# Patient Record
Sex: Female | Born: 1950
Health system: Southern US, Community
[De-identification: ages and names within clinical notes are randomized; demographics above are authoritative.]

## PROBLEM LIST (undated history)

## (undated) DIAGNOSIS — Z9289 Personal history of other medical treatment: Secondary | ICD-10-CM

## (undated) DIAGNOSIS — E039 Hypothyroidism, unspecified: Secondary | ICD-10-CM

## (undated) DIAGNOSIS — K219 Gastro-esophageal reflux disease without esophagitis: Secondary | ICD-10-CM

## (undated) DIAGNOSIS — I2510 Atherosclerotic heart disease of native coronary artery without angina pectoris: Secondary | ICD-10-CM

## (undated) DIAGNOSIS — F419 Anxiety disorder, unspecified: Secondary | ICD-10-CM

## (undated) DIAGNOSIS — K449 Diaphragmatic hernia without obstruction or gangrene: Secondary | ICD-10-CM

## (undated) DIAGNOSIS — I10 Essential (primary) hypertension: Secondary | ICD-10-CM

## (undated) DIAGNOSIS — M1990 Unspecified osteoarthritis, unspecified site: Secondary | ICD-10-CM

## (undated) DIAGNOSIS — G4730 Sleep apnea, unspecified: Secondary | ICD-10-CM

## (undated) DIAGNOSIS — I429 Cardiomyopathy, unspecified: Secondary | ICD-10-CM

## (undated) DIAGNOSIS — R55 Syncope and collapse: Secondary | ICD-10-CM

## (undated) DIAGNOSIS — R001 Bradycardia, unspecified: Secondary | ICD-10-CM

## (undated) DIAGNOSIS — E785 Hyperlipidemia, unspecified: Secondary | ICD-10-CM

## (undated) HISTORY — DX: Anxiety disorder, unspecified: F41.9

## (undated) HISTORY — DX: Personal history of other medical treatment: Z92.89

## (undated) HISTORY — DX: Cardiomyopathy, unspecified: I42.9

## (undated) HISTORY — DX: Hypothyroidism, unspecified: E03.9

## (undated) HISTORY — PX: ABDOMINAL HYSTERECTOMY: SHX81

## (undated) HISTORY — PX: CHOLECYSTECTOMY: SHX55

## (undated) HISTORY — DX: Bradycardia, unspecified: R00.1

## (undated) HISTORY — DX: Essential (primary) hypertension: I10

## (undated) HISTORY — DX: Hyperlipidemia, unspecified: E78.5

## (undated) HISTORY — DX: Diaphragmatic hernia without obstruction or gangrene: K44.9

## (undated) HISTORY — DX: Syncope and collapse: R55

## (undated) HISTORY — DX: Atherosclerotic heart disease of native coronary artery without angina pectoris: I25.10

---

## 1996-01-10 HISTORY — PX: TOTAL VAGINAL HYSTERECTOMY: SHX2548

## 1996-01-10 HISTORY — PX: LUMBAR DISC SURGERY: SHX700

## 1998-03-10 ENCOUNTER — Ambulatory Visit (HOSPITAL_COMMUNITY): Admission: RE | Admit: 1998-03-10 | Discharge: 1998-03-10 | Payer: Self-pay | Admitting: Family Medicine

## 1998-04-02 ENCOUNTER — Ambulatory Visit (HOSPITAL_COMMUNITY): Admission: RE | Admit: 1998-04-02 | Discharge: 1998-04-03 | Payer: Self-pay | Admitting: Neurosurgery

## 1998-04-02 ENCOUNTER — Encounter: Payer: Self-pay | Admitting: Neurosurgery

## 1998-04-23 ENCOUNTER — Encounter: Payer: Self-pay | Admitting: Neurosurgery

## 1998-04-23 ENCOUNTER — Ambulatory Visit (HOSPITAL_COMMUNITY): Admission: RE | Admit: 1998-04-23 | Discharge: 1998-04-23 | Payer: Self-pay | Admitting: Neurosurgery

## 2004-10-05 ENCOUNTER — Emergency Department: Payer: Self-pay | Admitting: Emergency Medicine

## 2005-08-05 ENCOUNTER — Ambulatory Visit: Payer: Self-pay | Admitting: Internal Medicine

## 2006-01-09 HISTORY — PX: CARPAL TUNNEL RELEASE: SHX101

## 2006-07-03 ENCOUNTER — Ambulatory Visit: Payer: Self-pay | Admitting: Internal Medicine

## 2006-09-26 ENCOUNTER — Encounter: Payer: Self-pay | Admitting: Orthopedic Surgery

## 2006-10-10 ENCOUNTER — Encounter: Payer: Self-pay | Admitting: Orthopedic Surgery

## 2006-11-07 ENCOUNTER — Ambulatory Visit: Payer: Self-pay | Admitting: Internal Medicine

## 2006-11-10 ENCOUNTER — Encounter: Payer: Self-pay | Admitting: Orthopedic Surgery

## 2006-12-28 ENCOUNTER — Ambulatory Visit: Payer: Self-pay | Admitting: Internal Medicine

## 2007-07-15 ENCOUNTER — Ambulatory Visit: Payer: Self-pay | Admitting: Internal Medicine

## 2007-07-29 ENCOUNTER — Ambulatory Visit: Payer: Self-pay | Admitting: Orthopedic Surgery

## 2007-08-07 ENCOUNTER — Ambulatory Visit: Payer: Self-pay | Admitting: Orthopedic Surgery

## 2007-09-09 ENCOUNTER — Encounter: Payer: Self-pay | Admitting: Orthopedic Surgery

## 2007-09-10 ENCOUNTER — Encounter: Payer: Self-pay | Admitting: Orthopedic Surgery

## 2007-10-10 ENCOUNTER — Encounter: Payer: Self-pay | Admitting: Orthopedic Surgery

## 2007-11-10 ENCOUNTER — Encounter: Payer: Self-pay | Admitting: Orthopedic Surgery

## 2008-02-08 ENCOUNTER — Inpatient Hospital Stay: Payer: Self-pay | Admitting: Internal Medicine

## 2008-02-18 ENCOUNTER — Ambulatory Visit: Payer: Self-pay | Admitting: Internal Medicine

## 2008-10-03 ENCOUNTER — Emergency Department: Payer: Self-pay | Admitting: Emergency Medicine

## 2009-03-01 ENCOUNTER — Ambulatory Visit: Payer: Self-pay | Admitting: Rheumatology

## 2009-03-22 ENCOUNTER — Other Ambulatory Visit: Payer: Self-pay | Admitting: Internal Medicine

## 2009-08-09 ENCOUNTER — Other Ambulatory Visit: Payer: Self-pay | Admitting: Internal Medicine

## 2009-11-15 ENCOUNTER — Ambulatory Visit: Payer: Self-pay | Admitting: Gastroenterology

## 2009-11-17 LAB — PATHOLOGY REPORT

## 2010-07-14 ENCOUNTER — Emergency Department: Payer: Self-pay | Admitting: Emergency Medicine

## 2010-07-20 ENCOUNTER — Encounter: Payer: Self-pay | Admitting: Cardiology

## 2010-07-20 ENCOUNTER — Encounter: Payer: Self-pay | Admitting: *Deleted

## 2010-07-20 ENCOUNTER — Ambulatory Visit (INDEPENDENT_AMBULATORY_CARE_PROVIDER_SITE_OTHER): Payer: Medicare Other | Admitting: Cardiology

## 2010-07-20 VITALS — BP 134/82 | HR 62 | Ht 66.0 in | Wt 195.0 lb

## 2010-07-20 DIAGNOSIS — R079 Chest pain, unspecified: Secondary | ICD-10-CM

## 2010-07-20 DIAGNOSIS — M542 Cervicalgia: Secondary | ICD-10-CM

## 2010-07-20 DIAGNOSIS — I10 Essential (primary) hypertension: Secondary | ICD-10-CM | POA: Insufficient documentation

## 2010-07-20 NOTE — Assessment & Plan Note (Addendum)
Patient had an episode of severe neck pain associated with nausea/vomiting and diaphoresis.  It is possible that this episode may have been vasovagal as she had been standing for a few minutes.   However, the patient has no history of vasovagal symptoms.  Cardiac risk factors include HTN and hyperlipidemia.   I will get an ETT-myoview to risk stratify and hopefully rule out ischemia.  She will continue aspirin for now.

## 2010-07-20 NOTE — Patient Instructions (Signed)
You have been scheduled for a Treadmill Myoview at [facility]. Please refer to your instructions given in office.  Your physician recommends that you schedule a follow-up appointment in: 2 weeks with Dr. Mariah Milling

## 2010-07-20 NOTE — Progress Notes (Signed)
PCP: Cornelia Copa  60 yo with history of HTN and hyperlipidemia was sent for evaluation today after recent ER evaluation at [facility].  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  Her left arm tingled.  She had never had a sensation like this before. She was not lightheaded.  She did not pass out or feel palpitations.  No fever, diarrhea, or overt dehydration.  She was indoors and had been standing  In one place for less than 5 minutes.  The symptom complex lasted for 2-3 hours.  She went to the ER at [facility].  Evaluation including cardiac enyzmes and ECG was negative and she was sent home.    She has had no recurrence of the symptoms taking her to the ER.  She has good exercise tolerance in general and is able to weedeat, clean houses, and do moderate physical exertion without significant dyspnea or chest pain.   She is mildly short of breath if she climbs fast up a long flight of steps.  Patient does report a stress test 3-4 years ago in Dr. Fredna Dow office that she says was normal.   ECG: NSR, normal  Labs (7/12): K 4.7, creatinine 0.84, TSH normal, cardiac enzymes negative  PMH:  1. GERD 2. Hypothyroidism 3. HTN 4. Hyperlipidemia 5. OSA 6. Disc surgery 7. Hysterectomy 8. Cholecystectomy  SH: Lives in [place] with husband.  Nonsmoker, no ETOH.  She works full-time as a Advertising copywriter.   FH: Grandfather with gastric cancer, father with PCI (? Age)  ROS: All systems reviewed and negative except as per HPI.   Current Outpatient Prescriptions  Medication Sig Dispense Refill  . ALPRAZolam (XANAX) 0.5 MG tablet Take 0.5 mg by mouth at bedtime as needed.        Marland Kitchen aspirin 81 MG tablet Take 81 mg by mouth daily.        Marland Kitchen CALCIUM-MAGNESIUM-ZINC PO Take 2 tablets by mouth daily.        Marland Kitchen estrogen-methylTESTOSTERone (ESTRATEST HS) 0.625-1.25 MG per tablet Take 1 tablet by mouth  daily.        Marland Kitchen FLUoxetine (PROZAC) 20 MG capsule Take 1 tablet by mouth daily.      . furosemide (LASIX) 20 MG tablet Take 1 tablet by mouth daily.      Marland Kitchen levothyroxine (SYNTHROID, LEVOTHROID) 50 MCG tablet Take 1 tablet by mouth daily.      Marland Kitchen lisinopril (PRINIVIL,ZESTRIL) 10 MG tablet Take 1 tablet by mouth daily.      . meloxicam (MOBIC) 7.5 MG tablet Take 1 tablet by mouth daily.      . rosuvastatin (CRESTOR) 10 MG tablet Take 10 mg by mouth daily.          BP 134/82  Pulse 62  Ht 5\' 6"  (1.676 m)  Wt 195 lb (88.451 kg)  BMI 31.47 kg/m2 General: NAD Neck: No JVD, no thyromegaly or thyroid nodule.  Lungs: Clear to auscultation bilaterally with normal respiratory effort. CV: Nondisplaced PMI.  Heart regular S1/S2, no S3/S4, no murmur.  No peripheral edema.  No carotid bruit.  Normal pedal pulses.  Abdomen: Soft, nontender, no hepatosplenomegaly, no distention.  Skin: Intact without lesions or rashes.  Neurologic: Alert and oriented x 3.  Psych: Normal affect. Extremities: No clubbing or cyanosis.  HEENT: Normal.

## 2010-07-20 NOTE — Assessment & Plan Note (Signed)
BP seems reasonably well-controlled on lisinopril.

## 2010-07-21 ENCOUNTER — Encounter: Payer: Self-pay | Admitting: *Deleted

## 2010-07-25 ENCOUNTER — Other Ambulatory Visit: Payer: Self-pay | Admitting: Cardiovascular Disease

## 2010-07-25 ENCOUNTER — Telehealth: Payer: Self-pay | Admitting: *Deleted

## 2010-07-25 ENCOUNTER — Ambulatory Visit: Payer: Self-pay | Admitting: Cardiovascular Disease

## 2010-07-25 DIAGNOSIS — I10 Essential (primary) hypertension: Secondary | ICD-10-CM

## 2010-07-25 DIAGNOSIS — M542 Cervicalgia: Secondary | ICD-10-CM

## 2010-07-25 NOTE — Telephone Encounter (Signed)
Pt's insurance will not cover pt's myoview that was scheduled at [facility]. Pt will need either peer review or ETT done prior to her insurance authorizing myoview. Dr. Shirlee Latch ordered test, but pt will f/u with Dr. Mariah Milling afterwards, so per TG pt will have ETT in office scheduled. Notified pt's daughter Morrie Sheldon), and will schedule for this week.

## 2010-08-02 ENCOUNTER — Ambulatory Visit: Payer: Medicare Other | Admitting: Cardiovascular Disease

## 2010-08-02 ENCOUNTER — Other Ambulatory Visit: Payer: Self-pay

## 2010-08-02 DIAGNOSIS — R0602 Shortness of breath: Secondary | ICD-10-CM

## 2010-08-02 DIAGNOSIS — M542 Cervicalgia: Secondary | ICD-10-CM

## 2010-08-03 ENCOUNTER — Ambulatory Visit: Payer: Medicare Other | Admitting: Cardiovascular Disease

## 2010-08-04 ENCOUNTER — Telehealth: Payer: Self-pay | Admitting: *Deleted

## 2010-08-04 NOTE — Telephone Encounter (Signed)
Opened in error

## 2010-08-08 ENCOUNTER — Ambulatory Visit: Payer: Self-pay | Admitting: Cardiovascular Disease

## 2010-08-08 DIAGNOSIS — R079 Chest pain, unspecified: Secondary | ICD-10-CM

## 2010-08-11 ENCOUNTER — Telehealth: Payer: Self-pay | Admitting: *Deleted

## 2010-08-11 NOTE — Telephone Encounter (Signed)
Pt notified stress myoview was normal per Dr. Mariah Milling.

## 2010-08-23 ENCOUNTER — Encounter: Payer: Self-pay | Admitting: *Deleted

## 2010-10-12 ENCOUNTER — Ambulatory Visit: Payer: Self-pay | Admitting: Internal Medicine

## 2010-10-24 ENCOUNTER — Other Ambulatory Visit: Payer: Self-pay | Admitting: Internal Medicine

## 2010-12-20 ENCOUNTER — Ambulatory Visit: Payer: Self-pay | Admitting: Internal Medicine

## 2011-06-16 ENCOUNTER — Ambulatory Visit: Payer: Self-pay | Admitting: Internal Medicine

## 2011-06-16 LAB — CBC WITH DIFFERENTIAL/PLATELET
Basophil #: 0.1 10*3/uL (ref 0.0–0.1)
Basophil %: 0.9 %
Eosinophil #: 0.2 10*3/uL (ref 0.0–0.7)
Eosinophil %: 3.4 %
HCT: 40.2 % (ref 35.0–47.0)
HGB: 13.4 g/dL (ref 12.0–16.0)
Lymphocyte #: 1.5 10*3/uL (ref 1.0–3.6)
Lymphocyte %: 26.3 %
MCH: 32.9 pg (ref 26.0–34.0)
MCHC: 33.3 g/dL (ref 32.0–36.0)
MCV: 99 fL (ref 80–100)
Monocyte #: 0.5 x10 3/mm (ref 0.2–0.9)
Monocyte %: 8.7 %
Neutrophil #: 3.5 10*3/uL (ref 1.4–6.5)
Neutrophil %: 60.7 %
Platelet: 146 10*3/uL — ABNORMAL LOW (ref 150–440)
RBC: 4.07 10*6/uL (ref 3.80–5.20)
RDW: 13.7 % (ref 11.5–14.5)
WBC: 5.8 10*3/uL (ref 3.6–11.0)

## 2011-06-16 LAB — COMPREHENSIVE METABOLIC PANEL
Albumin: 3.9 g/dL (ref 3.4–5.0)
Alkaline Phosphatase: 99 U/L (ref 50–136)
Anion Gap: 9 (ref 7–16)
BUN: 15 mg/dL (ref 7–18)
Bilirubin,Total: 0.6 mg/dL (ref 0.2–1.0)
Calcium, Total: 8.9 mg/dL (ref 8.5–10.1)
Chloride: 106 mmol/L (ref 98–107)
Co2: 28 mmol/L (ref 21–32)
Creatinine: 0.93 mg/dL (ref 0.60–1.30)
EGFR (African American): >60
EGFR (Non-African Amer.): >60
Glucose: 86 mg/dL (ref 65–99)
Osmolality: 285 (ref 275–301)
Potassium: 4.3 mmol/L (ref 3.5–5.1)
SGOT(AST): 30 U/L (ref 15–37)
SGPT (ALT): 41 U/L
Sodium: 143 mmol/L (ref 136–145)
Total Protein: 7.3 g/dL (ref 6.4–8.2)

## 2011-06-16 LAB — URINALYSIS, COMPLETE
Bacteria: NONE SEEN
Bilirubin,UR: NEGATIVE
Blood: NEGATIVE
Glucose,UR: NEGATIVE mg/dL (ref 0–75)
Ketone: NEGATIVE
Nitrite: NEGATIVE
Ph: 6 (ref 4.5–8.0)
Protein: NEGATIVE
RBC,UR: 1 /HPF (ref 0–5)
Specific Gravity: 1.012 (ref 1.003–1.030)
Squamous Epithelial: 6
Transitional Epi: <1
WBC UR: 1 /HPF (ref 0–5)

## 2011-06-16 LAB — TSH: Thyroid Stimulating Horm: 5.47 u[IU]/mL — ABNORMAL HIGH

## 2011-06-17 LAB — URINE CULTURE

## 2011-08-23 ENCOUNTER — Other Ambulatory Visit: Payer: Self-pay | Admitting: Internal Medicine

## 2011-08-23 LAB — LIPID PANEL
Cholesterol: 193 mg/dL (ref 0–200)
HDL Cholesterol: 55 mg/dL (ref 40–60)
Ldl Cholesterol, Calc: 114 mg/dL — ABNORMAL HIGH (ref 0–100)
Triglycerides: 121 mg/dL (ref 0–200)
VLDL Cholesterol, Calc: 24 mg/dL (ref 5–40)

## 2011-12-25 ENCOUNTER — Ambulatory Visit: Payer: Self-pay | Admitting: Internal Medicine

## 2012-01-26 ENCOUNTER — Ambulatory Visit: Payer: Self-pay | Admitting: Specialist

## 2012-02-28 ENCOUNTER — Emergency Department: Payer: Self-pay | Admitting: Emergency Medicine

## 2012-02-28 LAB — CBC
HGB: 14 g/dL (ref 12.0–16.0)
MCH: 31.4 pg (ref 26.0–34.0)
MCV: 95 fL (ref 80–100)
Platelet: 186 10*3/uL (ref 150–440)
RBC: 4.46 10*6/uL (ref 3.80–5.20)
RDW: 13.8 % (ref 11.5–14.5)
WBC: 9.7 10*3/uL (ref 3.6–11.0)

## 2012-02-28 LAB — CK TOTAL AND CKMB (NOT AT ARMC): CK-MB: <0.5 ng/mL — ABNORMAL LOW (ref 0.5–3.6)

## 2012-02-28 LAB — BASIC METABOLIC PANEL
Anion Gap: 8 (ref 7–16)
BUN: 17 mg/dL (ref 7–18)
Creatinine: 1.1 mg/dL (ref 0.60–1.30)
EGFR (African American): >60
EGFR (Non-African Amer.): 54 — ABNORMAL LOW
Glucose: 94 mg/dL (ref 65–99)
Potassium: 4.5 mmol/L (ref 3.5–5.1)
Sodium: 139 mmol/L (ref 136–145)

## 2012-09-16 ENCOUNTER — Ambulatory Visit: Payer: Self-pay | Admitting: Internal Medicine

## 2012-09-16 LAB — BASIC METABOLIC PANEL
Anion Gap: 3 — ABNORMAL LOW (ref 7–16)
BUN: 7 mg/dL (ref 7–18)
Calcium, Total: 9.2 mg/dL (ref 8.5–10.1)
Chloride: 107 mmol/L (ref 98–107)
Co2: 30 mmol/L (ref 21–32)
Creatinine: 0.78 mg/dL (ref 0.60–1.30)
EGFR (African American): >60
EGFR (Non-African Amer.): >60
Glucose: 108 mg/dL — ABNORMAL HIGH (ref 65–99)
Osmolality: 278 (ref 275–301)
Potassium: 4 mmol/L (ref 3.5–5.1)
Sodium: 140 mmol/L (ref 136–145)

## 2012-09-16 LAB — CBC WITH DIFFERENTIAL/PLATELET
Basophil #: 0.1 10*3/uL (ref 0.0–0.1)
Basophil %: 1.1 %
Eosinophil #: 0.2 10*3/uL (ref 0.0–0.7)
Eosinophil %: 3.3 %
HCT: 38.8 % (ref 35.0–47.0)
HGB: 13.4 g/dL (ref 12.0–16.0)
Lymphocyte #: 1.5 10*3/uL (ref 1.0–3.6)
Lymphocyte %: 24.9 %
MCH: 32.4 pg (ref 26.0–34.0)
MCHC: 34.5 g/dL (ref 32.0–36.0)
MCV: 94 fL (ref 80–100)
Monocyte #: 0.5 x10 3/mm (ref 0.2–0.9)
Monocyte %: 8 %
Neutrophil #: 3.8 10*3/uL (ref 1.4–6.5)
Neutrophil %: 62.7 %
Platelet: 156 10*3/uL (ref 150–440)
RBC: 4.13 10*6/uL (ref 3.80–5.20)
RDW: 13.4 % (ref 11.5–14.5)
WBC: 6.1 10*3/uL (ref 3.6–11.0)

## 2012-09-16 LAB — TROPONIN I: Troponin-I: 0.04 ng/mL

## 2012-09-16 LAB — PRO B NATRIURETIC PEPTIDE: B-Type Natriuretic Peptide: 137 pg/mL — ABNORMAL HIGH (ref 0–125)

## 2012-09-16 LAB — CK: CK, Total: 688 U/L — ABNORMAL HIGH (ref 21–215)

## 2012-11-22 ENCOUNTER — Ambulatory Visit: Payer: Self-pay | Admitting: Internal Medicine

## 2013-11-14 ENCOUNTER — Ambulatory Visit: Payer: Self-pay | Admitting: Internal Medicine

## 2014-01-16 DIAGNOSIS — M5417 Radiculopathy, lumbosacral region: Secondary | ICD-10-CM | POA: Diagnosis not present

## 2014-01-16 DIAGNOSIS — M62830 Muscle spasm of back: Secondary | ICD-10-CM | POA: Diagnosis not present

## 2014-01-16 DIAGNOSIS — M5136 Other intervertebral disc degeneration, lumbar region: Secondary | ICD-10-CM | POA: Insufficient documentation

## 2014-01-16 DIAGNOSIS — M5412 Radiculopathy, cervical region: Secondary | ICD-10-CM | POA: Diagnosis not present

## 2014-01-16 DIAGNOSIS — M47816 Spondylosis without myelopathy or radiculopathy, lumbar region: Secondary | ICD-10-CM | POA: Insufficient documentation

## 2014-01-16 DIAGNOSIS — M51369 Other intervertebral disc degeneration, lumbar region without mention of lumbar back pain or lower extremity pain: Secondary | ICD-10-CM | POA: Insufficient documentation

## 2014-01-17 DIAGNOSIS — M62830 Muscle spasm of back: Secondary | ICD-10-CM | POA: Insufficient documentation

## 2014-03-27 DIAGNOSIS — K219 Gastro-esophageal reflux disease without esophagitis: Secondary | ICD-10-CM | POA: Diagnosis not present

## 2014-03-27 DIAGNOSIS — H2513 Age-related nuclear cataract, bilateral: Secondary | ICD-10-CM | POA: Diagnosis not present

## 2014-03-27 DIAGNOSIS — Z0000 Encounter for general adult medical examination without abnormal findings: Secondary | ICD-10-CM | POA: Diagnosis not present

## 2014-03-27 DIAGNOSIS — F064 Anxiety disorder due to known physiological condition: Secondary | ICD-10-CM | POA: Diagnosis not present

## 2014-03-27 DIAGNOSIS — E8881 Metabolic syndrome: Secondary | ICD-10-CM | POA: Diagnosis not present

## 2014-04-10 DIAGNOSIS — Z1231 Encounter for screening mammogram for malignant neoplasm of breast: Secondary | ICD-10-CM | POA: Diagnosis not present

## 2014-04-22 DIAGNOSIS — G4733 Obstructive sleep apnea (adult) (pediatric): Secondary | ICD-10-CM | POA: Diagnosis not present

## 2014-05-13 DIAGNOSIS — M5417 Radiculopathy, lumbosacral region: Secondary | ICD-10-CM | POA: Diagnosis not present

## 2014-05-13 DIAGNOSIS — M5412 Radiculopathy, cervical region: Secondary | ICD-10-CM | POA: Diagnosis not present

## 2014-05-13 DIAGNOSIS — M5136 Other intervertebral disc degeneration, lumbar region: Secondary | ICD-10-CM | POA: Diagnosis not present

## 2014-05-14 DIAGNOSIS — M5136 Other intervertebral disc degeneration, lumbar region: Secondary | ICD-10-CM | POA: Diagnosis not present

## 2014-05-14 DIAGNOSIS — M5417 Radiculopathy, lumbosacral region: Secondary | ICD-10-CM | POA: Diagnosis not present

## 2014-05-22 DIAGNOSIS — L820 Inflamed seborrheic keratosis: Secondary | ICD-10-CM | POA: Diagnosis not present

## 2014-05-22 DIAGNOSIS — G4733 Obstructive sleep apnea (adult) (pediatric): Secondary | ICD-10-CM | POA: Diagnosis not present

## 2014-05-22 DIAGNOSIS — L280 Lichen simplex chronicus: Secondary | ICD-10-CM | POA: Diagnosis not present

## 2014-06-19 DIAGNOSIS — M5416 Radiculopathy, lumbar region: Secondary | ICD-10-CM | POA: Diagnosis not present

## 2014-06-19 DIAGNOSIS — M5136 Other intervertebral disc degeneration, lumbar region: Secondary | ICD-10-CM | POA: Diagnosis not present

## 2014-06-22 DIAGNOSIS — G4733 Obstructive sleep apnea (adult) (pediatric): Secondary | ICD-10-CM | POA: Diagnosis not present

## 2014-07-17 ENCOUNTER — Other Ambulatory Visit: Payer: Self-pay | Admitting: Physical Medicine and Rehabilitation

## 2014-07-17 DIAGNOSIS — M5136 Other intervertebral disc degeneration, lumbar region: Secondary | ICD-10-CM | POA: Diagnosis not present

## 2014-07-17 DIAGNOSIS — M5412 Radiculopathy, cervical region: Secondary | ICD-10-CM | POA: Diagnosis not present

## 2014-07-17 DIAGNOSIS — M5416 Radiculopathy, lumbar region: Secondary | ICD-10-CM | POA: Diagnosis not present

## 2014-07-22 DIAGNOSIS — G4733 Obstructive sleep apnea (adult) (pediatric): Secondary | ICD-10-CM | POA: Diagnosis not present

## 2014-07-24 ENCOUNTER — Ambulatory Visit
Admission: RE | Admit: 2014-07-24 | Discharge: 2014-07-24 | Disposition: A | Discharge status: Home / Self Care | Payer: Medicare Other | Source: Ambulatory Visit | Attending: Physical Medicine and Rehabilitation | Admitting: Physical Medicine and Rehabilitation

## 2014-07-24 DIAGNOSIS — M5020 Other cervical disc displacement, unspecified cervical region: Secondary | ICD-10-CM | POA: Diagnosis not present

## 2014-07-24 DIAGNOSIS — M5412 Radiculopathy, cervical region: Secondary | ICD-10-CM | POA: Diagnosis not present

## 2014-07-24 DIAGNOSIS — M4802 Spinal stenosis, cervical region: Secondary | ICD-10-CM | POA: Insufficient documentation

## 2014-07-29 DIAGNOSIS — E038 Other specified hypothyroidism: Secondary | ICD-10-CM | POA: Diagnosis not present

## 2014-07-29 DIAGNOSIS — K219 Gastro-esophageal reflux disease without esophagitis: Secondary | ICD-10-CM | POA: Diagnosis not present

## 2014-07-29 DIAGNOSIS — N3000 Acute cystitis without hematuria: Secondary | ICD-10-CM | POA: Diagnosis not present

## 2014-07-29 DIAGNOSIS — N390 Urinary tract infection, site not specified: Secondary | ICD-10-CM | POA: Diagnosis not present

## 2014-08-22 DIAGNOSIS — G4733 Obstructive sleep apnea (adult) (pediatric): Secondary | ICD-10-CM | POA: Diagnosis not present

## 2014-09-22 DIAGNOSIS — G4733 Obstructive sleep apnea (adult) (pediatric): Secondary | ICD-10-CM | POA: Diagnosis not present

## 2014-09-29 DIAGNOSIS — J329 Chronic sinusitis, unspecified: Secondary | ICD-10-CM | POA: Diagnosis not present

## 2014-09-29 DIAGNOSIS — K219 Gastro-esophageal reflux disease without esophagitis: Secondary | ICD-10-CM | POA: Diagnosis not present

## 2014-09-29 DIAGNOSIS — G5600 Carpal tunnel syndrome, unspecified upper limb: Secondary | ICD-10-CM | POA: Diagnosis not present

## 2014-09-29 DIAGNOSIS — E8881 Metabolic syndrome: Secondary | ICD-10-CM | POA: Diagnosis not present

## 2014-10-05 DIAGNOSIS — G5602 Carpal tunnel syndrome, left upper limb: Secondary | ICD-10-CM | POA: Diagnosis not present

## 2014-10-22 DIAGNOSIS — G4733 Obstructive sleep apnea (adult) (pediatric): Secondary | ICD-10-CM | POA: Diagnosis not present

## 2014-11-22 DIAGNOSIS — G4733 Obstructive sleep apnea (adult) (pediatric): Secondary | ICD-10-CM | POA: Diagnosis not present

## 2014-12-09 DIAGNOSIS — J0140 Acute pansinusitis, unspecified: Secondary | ICD-10-CM | POA: Diagnosis not present

## 2014-12-09 DIAGNOSIS — I10 Essential (primary) hypertension: Secondary | ICD-10-CM | POA: Diagnosis not present

## 2014-12-09 DIAGNOSIS — E784 Other hyperlipidemia: Secondary | ICD-10-CM | POA: Diagnosis not present

## 2014-12-09 DIAGNOSIS — E8881 Metabolic syndrome: Secondary | ICD-10-CM | POA: Diagnosis not present

## 2014-12-09 DIAGNOSIS — R5381 Other malaise: Secondary | ICD-10-CM | POA: Diagnosis not present

## 2014-12-09 DIAGNOSIS — J019 Acute sinusitis, unspecified: Secondary | ICD-10-CM | POA: Diagnosis not present

## 2014-12-10 DIAGNOSIS — M5416 Radiculopathy, lumbar region: Secondary | ICD-10-CM | POA: Insufficient documentation

## 2014-12-10 DIAGNOSIS — M5030 Other cervical disc degeneration, unspecified cervical region: Secondary | ICD-10-CM | POA: Diagnosis not present

## 2014-12-10 DIAGNOSIS — M5412 Radiculopathy, cervical region: Secondary | ICD-10-CM | POA: Diagnosis not present

## 2014-12-10 DIAGNOSIS — M5136 Other intervertebral disc degeneration, lumbar region: Secondary | ICD-10-CM | POA: Diagnosis not present

## 2014-12-15 DIAGNOSIS — M5416 Radiculopathy, lumbar region: Secondary | ICD-10-CM | POA: Diagnosis not present

## 2014-12-15 DIAGNOSIS — M5136 Other intervertebral disc degeneration, lumbar region: Secondary | ICD-10-CM | POA: Diagnosis not present

## 2014-12-22 DIAGNOSIS — G4733 Obstructive sleep apnea (adult) (pediatric): Secondary | ICD-10-CM | POA: Diagnosis not present

## 2015-01-06 DIAGNOSIS — M5416 Radiculopathy, lumbar region: Secondary | ICD-10-CM | POA: Diagnosis not present

## 2015-01-06 DIAGNOSIS — M5136 Other intervertebral disc degeneration, lumbar region: Secondary | ICD-10-CM | POA: Diagnosis not present

## 2015-02-10 DIAGNOSIS — G5602 Carpal tunnel syndrome, left upper limb: Secondary | ICD-10-CM | POA: Diagnosis not present

## 2015-04-21 DIAGNOSIS — H2513 Age-related nuclear cataract, bilateral: Secondary | ICD-10-CM | POA: Diagnosis not present

## 2015-04-23 DIAGNOSIS — G4733 Obstructive sleep apnea (adult) (pediatric): Secondary | ICD-10-CM | POA: Diagnosis not present

## 2015-04-26 DIAGNOSIS — K219 Gastro-esophageal reflux disease without esophagitis: Secondary | ICD-10-CM | POA: Diagnosis not present

## 2015-04-26 DIAGNOSIS — E038 Other specified hypothyroidism: Secondary | ICD-10-CM | POA: Diagnosis not present

## 2015-05-02 DIAGNOSIS — R05 Cough: Secondary | ICD-10-CM | POA: Diagnosis not present

## 2015-05-02 DIAGNOSIS — J019 Acute sinusitis, unspecified: Secondary | ICD-10-CM | POA: Diagnosis not present

## 2015-10-12 DIAGNOSIS — L570 Actinic keratosis: Secondary | ICD-10-CM | POA: Diagnosis not present

## 2015-10-25 DIAGNOSIS — G4733 Obstructive sleep apnea (adult) (pediatric): Secondary | ICD-10-CM | POA: Diagnosis not present

## 2015-10-25 DIAGNOSIS — Z23 Encounter for immunization: Secondary | ICD-10-CM | POA: Diagnosis not present

## 2015-10-25 DIAGNOSIS — K219 Gastro-esophageal reflux disease without esophagitis: Secondary | ICD-10-CM | POA: Diagnosis not present

## 2015-10-25 DIAGNOSIS — E038 Other specified hypothyroidism: Secondary | ICD-10-CM | POA: Diagnosis not present

## 2016-01-10 DIAGNOSIS — M1990 Unspecified osteoarthritis, unspecified site: Secondary | ICD-10-CM

## 2016-01-10 HISTORY — DX: Unspecified osteoarthritis, unspecified site: M19.90

## 2016-01-11 DIAGNOSIS — R109 Unspecified abdominal pain: Secondary | ICD-10-CM | POA: Diagnosis not present

## 2016-01-14 DIAGNOSIS — K219 Gastro-esophageal reflux disease without esophagitis: Secondary | ICD-10-CM | POA: Diagnosis not present

## 2016-01-14 DIAGNOSIS — N390 Urinary tract infection, site not specified: Secondary | ICD-10-CM | POA: Diagnosis not present

## 2016-01-14 DIAGNOSIS — M5440 Lumbago with sciatica, unspecified side: Secondary | ICD-10-CM | POA: Diagnosis not present

## 2016-01-17 DIAGNOSIS — M5136 Other intervertebral disc degeneration, lumbar region: Secondary | ICD-10-CM | POA: Diagnosis not present

## 2016-01-17 DIAGNOSIS — M5412 Radiculopathy, cervical region: Secondary | ICD-10-CM | POA: Diagnosis not present

## 2016-01-17 DIAGNOSIS — M5416 Radiculopathy, lumbar region: Secondary | ICD-10-CM | POA: Diagnosis not present

## 2016-01-17 DIAGNOSIS — M5030 Other cervical disc degeneration, unspecified cervical region: Secondary | ICD-10-CM | POA: Diagnosis not present

## 2016-01-19 DIAGNOSIS — M5136 Other intervertebral disc degeneration, lumbar region: Secondary | ICD-10-CM | POA: Diagnosis not present

## 2016-01-19 DIAGNOSIS — M5416 Radiculopathy, lumbar region: Secondary | ICD-10-CM | POA: Diagnosis not present

## 2016-02-03 DIAGNOSIS — E038 Other specified hypothyroidism: Secondary | ICD-10-CM | POA: Diagnosis not present

## 2016-02-03 DIAGNOSIS — R51 Headache: Secondary | ICD-10-CM | POA: Diagnosis not present

## 2016-02-03 DIAGNOSIS — R5382 Chronic fatigue, unspecified: Secondary | ICD-10-CM | POA: Diagnosis not present

## 2016-02-09 DIAGNOSIS — M5136 Other intervertebral disc degeneration, lumbar region: Secondary | ICD-10-CM | POA: Diagnosis not present

## 2016-02-09 DIAGNOSIS — M5416 Radiculopathy, lumbar region: Secondary | ICD-10-CM | POA: Diagnosis not present

## 2016-03-22 DIAGNOSIS — M5136 Other intervertebral disc degeneration, lumbar region: Secondary | ICD-10-CM | POA: Diagnosis not present

## 2016-03-22 DIAGNOSIS — M5416 Radiculopathy, lumbar region: Secondary | ICD-10-CM | POA: Diagnosis not present

## 2016-04-03 DIAGNOSIS — L570 Actinic keratosis: Secondary | ICD-10-CM | POA: Diagnosis not present

## 2016-04-24 DIAGNOSIS — G4733 Obstructive sleep apnea (adult) (pediatric): Secondary | ICD-10-CM | POA: Diagnosis not present

## 2016-05-07 DIAGNOSIS — S0101XA Laceration without foreign body of scalp, initial encounter: Secondary | ICD-10-CM | POA: Diagnosis not present

## 2016-05-07 DIAGNOSIS — W228XXA Striking against or struck by other objects, initial encounter: Secondary | ICD-10-CM | POA: Diagnosis not present

## 2016-05-07 DIAGNOSIS — Z882 Allergy status to sulfonamides status: Secondary | ICD-10-CM | POA: Diagnosis not present

## 2016-05-07 DIAGNOSIS — Z886 Allergy status to analgesic agent status: Secondary | ICD-10-CM | POA: Diagnosis not present

## 2016-05-18 DIAGNOSIS — Z4802 Encounter for removal of sutures: Secondary | ICD-10-CM | POA: Diagnosis not present

## 2016-05-18 DIAGNOSIS — S060X9A Concussion with loss of consciousness of unspecified duration, initial encounter: Secondary | ICD-10-CM | POA: Diagnosis not present

## 2016-08-04 DIAGNOSIS — H35371 Puckering of macula, right eye: Secondary | ICD-10-CM | POA: Diagnosis not present

## 2016-08-07 DIAGNOSIS — K219 Gastro-esophageal reflux disease without esophagitis: Secondary | ICD-10-CM | POA: Diagnosis not present

## 2016-08-07 DIAGNOSIS — H8309 Labyrinthitis, unspecified ear: Secondary | ICD-10-CM | POA: Diagnosis not present

## 2016-08-07 DIAGNOSIS — H6693 Otitis media, unspecified, bilateral: Secondary | ICD-10-CM | POA: Diagnosis not present

## 2016-08-11 ENCOUNTER — Other Ambulatory Visit: Payer: Self-pay | Admitting: Physical Medicine and Rehabilitation

## 2016-08-11 DIAGNOSIS — M5416 Radiculopathy, lumbar region: Secondary | ICD-10-CM

## 2016-08-17 ENCOUNTER — Ambulatory Visit
Admission: RE | Admit: 2016-08-17 | Discharge: 2016-08-17 | Disposition: A | Discharge status: Home / Self Care | Payer: Medicare Other | Source: Ambulatory Visit | Attending: Physical Medicine and Rehabilitation | Admitting: Physical Medicine and Rehabilitation

## 2016-08-17 DIAGNOSIS — M5126 Other intervertebral disc displacement, lumbar region: Secondary | ICD-10-CM | POA: Diagnosis not present

## 2016-08-17 DIAGNOSIS — M48061 Spinal stenosis, lumbar region without neurogenic claudication: Secondary | ICD-10-CM | POA: Diagnosis not present

## 2016-08-17 DIAGNOSIS — M545 Low back pain: Secondary | ICD-10-CM | POA: Diagnosis not present

## 2016-08-17 DIAGNOSIS — M5416 Radiculopathy, lumbar region: Secondary | ICD-10-CM | POA: Diagnosis not present

## 2016-08-23 DIAGNOSIS — G5602 Carpal tunnel syndrome, left upper limb: Secondary | ICD-10-CM | POA: Diagnosis not present

## 2016-08-27 DIAGNOSIS — E039 Hypothyroidism, unspecified: Secondary | ICD-10-CM | POA: Diagnosis not present

## 2016-08-27 DIAGNOSIS — H538 Other visual disturbances: Secondary | ICD-10-CM | POA: Diagnosis not present

## 2016-08-27 DIAGNOSIS — K449 Diaphragmatic hernia without obstruction or gangrene: Secondary | ICD-10-CM | POA: Diagnosis not present

## 2016-08-27 DIAGNOSIS — R0789 Other chest pain: Secondary | ICD-10-CM | POA: Diagnosis not present

## 2016-08-27 DIAGNOSIS — Z885 Allergy status to narcotic agent status: Secondary | ICD-10-CM | POA: Diagnosis not present

## 2016-08-27 DIAGNOSIS — R001 Bradycardia, unspecified: Secondary | ICD-10-CM | POA: Diagnosis not present

## 2016-08-27 DIAGNOSIS — R531 Weakness: Secondary | ICD-10-CM | POA: Diagnosis not present

## 2016-08-27 DIAGNOSIS — I10 Essential (primary) hypertension: Secondary | ICD-10-CM | POA: Diagnosis not present

## 2016-08-27 DIAGNOSIS — R079 Chest pain, unspecified: Secondary | ICD-10-CM | POA: Diagnosis not present

## 2016-08-27 DIAGNOSIS — R0602 Shortness of breath: Secondary | ICD-10-CM | POA: Diagnosis not present

## 2016-08-27 DIAGNOSIS — R51 Headache: Secondary | ICD-10-CM | POA: Diagnosis not present

## 2016-08-27 DIAGNOSIS — R42 Dizziness and giddiness: Secondary | ICD-10-CM | POA: Diagnosis not present

## 2016-08-27 DIAGNOSIS — Z7982 Long term (current) use of aspirin: Secondary | ICD-10-CM | POA: Diagnosis not present

## 2016-08-27 DIAGNOSIS — Z882 Allergy status to sulfonamides status: Secondary | ICD-10-CM | POA: Diagnosis not present

## 2016-08-31 ENCOUNTER — Ambulatory Visit (INDEPENDENT_AMBULATORY_CARE_PROVIDER_SITE_OTHER): Payer: Medicare Other | Admitting: Cardiovascular Disease

## 2016-08-31 ENCOUNTER — Encounter: Payer: Self-pay | Admitting: Cardiovascular Disease

## 2016-08-31 VITALS — BP 139/88 | HR 53 | Ht 66.0 in | Wt 204.0 lb

## 2016-08-31 DIAGNOSIS — R0602 Shortness of breath: Secondary | ICD-10-CM

## 2016-08-31 DIAGNOSIS — E785 Hyperlipidemia, unspecified: Secondary | ICD-10-CM

## 2016-08-31 DIAGNOSIS — I10 Essential (primary) hypertension: Secondary | ICD-10-CM | POA: Diagnosis not present

## 2016-08-31 DIAGNOSIS — R079 Chest pain, unspecified: Secondary | ICD-10-CM

## 2016-08-31 NOTE — Patient Instructions (Addendum)
Medication Instructions:  Your physician recommends that you continue on your current medications as directed. Please refer to the Current Medication list given to you today.   Labwork: none  Testing/Procedures: Your physician has requested that you have an echocardiogram. Echocardiography is a painless test that uses sound waves to create images of your heart. It provides your doctor with information about the size and shape of your heart and how well your heart's chambers and valves are working. This procedure takes approximately one hour. There are no restrictions for this procedure.  Your physician has requested that you have a lexiscan myoview. For further information please visit [email or web address]. Please follow instruction sheet, as given.  [place]  Your caregiver has ordered a Stress Test with nuclear imaging. The purpose of this test is to evaluate the blood supply to your heart muscle. This procedure is referred to as a "Non-Invasive Stress Test." This is because other than having an IV started in your vein, nothing is inserted or "invades" your body. Cardiac stress tests are done to find areas of poor blood flow to the heart by determining the extent of coronary artery disease (CAD). Some patients exercise on a treadmill, which naturally increases the blood flow to your heart, while others who are  unable to walk on a treadmill due to physical limitations have a pharmacologic/chemical stress agent called Lexiscan . This medicine will mimic walking on a treadmill by temporarily increasing your coronary blood flow.   Please note: these test may take anywhere between 2-4 hours to complete  PLEASE REPORT TO [place] AT THE FIRST DESK WILL DIRECT YOU WHERE TO GO  Date of Procedure:________08/29/18_________  Arrival Time for Procedure:______07:15 am________   Instructions regarding medication:   You may take your usual morning medications with  a small sip of water.     PLEASE NOTIFY THE OFFICE AT LEAST 27 HOURS IN ADVANCE IF YOU ARE UNABLE TO KEEP YOUR APPOINTMENT.  [phone] AND  PLEASE NOTIFY NUCLEAR MEDICINE AT [facility] AT LEAST 24 HOURS IN ADVANCE IF YOU ARE UNABLE TO KEEP YOUR APPOINTMENT. [phone]  How to prepare for your Myoview test:  1. Do not eat or drink after midnight 2. No caffeine for 24 hours prior to test 3. No smoking 24 hours prior to test. 4. Your medication may be taken with water.  If your doctor stopped a medication because of this test, do not take that medication. 5. Ladies, please do not wear dresses.  Skirts or pants are appropriate. Please wear a short sleeve shirt. 6. No perfume, cologne or lotion. 7. Wear comfortable walking shoes. No heels!       Follow-Up: Your physician recommends that you schedule a follow-up appointment in: [place].    Echocardiogram An echocardiogram, or echocardiography, uses sound waves (ultrasound) to produce an image of your heart. The echocardiogram is simple, painless, obtained within a short period of time, and offers valuable information to your health care provider. The images from an echocardiogram can provide information such as:  Evidence of coronary artery disease (CAD).  Heart size.  Heart muscle function.  Heart valve function.  Aneurysm detection.  Evidence of a past heart attack.  Fluid buildup around the heart.  Heart muscle thickening.  Assess heart valve function.  Tell a health care provider about:  Any allergies you have.  All medicines you are taking, including vitamins, herbs, eye drops, creams, and over-the-counter medicines.  Any problems you  or family members have had with anesthetic medicines.  Any blood disorders you have.  Any surgeries you have had.  Any medical conditions you have.  Whether you are pregnant or may be pregnant. What happens before the procedure? No special preparation is needed.  Eat and drink normally. What happens during the procedure?  In order to produce an image of your heart, gel will be applied to your chest and a wand-like tool (transducer) will be moved over your chest. The gel will help transmit the sound waves from the transducer. The sound waves will harmlessly bounce off your heart to allow the heart images to be captured in real-time motion. These images will then be recorded.  You may need an IV to receive a medicine that improves the quality of the pictures. What happens after the procedure? You may return to your normal schedule including diet, activities, and medicines, unless your health care provider tells you otherwise. This information is not intended to replace advice given to you by your health care provider. Make sure you discuss any questions you have with your health care provider. Document Released: 12/24/1999 Document Revised: 08/14/2015 Document Reviewed: 09/02/2012 Elsevier Interactive Patient Education  2017 [place].    Cardiac Nuclear Scan A cardiac nuclear scan is a test that measures blood flow to the heart when a person is resting and when he or she is exercising. The test looks for problems such as:  Not enough blood reaching a portion of the heart.  The heart muscle not working normally.  You may need this test if:  You have heart disease.  You have had abnormal lab results.  You have had heart surgery or angioplasty.  You have chest pain.  You have shortness of breath.  In this test, a radioactive dye (tracer) is injected into your bloodstream. After the tracer has traveled to your heart, an imaging device is used to measure how much of the tracer is absorbed by or distributed to various areas of your heart. This procedure is usually done at a hospital and takes 2-4 hours. Tell a health care provider about:  Any allergies you have.  All medicines you are taking, including vitamins, herbs, eye drops, creams,  and over-the-counter medicines.  Any problems you or family members have had with the use of anesthetic medicines.  Any blood disorders you have.  Any surgeries you have had.  Any medical conditions you have.  Whether you are pregnant or may be pregnant. What are the risks? Generally, this is a safe procedure. However, problems may occur, including:  Serious chest pain and heart attack. This is only a risk if the stress portion of the test is done.  Rapid heartbeat.  Sensation of warmth in your chest. This usually passes quickly.  What happens before the procedure?  Ask your health care provider about changing or stopping your regular medicines. This is especially important if you are taking diabetes medicines or blood thinners.  Remove your jewelry on the day of the procedure. What happens during the procedure?  An IV tube will be inserted into one of your veins.  Your health care provider will inject a small amount of radioactive tracer through the tube.  You will wait for 20-40 minutes while the tracer travels through your bloodstream.  Your heart activity will be monitored with an electrocardiogram (ECG).  You will lie down on an exam table.  Images of your heart will be taken for about 15-20 minutes.  You  may be asked to exercise on a treadmill or stationary bike. While you exercise, your heart's activity will be monitored with an ECG, and your blood pressure will be checked. If you are unable to exercise, you may be given a medicine to increase blood flow to parts of your heart.  When blood flow to your heart has peaked, a tracer will again be injected through the IV tube.  After 20-40 minutes, you will get back on the exam table and have more images taken of your heart.  When the procedure is over, your IV tube will be removed. The procedure may vary among health care providers and hospitals. Depending on the type of tracer used, scans may need to be repeated 3-4  hours later. What happens after the procedure?  Unless your health care provider tells you otherwise, you may return to your normal schedule, including diet, activities, and medicines.  Unless your health care provider tells you otherwise, you may increase your fluid intake. This will help flush the contrast dye from your body. Drink enough fluid to keep your urine clear or pale yellow.  It is up to you to get your test results. Ask your health care provider, or the department that is doing the test, when your results will be ready. Summary  A cardiac nuclear scan measures the blood flow to the heart when a person is resting and when he or she is exercising.  You may need this test if you are at risk for heart disease.  Tell your health care provider if you are pregnant.  Unless your health care provider tells you otherwise, increase your fluid intake. This will help flush the contrast dye from your body. Drink enough fluid to keep your urine clear or pale yellow. This information is not intended to replace advice given to you by your health care provider. Make sure you discuss any questions you have with your health care provider. Document Released: 01/21/2004 Document Revised: 12/29/2015 Document Reviewed: 12/04/2012 Elsevier Interactive Patient Education  2017 [employer].

## 2016-08-31 NOTE — Progress Notes (Signed)
Cardiology Office Note   Date:  08/31/2016   ID:  Melissa English, DOB [birth year], MRN [id number]  PCP:  Cletis Athens, MD  Cardiologist:   Kathlyn Sacramento, MD   Chief Complaint  Patient presents with  . OTHER    ED f/u chest pain c/o sob with exertion. Meds reviewed verbally with pt.      History of Present Illness: Melissa English is a 66 y.o. female who presents for Evaluation of chest pain. She has known history of hypertension, hyperlipidemia, sleep apnea on CPAP and hypothyroidism. She was seen recently at [facility] urgent care for chest pain. She was referred to the emergency room from their given persistent chest pain and shortness of breath. Blood pressure was running low that day at 94/60. She went to [facility] emergency room. Troponin was negative. D-dimer was mildly elevated but  CTA was negative for pulmonary embolism. The rest of her labs were unremarkable. She reports that over the last year, she has experienced progressive shortness of breath where she gives out very easily even with regular walking. Occasionally this is associated with chest tightness but not very often. The chest pain she had recently was sharp-like needle lasting for a few seconds. She reports chronic asymptomatic bradycardia. She suffers from low back pain which limits her physical activities. She has no previous lung disease and she is a lifelong nonsmoker. There is no family history of premature coronary artery disease.    Past Medical History:  Diagnosis Date  . Anxiety   . HLD (hyperlipidemia)   . HTN (hypertension)   . Hypothyroidism     Past Surgical History:  Procedure Laterality Date  . CARPAL TUNNEL RELEASE     right  . CHOLECYSTECTOMY    . LUMBAR DISC SURGERY     L4 - L5  . TOTAL VAGINAL HYSTERECTOMY       Current Outpatient Prescriptions  Medication Sig Dispense Refill  . ALPRAZolam (XANAX) 0.5 MG tablet Take 0.5 mg by mouth at bedtime as needed.      Marland Kitchen aspirin 81 MG tablet Take 81 mg  by mouth daily.      Marland Kitchen CALCIUM-MAGNESIUM-ZINC PO Take 2 tablets by mouth daily.      Marland Kitchen FLUoxetine (PROZAC) 20 MG capsule Take 1 tablet by mouth daily.    Marland Kitchen levothyroxine (SYNTHROID, LEVOTHROID) 88 MCG tablet Take 88 mcg by mouth daily before breakfast.    . lisinopril (PRINIVIL,ZESTRIL) 10 MG tablet Take 1 tablet by mouth daily.    . meloxicam (MOBIC) 7.5 MG tablet Take 1 tablet by mouth daily.    . rosuvastatin (CRESTOR) 10 MG tablet Take 10 mg by mouth daily.       No current facility-administered medications for this visit.     Allergies:   Codeine and Sulfa antibiotics    Social History:  The patient  reports that she has never smoked. She has never used smokeless tobacco. She reports that she drinks alcohol. She reports that she does not use drugs.   Family History:  The patient's family history is negative for coronary artery disease or sudden death.   ROS:  Please see the history of present illness.   Otherwise, review of systems are positive for none.   All other systems are reviewed and negative.    PHYSICAL EXAM: VS:  BP 139/88 (BP Location: Left Arm, Patient Position: Sitting, Cuff Size: Normal)   Ht 5\' 6"  (1.676 m)   Wt 204 lb (92.5 kg)  BMI 32.93 kg/m  , BMI Body mass index is 32.93 kg/m. GEN: Well nourished, well developed, in no acute distress  HEENT: normal  Neck: no JVD, carotid bruits, or masses Cardiac: RRR; no murmurs, rubs, or gallops,no edema  Respiratory:  clear to auscultation bilaterally, normal work of breathing GI: soft, nontender, nondistended, + BS MS: no deformity or atrophy  Skin: warm and dry, no rash Neuro:  Strength and sensation are intact Psych: euthymic mood, full affect   EKG:  EKG is ordered today. The ekg ordered today demonstrates sinus bradycardia with first-degree AV block. Low voltage. No significant ST or T wave changes.   Recent Labs: No results found for requested labs within last 8760 hours.    Lipid Panel      Component Value Date/Time   CHOL 193 08/23/2011 0743   TRIG 121 08/23/2011 0743   HDL 55 08/23/2011 0743   VLDL 24 08/23/2011 0743   LDLCALC 114 (H) 08/23/2011 0743      Wt Readings from Last 3 Encounters:  08/31/16 204 lb (92.5 kg)  07/24/14 195 lb (88.5 kg)  07/20/10 195 lb (88.5 kg)        PAD Screen 08/31/2016  Previous PAD dx? No  Previous surgical procedure? No  Pain with walking? Yes  Subsides with rest? No  Feet/toe relief with dangling? No  Painful, non-healing ulcers? No  Extremities discolored? No      ASSESSMENT AND PLAN:  1.  Chest pain with some anginal and some atypical features: The recent sharp chest pain does not seem to be cardiac in likely musculoskeletal. However, the tightness feeling with activities is concerning and thus I recommend evaluation with a pharmacologic nuclear stress test. She is not able to exercise on a treadmill due to her back problems.  2. Significant exertional dyspnea: This has been progressive over the last year. I requested an echocardiogram. There is a possibility of gradual physical deconditioning. If cardiac testing is unremarkable, an exercise program would be recommended.  3. Essential hypertension: Blood pressure is controlled on lisinopril.  4. Hyperlipidemia: Currently on rosuvastatin 10 mg daily.    Disposition:   FU with me in 1 month  Signed,  Kathlyn Sacramento, MD  08/31/2016 7:56 AM     AFB

## 2016-09-06 ENCOUNTER — Encounter
Admission: RE | Admit: 2016-09-06 | Discharge: 2016-09-06 | Disposition: A | Discharge status: Home / Self Care | Payer: Medicare Other | Source: Ambulatory Visit | Attending: Cardiovascular Disease | Admitting: Cardiovascular Disease

## 2016-09-06 DIAGNOSIS — R079 Chest pain, unspecified: Secondary | ICD-10-CM | POA: Insufficient documentation

## 2016-09-06 DIAGNOSIS — R0602 Shortness of breath: Secondary | ICD-10-CM | POA: Diagnosis not present

## 2016-09-06 LAB — NM MYOCAR MULTI W/SPECT W/WALL MOTION / EF
CHL CUP NUCLEAR SDS: 1
CHL CUP RESTING HR STRESS: 58 {beats}/min
CHL CUP STRESS STAGE 1 GRADE: 0 %
CHL CUP STRESS STAGE 1 HR: 57 {beats}/min
CHL CUP STRESS STAGE 3 SPEED: 0 mph
CHL CUP STRESS STAGE 4 GRADE: 0 %
CHL CUP STRESS STAGE 4 HR: 75 {beats}/min
CHL CUP STRESS STAGE 4 SBP: 126 mmHg
CSEPHR: 58 %
CSEPPHR: 90 {beats}/min
CSEPPMHR: 58 %
Estimated workload: 1 METS
LV dias vol: 95 mL (ref 46–106)
LVSYSVOL: 35 mL
SRS: 0
SSS: 0
Stage 1 Speed: 0 mph
Stage 2 Grade: 0 %
Stage 2 HR: 57 {beats}/min
Stage 2 Speed: 0 mph
Stage 3 Grade: 0 %
Stage 3 HR: 90 {beats}/min
Stage 4 DBP: 71 mmHg
Stage 4 Speed: 0 mph
TID: 1.21

## 2016-09-06 MED ORDER — TECHNETIUM TC 99M TETROFOSMIN IV KIT
31.1280 | PACK | Freq: Once | INTRAVENOUS | Status: AC | PRN
  Administered 2016-09-06: 31.128 via INTRAVENOUS

## 2016-09-06 MED ORDER — TECHNETIUM TC 99M TETROFOSMIN IV KIT
13.0000 | PACK | Freq: Once | INTRAVENOUS | Status: AC | PRN
  Administered 2016-09-06: 12.77 via INTRAVENOUS

## 2016-09-06 MED ORDER — REGADENOSON 0.4 MG/5ML IV SOLN
0.4000 mg | Freq: Once | INTRAVENOUS | Status: AC
  Administered 2016-09-06: 0.4 mg via INTRAVENOUS

## 2016-09-12 ENCOUNTER — Other Ambulatory Visit: Payer: Self-pay

## 2016-09-12 ENCOUNTER — Ambulatory Visit (INDEPENDENT_AMBULATORY_CARE_PROVIDER_SITE_OTHER): Payer: Medicare Other

## 2016-09-12 DIAGNOSIS — R0602 Shortness of breath: Secondary | ICD-10-CM

## 2016-09-12 DIAGNOSIS — R079 Chest pain, unspecified: Secondary | ICD-10-CM | POA: Diagnosis not present

## 2016-09-14 DIAGNOSIS — M5126 Other intervertebral disc displacement, lumbar region: Secondary | ICD-10-CM | POA: Diagnosis not present

## 2016-09-14 DIAGNOSIS — M5416 Radiculopathy, lumbar region: Secondary | ICD-10-CM | POA: Diagnosis not present

## 2016-09-25 DIAGNOSIS — R918 Other nonspecific abnormal finding of lung field: Secondary | ICD-10-CM | POA: Diagnosis not present

## 2016-09-25 DIAGNOSIS — R05 Cough: Secondary | ICD-10-CM | POA: Diagnosis not present

## 2016-09-25 DIAGNOSIS — R062 Wheezing: Secondary | ICD-10-CM | POA: Diagnosis not present

## 2016-10-03 ENCOUNTER — Ambulatory Visit (INDEPENDENT_AMBULATORY_CARE_PROVIDER_SITE_OTHER): Payer: Medicare Other | Admitting: Cardiovascular Disease

## 2016-10-03 ENCOUNTER — Encounter: Payer: Self-pay | Admitting: Cardiovascular Disease

## 2016-10-03 VITALS — BP 110/70 | HR 61 | Ht 66.0 in | Wt 208.8 lb

## 2016-10-03 DIAGNOSIS — R079 Chest pain, unspecified: Secondary | ICD-10-CM

## 2016-10-03 DIAGNOSIS — E785 Hyperlipidemia, unspecified: Secondary | ICD-10-CM

## 2016-10-03 DIAGNOSIS — R0602 Shortness of breath: Secondary | ICD-10-CM

## 2016-10-03 DIAGNOSIS — I10 Essential (primary) hypertension: Secondary | ICD-10-CM | POA: Diagnosis not present

## 2016-10-03 NOTE — Patient Instructions (Signed)
Medication Instructions:  Your physician recommends that you continue on your current medications as directed. Please refer to the Current Medication list given to you today.   Labwork: none  Testing/Procedures: none  Follow-Up: Your physician recommends that you schedule a follow-up appointment in: 3 months with Dr. Fletcher Anon.    Any Other Special Instructions Will Be Listed Below (If Applicable). Your physician has recommended you establish care with a pulmonologist.      If you need a refill on your cardiac medications before your next appointment, please call your pharmacy.

## 2016-10-03 NOTE — Progress Notes (Signed)
Cardiology Office Note   Date:  10/03/2016   ID:  Melissa English, DOB 1951/01/05, MRN [id number]  PCP:  Cletis Athens, MD  Cardiologist:   Kathlyn Sacramento, MD   Chief Complaint  Patient presents with  . other    F/u myoview and echo. Meds reviewed verbally with pt.      History of Present Illness: Melissa English is a 66 y.o. female who presents for A follow-up visit regarding chest pain. She has known history of hypertension, hyperlipidemia, sleep apnea on CPAP and hypothyroidism. She was seen recently for atypical chest pain with emergency room visit.  Troponin was negative. D-dimer was mildly elevated but  CTA was negative for pulmonary embolism. The rest of her labs were unremarkable. She reported progressive exertional dyspnea with occasional chest tightness. She has chronic asymptomatic bradycardia and she suffers from low back pain.  She underwent a pharmacologic nuclear stress test which showed no evidence of ischemia or perfusion defects. Echocardiogram showed normal LV systolic function and no significant abnormalities. She continues to complain of significant dyspnea with increased cough and went to urgent care recently. She improved with prednisone.   Past Medical History:  Diagnosis Date  . Anxiety   . HLD (hyperlipidemia)   . HTN (hypertension)   . Hypothyroidism     Past Surgical History:  Procedure Laterality Date  . CARPAL TUNNEL RELEASE     right  . CHOLECYSTECTOMY    . LUMBAR DISC SURGERY     L4 - L5  . TOTAL VAGINAL HYSTERECTOMY       Current Outpatient Prescriptions  Medication Sig Dispense Refill  . ALPRAZolam (XANAX) 0.5 MG tablet Take 0.5 mg by mouth at bedtime as needed.      Marland Kitchen aspirin 81 MG tablet Take 81 mg by mouth daily.      Marland Kitchen CALCIUM-MAGNESIUM-ZINC PO Take 2 tablets by mouth daily.      Marland Kitchen FLUoxetine (PROZAC) 20 MG capsule Take 1 tablet by mouth daily.    Marland Kitchen levothyroxine (SYNTHROID, LEVOTHROID) 88 MCG tablet Take 88 mcg by mouth daily  before breakfast.    . lisinopril (PRINIVIL,ZESTRIL) 10 MG tablet Take 1 tablet by mouth daily.    . meloxicam (MOBIC) 7.5 MG tablet Take 1 tablet by mouth daily.    . rosuvastatin (CRESTOR) 10 MG tablet Take 10 mg by mouth daily.       No current facility-administered medications for this visit.     Allergies:   Codeine and Sulfa antibiotics    Social History:  The patient  reports that she has never smoked. She has never used smokeless tobacco. She reports that she drinks alcohol. She reports that she does not use drugs.   Family History:  The patient's family history is negative for coronary artery disease or sudden death.   ROS:  Please see the history of present illness.   Otherwise, review of systems are positive for none.   All other systems are reviewed and negative.    PHYSICAL EXAM: VS:  There were no vitals taken for this visit. , BMI There is no height or weight on file to calculate BMI. GEN: Well nourished, well developed, in no acute distress  HEENT: normal  Neck: no JVD, carotid bruits, or masses Cardiac: RRR; no murmurs, rubs, or gallops,no edema  Respiratory:  clear to auscultation bilaterally, normal work of breathing GI: soft, nontender, nondistended, + BS MS: no deformity or atrophy  Skin: warm and dry, no rash  Neuro:  Strength and sensation are intact Psych: euthymic mood, full affect   EKG:  EKG is ordered today. The ekg ordered today demonstrates sinus bradycardia with first-degree AV block. Low voltage. No significant ST or T wave changes.   Recent Labs: No results found for requested labs within last 8760 hours.    Lipid Panel    Component Value Date/Time   CHOL 193 08/23/2011 0743   TRIG 121 08/23/2011 0743   HDL 55 08/23/2011 0743   VLDL 24 08/23/2011 0743   LDLCALC 114 (H) 08/23/2011 0743      Wt Readings from Last 3 Encounters:  08/31/16 204 lb (92.5 kg)  07/24/14 195 lb (88.5 kg)  07/20/10 195 lb (88.5 kg)        PAD Screen  08/31/2016  Previous PAD dx? No  Previous surgical procedure? No  Pain with walking? Yes  Subsides with rest? No  Feet/toe relief with dangling? No  Painful, non-healing ulcers? No  Extremities discolored? No      ASSESSMENT AND PLAN:  1.  Chest pain with some anginal and some atypical features: Cardiac workup was negative including echocardiogram and stress test.  Left heart catheterization can be left as a last resort if no other explanation for her symptoms and negative pulmonary workup.   2. Significant exertional dyspnea: Unremarkable stress test and echocardiogram. I'm going to refer her to pulmonary for evaluation.  3. Essential hypertension: Blood pressure is controlled on lisinopril.  4. Hyperlipidemia: Currently on rosuvastatin 10 mg daily.    Disposition:   FU with me in 3 months  Signed,  Kathlyn Sacramento, MD  10/03/2016 1:45 PM    [facility]

## 2016-10-05 DIAGNOSIS — G5602 Carpal tunnel syndrome, left upper limb: Secondary | ICD-10-CM | POA: Diagnosis not present

## 2016-10-09 DIAGNOSIS — R0789 Other chest pain: Secondary | ICD-10-CM | POA: Diagnosis not present

## 2016-10-09 DIAGNOSIS — R0602 Shortness of breath: Secondary | ICD-10-CM | POA: Diagnosis not present

## 2016-10-09 DIAGNOSIS — R079 Chest pain, unspecified: Secondary | ICD-10-CM | POA: Diagnosis not present

## 2016-10-09 DIAGNOSIS — G4733 Obstructive sleep apnea (adult) (pediatric): Secondary | ICD-10-CM | POA: Diagnosis not present

## 2016-10-11 DIAGNOSIS — G5602 Carpal tunnel syndrome, left upper limb: Secondary | ICD-10-CM | POA: Diagnosis not present

## 2016-10-12 ENCOUNTER — Telehealth: Payer: Self-pay | Admitting: Cardiovascular Disease

## 2016-10-12 NOTE — Telephone Encounter (Signed)
Request for cardiac clearance received from [facility] for patients upcoming carpal tunnel surgery at [facility] on 10/25/16 with Dr. Earnestine Leys. Please review for clearance and patient is also on Aspirin 81 mg. Route clearance to Fax [phone]

## 2016-10-13 NOTE — Telephone Encounter (Signed)
Low risk . Can hold Aspirin 7 days before surgery.

## 2016-10-13 NOTE — Telephone Encounter (Signed)
Clearance routed to number provided.  

## 2016-10-16 ENCOUNTER — Telehealth: Payer: Self-pay | Admitting: Cardiovascular Disease

## 2016-10-16 NOTE — Telephone Encounter (Signed)
S/w pt. Cardiac clearance faxed to [facility] October 5. Pt aware

## 2016-10-16 NOTE — Telephone Encounter (Signed)
Pt is having carpel tunnel surgery on 10/17 with Dr. Sabra Heck, and needs clearance. Please call.

## 2016-10-17 ENCOUNTER — Inpatient Hospital Stay: Admission: RE | Admit: 2016-10-17 | Payer: Medicare Other | Source: Ambulatory Visit

## 2016-10-19 ENCOUNTER — Other Ambulatory Visit: Payer: Self-pay | Admitting: Specialist

## 2016-10-19 ENCOUNTER — Telehealth: Payer: Self-pay | Admitting: Cardiovascular Disease

## 2016-10-19 NOTE — Telephone Encounter (Signed)
Per Judeen Hammans at [facility], they did not receive cardiac clearance, OV notes or EKG. I have sent again to [facility] at number provided, [phone]

## 2016-10-20 ENCOUNTER — Ambulatory Visit
Admission: RE | Admit: 2016-10-20 | Discharge: 2016-10-20 | Disposition: A | Discharge status: Home / Self Care | Payer: Medicare Other | Source: Ambulatory Visit | Attending: Specialist | Admitting: Specialist

## 2016-10-20 HISTORY — DX: Sleep apnea, unspecified: G47.30

## 2016-10-20 HISTORY — DX: Unspecified osteoarthritis, unspecified site: M19.90

## 2016-10-20 HISTORY — DX: Gastro-esophageal reflux disease without esophagitis: K21.9

## 2016-10-20 NOTE — OR Nursing (Signed)
Clearance notes in chart from Dr. Lavera Guise, Dr. Raul Del and Dr. Fletcher Anon.  Results from echo and stress test also in chart. Recent blood work, EKG found in results review and care everywhere.

## 2016-10-20 NOTE — Patient Instructions (Addendum)
Your procedure is scheduled on: November 01, 2016  Report to [place]   To find out your arrival time please call [phone] between 1PM - 3PM on Tuesday, October 31, 2016   Remember: Instructions that are not followed completely may result in serious medical risk, up to and including death, or upon the discretion of your surgeon and anesthesiologist your surgery may need to be rescheduled.     _X__ 1. Do not eat food after midnight the night before your procedure.                 No gum chewing or hard candies. You may drink clear liquids up to 2 hours                 before you are scheduled to arrive for your surgery- DO not drink clear                 liquids within 2 hours of the start of your surgery.                 Clear Liquids include:  water, apple juice without pulp, clear carbohydrate                 drink such as Clearfast of Gartorade, Black Coffee or Tea (Do not add                 anything to coffee or tea).     _X__ 2.  No Alcohol for 24 hours before or after surgery.   _X__ 3.  Do Not Smoke or use e-cigarettes For 24 Hours Prior to Your Surgery.                 Do not use any chewable tobacco products for at least 6 hours prior to                 surgery.  ____  4.  Bring all medications with you on the day of surgery if instructed.   __x__  5.  Notify your doctor if there is any change in your medical condition      (cold, fever, infections).     Do not wear jewelry, make-up, hairpins, clips or nail polish. Do not wear lotions, powders, or perfumes. You may wear deodorant. Do not shave 48 hours prior to surgery. Men may shave face and neck. Do not bring valuables to the hospital.    [facility] is not responsible for any belongings or valuables.  Contacts, dentures or bridgework may not be worn into surgery. Leave your suitcase in the car. After surgery it may be brought to your room. For patients admitted to the  hospital, discharge time is determined by your treatment team.   Patients discharged the day of surgery will not be allowed to drive home.   Please read over the following fact sheets that you were given:   PREPARING FOR SURGERY/CHG [place]   _x___ Take these medicines the morning of surgery with A SIP OF WATER:    1.LEVOTHYROXINE  2. OMEPRAZOLE  3. PROZAC  4. XANAX (if needed)  5.  6.  ____ Fleet Enema (as directed)   _X___ Use CHG Soap as directed  ____ Use inhalers on the day of surgery  ____ Stop metformin 2 days prior to surgery    ____ Take  1/2 of usual insulin dose the night before surgery. No insulin the morning          of surgery.   __X_ Stop Coumadin/Plavix/ ASPIRIN ON October 19TH .Marland Kitchen.. 5 DAYS PRIOR TO SURGERY  __x__ Stop Anti-inflammatories ONE WEEK PRIOR TO SURGERY ON THE 17th.  THIS INCLUDES IBUPROFEN/ MOTRIN/ ALEVE/ ADVIL/ GOODIES POWDER   __x__ Stop supplements until after surgery.  THIS INCLUDES VITAMIN D AND FISH OIL  __x__ Bring C-Pap to the hospital.   If you complete your Advance Directives document, please bring with you so that we can make a copy for your chart.  Wear something that is easy to get in and out of for surgery.   As instructed by Dr. Fletcher Anon, have your medical doctor follow up for blood work concerning your thyroid.  You may continue to take Neurontin, but do not take day of surgery as it will be given to you at preop.

## 2016-10-31 MED ORDER — CLINDAMYCIN PHOSPHATE 600 MG/50ML IV SOLN
600.0000 mg | Freq: Once | INTRAVENOUS | Status: AC
  Administered 2016-11-01: 600 mg via INTRAVENOUS

## 2016-10-31 MED ORDER — CEFAZOLIN SODIUM-DEXTROSE 2-4 GM/100ML-% IV SOLN
2.0000 g | INTRAVENOUS | Status: AC
  Administered 2016-11-01: 2 g via INTRAVENOUS

## 2016-11-01 ENCOUNTER — Ambulatory Visit: Payer: Medicare Other | Admitting: Certified Registered Nurse Anesthetist

## 2016-11-01 ENCOUNTER — Encounter
Admission: RE | Disposition: A | Discharge status: Home / Self Care | Payer: Self-pay | Source: Ambulatory Visit | Attending: Specialist

## 2016-11-01 ENCOUNTER — Encounter: Payer: Self-pay | Admitting: *Deleted

## 2016-11-01 ENCOUNTER — Ambulatory Visit
Admission: RE | Admit: 2016-11-01 | Discharge: 2016-11-01 | Disposition: A | Discharge status: Home / Self Care | Payer: Medicare Other | Source: Ambulatory Visit | Attending: Specialist | Admitting: Specialist

## 2016-11-01 DIAGNOSIS — G4730 Sleep apnea, unspecified: Secondary | ICD-10-CM | POA: Diagnosis not present

## 2016-11-01 DIAGNOSIS — G5602 Carpal tunnel syndrome, left upper limb: Secondary | ICD-10-CM | POA: Insufficient documentation

## 2016-11-01 DIAGNOSIS — K219 Gastro-esophageal reflux disease without esophagitis: Secondary | ICD-10-CM | POA: Insufficient documentation

## 2016-11-01 DIAGNOSIS — Z7982 Long term (current) use of aspirin: Secondary | ICD-10-CM | POA: Diagnosis not present

## 2016-11-01 DIAGNOSIS — I10 Essential (primary) hypertension: Secondary | ICD-10-CM | POA: Insufficient documentation

## 2016-11-01 DIAGNOSIS — Z79899 Other long term (current) drug therapy: Secondary | ICD-10-CM | POA: Insufficient documentation

## 2016-11-01 HISTORY — PX: CARPAL TUNNEL RELEASE: SHX101

## 2016-11-01 SURGERY — CARPAL TUNNEL RELEASE
Anesthesia: General | Site: Hand | Laterality: Left | Wound class: Clean

## 2016-11-01 MED ORDER — HYDROCODONE-ACETAMINOPHEN 5-325 MG PO TABS: 1.0000 | ORAL_TABLET | Freq: Four times a day (QID) | ORAL | 0 refills | Status: DC | PRN

## 2016-11-01 MED ORDER — GLYCOPYRROLATE 0.2 MG/ML IJ SOLN
INTRAMUSCULAR | Status: DC | PRN
  Administered 2016-11-01: 0.2 mg via INTRAVENOUS

## 2016-11-01 MED ORDER — MELOXICAM 7.5 MG PO TABS
ORAL_TABLET | ORAL | Status: AC
  Filled 2016-11-01: qty 1

## 2016-11-01 MED ORDER — MELOXICAM 7.5 MG PO TABS
15.0000 mg | ORAL_TABLET | Freq: Once | ORAL | Status: AC
  Administered 2016-11-01: 15 mg via ORAL

## 2016-11-01 MED ORDER — MIDAZOLAM HCL 2 MG/2ML IJ SOLN
INTRAMUSCULAR | Status: AC
  Filled 2016-11-01: qty 2

## 2016-11-01 MED ORDER — GABAPENTIN 400 MG PO CAPS
ORAL_CAPSULE | ORAL | Status: AC
  Administered 2016-11-01: 400 mg via ORAL
  Filled 2016-11-01: qty 1

## 2016-11-01 MED ORDER — DEXAMETHASONE SODIUM PHOSPHATE 10 MG/ML IJ SOLN
INTRAMUSCULAR | Status: AC
  Filled 2016-11-01: qty 1

## 2016-11-01 MED ORDER — LIDOCAINE HCL (PF) 2 % IJ SOLN
INTRAMUSCULAR | Status: AC
  Filled 2016-11-01: qty 10

## 2016-11-01 MED ORDER — SEVOFLURANE IN SOLN
RESPIRATORY_TRACT | Status: AC
  Filled 2016-11-01: qty 250

## 2016-11-01 MED ORDER — CEFAZOLIN SODIUM-DEXTROSE 2-4 GM/100ML-% IV SOLN
INTRAVENOUS | Status: AC
  Filled 2016-11-01: qty 100

## 2016-11-01 MED ORDER — PROPOFOL 10 MG/ML IV BOLUS
INTRAVENOUS | Status: AC
  Filled 2016-11-01: qty 20

## 2016-11-01 MED ORDER — MELOXICAM 7.5 MG PO TABS
ORAL_TABLET | ORAL | Status: AC
  Administered 2016-11-01: 15 mg via ORAL
  Filled 2016-11-01: qty 1

## 2016-11-01 MED ORDER — MELOXICAM 15 MG PO TABS: 15.0000 mg | ORAL_TABLET | Freq: Every day | ORAL | 3 refills | Status: DC

## 2016-11-01 MED ORDER — ONDANSETRON HCL 4 MG/2ML IJ SOLN: 4.0000 mg | Freq: Once | INTRAMUSCULAR | Status: DC | PRN

## 2016-11-01 MED ORDER — LACTATED RINGERS IV SOLN
INTRAVENOUS | Status: DC
  Administered 2016-11-01: 07:00:00 via INTRAVENOUS

## 2016-11-01 MED ORDER — CLINDAMYCIN PHOSPHATE 600 MG/50ML IV SOLN
INTRAVENOUS | Status: AC
  Filled 2016-11-01: qty 50

## 2016-11-01 MED ORDER — BUPIVACAINE HCL (PF) 0.5 % IJ SOLN
INTRAMUSCULAR | Status: AC
Start: 2016-11-01 — End: 2016-11-01
  Filled 2016-11-01: qty 30

## 2016-11-01 MED ORDER — BUPIVACAINE HCL 0.5 % IJ SOLN
INTRAMUSCULAR | Status: DC | PRN
  Administered 2016-11-01: 16 mL

## 2016-11-01 MED ORDER — ONDANSETRON HCL 4 MG/2ML IJ SOLN
INTRAMUSCULAR | Status: DC | PRN
  Administered 2016-11-01: 4 mg via INTRAVENOUS

## 2016-11-01 MED ORDER — CHLORHEXIDINE GLUCONATE CLOTH 2 % EX PADS: 6.0000 | MEDICATED_PAD | Freq: Once | CUTANEOUS | Status: DC

## 2016-11-01 MED ORDER — DEXAMETHASONE SODIUM PHOSPHATE 10 MG/ML IJ SOLN
INTRAMUSCULAR | Status: DC | PRN
  Administered 2016-11-01: 10 mg via INTRAVENOUS

## 2016-11-01 MED ORDER — FENTANYL CITRATE (PF) 100 MCG/2ML IJ SOLN
INTRAMUSCULAR | Status: DC | PRN
  Administered 2016-11-01 (×4): 25 ug via INTRAVENOUS

## 2016-11-01 MED ORDER — ONDANSETRON HCL 4 MG/2ML IJ SOLN
INTRAMUSCULAR | Status: AC
  Filled 2016-11-01: qty 2

## 2016-11-01 MED ORDER — MIDAZOLAM HCL 2 MG/2ML IJ SOLN
INTRAMUSCULAR | Status: DC | PRN
  Administered 2016-11-01: 2 mg via INTRAVENOUS

## 2016-11-01 MED ORDER — GABAPENTIN 400 MG PO CAPS
400.0000 mg | ORAL_CAPSULE | Freq: Once | ORAL | Status: AC
  Administered 2016-11-01: 400 mg via ORAL

## 2016-11-01 MED ORDER — PHENYLEPHRINE HCL 10 MG/ML IJ SOLN
INTRAMUSCULAR | Status: DC | PRN
  Administered 2016-11-01: 100 ug via INTRAVENOUS
  Administered 2016-11-01: 150 ug via INTRAVENOUS
  Administered 2016-11-01: 100 ug via INTRAVENOUS
  Administered 2016-11-01: 150 ug via INTRAVENOUS

## 2016-11-01 MED ORDER — FENTANYL CITRATE (PF) 100 MCG/2ML IJ SOLN: 25.0000 ug | INTRAMUSCULAR | Status: DC | PRN

## 2016-11-01 MED ORDER — FENTANYL CITRATE (PF) 100 MCG/2ML IJ SOLN
INTRAMUSCULAR | Status: AC
  Filled 2016-11-01: qty 2

## 2016-11-01 SURGICAL SUPPLY — 26 items
BLADE SURG MINI STRL (BLADE) ×3 IMPLANT
BNDG ESMARK 4X12 TAN STRL LF (GAUZE/BANDAGES/DRESSINGS) ×3 IMPLANT
CANISTER SUCT 1200ML W/VALVE (MISCELLANEOUS) ×3 IMPLANT
CHLORAPREP W/TINT 26ML (MISCELLANEOUS) ×3 IMPLANT
CUFF TOURN 18 STER (MISCELLANEOUS) IMPLANT
ELECT REM PT RETURN 9FT ADLT (ELECTROSURGICAL) ×3
ELECTRODE REM PT RTRN 9FT ADLT (ELECTROSURGICAL) ×1 IMPLANT
GAUZE FLUFF 18X24 1PLY STRL (GAUZE/BANDAGES/DRESSINGS) ×3 IMPLANT
GAUZE PETRO XEROFOAM 1X8 (MISCELLANEOUS) ×3 IMPLANT
GLOVE BIO SURGEON STRL SZ8 (GLOVE) ×3 IMPLANT
GOWN STRL REUS W/ TWL LRG LVL3 (GOWN DISPOSABLE) ×1 IMPLANT
GOWN STRL REUS W/TWL LRG LVL3 (GOWN DISPOSABLE) ×2
GOWN STRL REUS W/TWL LRG LVL4 (GOWN DISPOSABLE) ×3 IMPLANT
KIT RM TURNOVER STRD PROC AR (KITS) ×3 IMPLANT
NS IRRIG 500ML POUR BTL (IV SOLUTION) ×3 IMPLANT
PACK EXTREMITY ARMC (MISCELLANEOUS) ×3 IMPLANT
PAD PREP 24X41 OB/GYN DISP (PERSONAL CARE ITEMS) ×3 IMPLANT
PADDING CAST 4IN STRL (MISCELLANEOUS) ×2
PADDING CAST BLEND 4X4 STRL (MISCELLANEOUS) ×1 IMPLANT
SPLINT CAST 1 STEP 3X12 (MISCELLANEOUS) ×3 IMPLANT
STOCKINETTE BIAS CUT 4 980044 (GAUZE/BANDAGES/DRESSINGS) ×3 IMPLANT
STOCKINETTE STRL 4IN 9604848 (GAUZE/BANDAGES/DRESSINGS) ×3 IMPLANT
SUT ETHILON 4-0 (SUTURE) ×2
SUT ETHILON 4-0 FS2 18XMFL BLK (SUTURE) ×1
SUT ETHILON 5-0 FS-2 18 BLK (SUTURE) ×3 IMPLANT
SUTURE ETHLN 4-0 FS2 18XMF BLK (SUTURE) ×1 IMPLANT

## 2016-11-01 NOTE — Anesthesia Post-op Follow-up Note (Signed)
Anesthesia QCDR form completed.        

## 2016-11-01 NOTE — Anesthesia Postprocedure Evaluation (Signed)
Anesthesia Post Note  Patient: Melissa English  Procedure(s) Performed: CARPAL TUNNEL RELEASE (Left Hand)  Patient location during evaluation: PACU Anesthesia Type: General Level of consciousness: awake and alert Pain management: pain level controlled Vital Signs Assessment: post-procedure vital signs reviewed and stable Respiratory status: spontaneous breathing, nonlabored ventilation, respiratory function stable and patient connected to nasal cannula oxygen Cardiovascular status: blood pressure returned to baseline and stable Postop Assessment: no apparent nausea or vomiting Anesthetic complications: no     Last Vitals:  Vitals:   11/01/16 0908 11/01/16 0937  BP: (!) 146/89 (!) 141/82  Pulse: 70 60  Resp: 16   Temp: (!) 36 C   SpO2: 94% 98%    Last Pain:  Vitals:   11/01/16 0908  TempSrc: Temporal  PainSc:                  Martha Clan

## 2016-11-01 NOTE — Anesthesia Procedure Notes (Signed)
Procedure Name: LMA Insertion Performed by: Demetrius Charity Pre-anesthesia Checklist: Patient identified, Patient being monitored, Timeout performed, Emergency Drugs available and Suction available Patient Re-evaluated:Patient Re-evaluated prior to induction Oxygen Delivery Method: Circle system utilized Preoxygenation: Pre-oxygenation with 100% oxygen Induction Type: IV induction Ventilation: Mask ventilation without difficulty LMA: LMA inserted LMA Size: 3.0 Tube type: Oral Number of attempts: 1 Placement Confirmation: positive ETCO2 and breath sounds checked- equal and bilateral Tube secured with: Tape Dental Injury: Teeth and Oropharynx as per pre-operative assessment

## 2016-11-01 NOTE — Op Note (Signed)
11/01/2016  8:22 AM  PATIENT:  Melissa English    PRE-OPERATIVE DIAGNOSIS: LEFT CARPAL TUNNEL SYNDROME  POST-OPERATIVE DIAGNOSIS: LEFT CARPAL TUNNEL SYNDROME  PROCEDURE:  LEFT CARPAL TUNNEL RELEASE  SURGEON: Park Breed, MD  TOURNIQUET TIME: 18  MIN   ANESTHESIA:   General  PREOPERATIVE INDICATIONS:  [name] is a  66 y.o. female with a diagnosis of left carpal tunnel syndrome who failed conservative measures and elected for surgical management.    The risks benefits and alternatives were discussed with the patient preoperatively including but not limited to the risks of infection, bleeding, nerve injury, incomplete relief of symptoms, pillar pain, cardiopulmonary complications, the need for revision surgery, among others, and the patient was willing to proceed.  OPERATIVE FINDINGS: Thickened volar ligament and nerve compression.  OPERATIVE PROCEDURE: The patient is brought to the operating room placed in the supine position. General anesthesia was administered. The left upper extremity was prepped and draped in usual sterile fashion. Time out was performed. The arm was elevated and exsanguinated and the tourniquet was inflated. Incision was made in line with the radial border of the ring finger. The carpal tunnel transverse fascia was identified, cleaned, and incised sharply. The common sensory branches were visualized along with the superficial palmar arch and protected.  The median nerve was protected below. A Kelly clamp was  placed underneath the transverse carpal ligament, protecting the nerve. I released the ligament completely, and then released the proximal distal volar forearm fascia. The nerve was identified, and visualized, and protected throughout the case. The motor branch was intact upon inspection. No masses or abnormalities were identified in the ulnar bursa.  The wounds were irrigated copiously, and the wounds injected with 1/2 % marcaqine, and the skin closed with  nylon followed by a volar splint and sterile gauze. Tourniquet was deflated with good return of blood flow to all fingers. Sponge and needle counts were correct.  The patient tolerated this well, with no complications. The patient was awakened and taken to recovery in good condition.

## 2016-11-01 NOTE — Transfer of Care (Signed)
Immediate Anesthesia Transfer of Care Note  Patient: Melissa English  Procedure(s) Performed: CARPAL TUNNEL RELEASE (Left Hand)  Patient Location: PACU  Anesthesia Type:General  Level of Consciousness: sedated  Airway & Oxygen Therapy: Patient Spontanous Breathing and Patient connected to face mask oxygen  Post-op Assessment: Report given to RN and Post -op Vital signs reviewed and stable  Post vital signs: Reviewed and stable  Last Vitals:  Vitals:   11/01/16 0616 11/01/16 0815  BP: (!) 156/85 104/69  Pulse: 61 (!) 59  Resp: 16 18  Temp: 36.8 C (!) 36.2 C  SpO2: 98% (P) 97%    Last Pain: There were no vitals filed for this visit.       Complications: No apparent anesthesia complications

## 2016-11-01 NOTE — H&P (Signed)
THE PATIENT WAS SEEN PRIOR TO SURGERY TODAY.  HISTORY, ALLERGIES, HOME MEDICATIONS AND OPERATIVE PROCEDURE WERE REVIEWED. RISKS AND BENEFITS OF SURGERY DISCUSSED WITH PATIENT AGAIN.  NO CHANGES FROM INITIAL HISTORY AND PHYSICAL NOTED.    

## 2016-11-01 NOTE — Anesthesia Preprocedure Evaluation (Signed)
Anesthesia Evaluation  Patient identified by MRN, date of birth, ID band Patient awake    Reviewed: Allergy & Precautions, H&P , NPO status , Patient's Chart, lab work & pertinent test results, reviewed documented beta blocker date and time   History of Anesthesia Complications Negative for: history of anesthetic complications  Airway Mallampati: III  TM Distance: >3 FB Neck ROM: full    Dental  (+) Dental Advidsory Given, Teeth Intact   Pulmonary neg shortness of breath, sleep apnea and Continuous Positive Airway Pressure Ventilation , neg COPD, neg recent URI,           Cardiovascular Exercise Tolerance: Good hypertension, (-) angina(-) CAD, (-) Past MI, (-) Cardiac Stents and (-) CABG (-) dysrhythmias (-) Valvular Problems/Murmurs     Neuro/Psych PSYCHIATRIC DISORDERS negative neurological ROS     GI/Hepatic Neg liver ROS, GERD  ,  Endo/Other  neg diabetesHypothyroidism   Renal/GU negative Renal ROS  negative genitourinary   Musculoskeletal   Abdominal   Peds  Hematology negative hematology ROS (+)   Anesthesia Other Findings Past Medical History: No date: Anxiety 2018: Arthritis     Comment:  hands, back...getting injections for back No date: GERD (gastroesophageal reflux disease) No date: HLD (hyperlipidemia) No date: HTN (hypertension) No date: Hypothyroidism No date: Sleep apnea     Comment:  uses cpap   Reproductive/Obstetrics negative OB ROS                             Anesthesia Physical Anesthesia Plan  ASA: II  Anesthesia Plan: General   Post-op Pain Management:    Induction: Intravenous  PONV Risk Score and Plan: 3 and Ondansetron, Dexamethasone and Midazolam  Airway Management Planned: LMA  Additional Equipment:   Intra-op Plan:   Post-operative Plan: Extubation in OR  Informed Consent: I have reviewed the patients History and Physical, chart, labs and  discussed the procedure including the risks, benefits and alternatives for the proposed anesthesia with the patient or authorized representative who has indicated his/her understanding and acceptance.   Dental Advisory Given  Plan Discussed with: Anesthesiologist, CRNA and Surgeon  Anesthesia Plan Comments:         Anesthesia Quick Evaluation

## 2016-11-01 NOTE — Progress Notes (Signed)
Capillary refill positive to left hand   Can wiggle fingers   Fingers warm and pink on left upper extremity

## 2016-11-03 DIAGNOSIS — G5602 Carpal tunnel syndrome, left upper limb: Secondary | ICD-10-CM | POA: Diagnosis not present

## 2016-11-17 DIAGNOSIS — Z1231 Encounter for screening mammogram for malignant neoplasm of breast: Secondary | ICD-10-CM | POA: Diagnosis not present

## 2016-12-07 DIAGNOSIS — G4733 Obstructive sleep apnea (adult) (pediatric): Secondary | ICD-10-CM | POA: Diagnosis not present

## 2016-12-07 DIAGNOSIS — Z942 Lung transplant status: Secondary | ICD-10-CM | POA: Diagnosis not present

## 2016-12-07 DIAGNOSIS — J45990 Exercise induced bronchospasm: Secondary | ICD-10-CM | POA: Diagnosis not present

## 2016-12-07 DIAGNOSIS — R0609 Other forms of dyspnea: Secondary | ICD-10-CM | POA: Diagnosis not present

## 2016-12-29 ENCOUNTER — Ambulatory Visit: Payer: Medicare Other | Admitting: Cardiovascular Disease

## 2016-12-29 VITALS — BP 110/68 | HR 58 | Ht 66.0 in | Wt 208.5 lb

## 2016-12-29 DIAGNOSIS — I10 Essential (primary) hypertension: Secondary | ICD-10-CM

## 2016-12-29 DIAGNOSIS — E785 Hyperlipidemia, unspecified: Secondary | ICD-10-CM | POA: Diagnosis not present

## 2016-12-29 DIAGNOSIS — R0602 Shortness of breath: Secondary | ICD-10-CM | POA: Diagnosis not present

## 2016-12-29 NOTE — Progress Notes (Signed)
Cardiology Office Note   Date:  12/29/2016   ID:  Melissa English, DOB 1950/10/11, MRN [id number]  PCP:  Cletis Athens, MD  Cardiologist:   Kathlyn Sacramento, MD   Chief Complaint  Patient presents with  . Other    3 month follow up. Patient c/o SOB every once in a while. Meds reviewed verbally with patient.       History of Present Illness: Melissa English is a 66 y.o. female who presents for A follow-up visit regarding chest pain shortness of breath. She has known history of hypertension, hyperlipidemia, sleep apnea on CPAP and hypothyroidism. She has been evaluated for atypical chest pain and exertional dyspnea.  Troponin was negative. D-dimer was mildly elevated but  CTA was negative for pulmonary embolism.  Nuclear stress test was normal.  Echocardiogram showed normal LV systolic function and no significant abnormalities. She was evaluated by Dr. Raul Del and is suspected of having exercise-induced asthma.  She was placed on an inhaler with an improvement in her symptoms.  She denies any recent chest pain.  She reports stable exertional dyspnea.  Past Medical History:  Diagnosis Date  . Anxiety   . Arthritis 2018   hands, back...getting injections for back  . GERD (gastroesophageal reflux disease)   . HLD (hyperlipidemia)   . HTN (hypertension)   . Hypothyroidism   . Sleep apnea    uses cpap    Past Surgical History:  Procedure Laterality Date  . CARPAL TUNNEL RELEASE Right 2008   Dr. Derrel Nip  . CARPAL TUNNEL RELEASE Left 11/01/2016   Procedure: CARPAL TUNNEL RELEASE;  Surgeon: Earnestine Leys, MD;  Location: [facility] ORS;  Service: Orthopedics;  Laterality: Left;  . CHOLECYSTECTOMY    . LUMBAR DISC SURGERY  1998   L4 - L5.  no metal in back  . TOTAL VAGINAL HYSTERECTOMY  1998     Current Outpatient Medications  Medication Sig Dispense Refill  . ALPRAZolam (XANAX) 0.5 MG tablet Take 0.5 mg by mouth 2 (two) times daily.     Marland Kitchen aspirin 81 MG tablet Take 81 mg by mouth  daily.      Marland Kitchen atorvastatin (LIPITOR) 20 MG tablet Take 20 mg by mouth daily.    . cholecalciferol (VITAMIN D) 1000 units tablet Take 1,000 Units by mouth daily.    Marland Kitchen FLUoxetine (PROZAC) 20 MG capsule Take 20 mg by mouth daily.     Marland Kitchen gabapentin (NEURONTIN) 100 MG capsule Take 200 mg by mouth 3 (three) times daily.     Marland Kitchen HYDROcodone-acetaminophen (NORCO) 5-325 MG tablet Take 1 tablet by mouth every 6 (six) hours as needed. 40 tablet 0  . levothyroxine (SYNTHROID, LEVOTHROID) 88 MCG tablet Take 88 mcg by mouth daily before breakfast.    . lisinopril (PRINIVIL,ZESTRIL) 10 MG tablet Take 10 mg by mouth daily.     . meloxicam (MOBIC) 15 MG tablet Take 1 tablet (15 mg total) by mouth daily. 30 tablet 3  . Omega-3 Fatty Acids (FISH OIL) 1000 MG CAPS Take 1 capsule by mouth.    Marland Kitchen omeprazole (PRILOSEC) 20 MG capsule Take 20 mg by mouth daily.     No current facility-administered medications for this visit.     Allergies:   Codeine and Sulfa antibiotics    Social History:  The patient  reports that  has never smoked. she has never used smokeless tobacco. She reports that she drinks alcohol. She reports that she does not use drugs.   Family History:  The patient's family history is negative for coronary artery disease or sudden death.   ROS:  Please see the history of present illness.   Otherwise, review of systems are positive for none.   All other systems are reviewed and negative.    PHYSICAL EXAM: VS:  BP 110/68 (BP Location: Left Arm, Patient Position: Sitting, Cuff Size: Normal)   Pulse (!) 58   Ht 5\' 6"  (1.676 m)   Wt 208 lb 8 oz (94.6 kg)   BMI 33.65 kg/m  , BMI Body mass index is 33.65 kg/m. GEN: Well nourished, well developed, in no acute distress  HEENT: normal  Neck: no JVD, carotid bruits, or masses Cardiac: RRR; no murmurs, rubs, or gallops,no edema  Respiratory:  clear to auscultation bilaterally, normal work of breathing GI: soft, nontender, nondistended, + BS MS: no  deformity or atrophy  Skin: warm and dry, no rash Neuro:  Strength and sensation are intact Psych: euthymic mood, full affect   EKG:  EKG is ordered today. The ekg ordered today demonstrates sinus bradycardia with first-degree AV block.    Recent Labs: No results found for requested labs within last 8760 hours.    Lipid Panel    Component Value Date/Time   CHOL 193 08/23/2011 0743   TRIG 121 08/23/2011 0743   HDL 55 08/23/2011 0743   VLDL 24 08/23/2011 0743   LDLCALC 114 (H) 08/23/2011 0743      Wt Readings from Last 3 Encounters:  12/29/16 208 lb 8 oz (94.6 kg)  10/03/16 208 lb 12 oz (94.7 kg)  08/31/16 204 lb (92.5 kg)        PAD Screen 08/31/2016  Previous PAD dx? No  Previous surgical procedure? No  Pain with walking? Yes  Subsides with rest? No  Feet/toe relief with dangling? No  Painful, non-healing ulcers? No  Extremities discolored? No      ASSESSMENT AND PLAN:  1.  Exertional dyspnea: Likely due to lung disease and physical deconditioning.  Cardiac workup was negative including stress test and echocardiogram. I advised her to start a walking program but she is not able to do that until the weather is improved. We have discussed again that cardiac catheterization should be left as a last resort for continued symptoms.  No cardiac testing so far has been reassuring.  2.  Essential hypertension: Blood pressure is controlled on lisinopril.  3 . Hyperlipidemia: Currently on rosuvastatin 10 mg daily.    Disposition:   FU with me in 12 months  Signed,  Kathlyn Sacramento, MD  12/29/2016 2:09 PM    [place]

## 2016-12-29 NOTE — Patient Instructions (Signed)
Medication Instructions: Continue same medications.   Labwork: None.   Procedures/Testing: None.   Follow-Up: 1 year with Dr. Sherrelle Prochazka.   Any Additional Special Instructions Will Be Listed Below (If Applicable).     If you need a refill on your cardiac medications before your next appointment, please call your pharmacy.   

## 2017-02-12 DIAGNOSIS — H6693 Otitis media, unspecified, bilateral: Secondary | ICD-10-CM | POA: Diagnosis not present

## 2017-02-12 DIAGNOSIS — E038 Other specified hypothyroidism: Secondary | ICD-10-CM | POA: Diagnosis not present

## 2017-02-12 DIAGNOSIS — K219 Gastro-esophageal reflux disease without esophagitis: Secondary | ICD-10-CM | POA: Diagnosis not present

## 2017-03-12 DIAGNOSIS — H6693 Otitis media, unspecified, bilateral: Secondary | ICD-10-CM | POA: Diagnosis not present

## 2017-03-12 DIAGNOSIS — R5381 Other malaise: Secondary | ICD-10-CM | POA: Diagnosis not present

## 2017-03-12 DIAGNOSIS — Z885 Allergy status to narcotic agent status: Secondary | ICD-10-CM | POA: Diagnosis not present

## 2017-03-12 DIAGNOSIS — I10 Essential (primary) hypertension: Secondary | ICD-10-CM | POA: Diagnosis not present

## 2017-03-12 DIAGNOSIS — E7849 Other hyperlipidemia: Secondary | ICD-10-CM | POA: Diagnosis not present

## 2017-03-12 DIAGNOSIS — K219 Gastro-esophageal reflux disease without esophagitis: Secondary | ICD-10-CM | POA: Diagnosis not present

## 2017-03-19 ENCOUNTER — Other Ambulatory Visit: Payer: Self-pay

## 2017-03-19 NOTE — Patient Outreach (Signed)
[facility]) Care Management  03/19/2017  Melissa English Aug 19, 1950 [id number]   Medication Adherence call to [name] patient is showing past due under [facility].on Atorvastatin 20 mg spoke with patient she said she will be receiving a 90 days supply through mail order she said she still has some medication until the mail order arrived.   [facility] Direct Dial [phone]  Fax [phone] [name]

## 2017-03-22 DIAGNOSIS — M5416 Radiculopathy, lumbar region: Secondary | ICD-10-CM | POA: Diagnosis not present

## 2017-03-22 DIAGNOSIS — M5126 Other intervertebral disc displacement, lumbar region: Secondary | ICD-10-CM | POA: Diagnosis not present

## 2017-04-04 DIAGNOSIS — L814 Other melanin hyperpigmentation: Secondary | ICD-10-CM | POA: Diagnosis not present

## 2017-04-04 DIAGNOSIS — D229 Melanocytic nevi, unspecified: Secondary | ICD-10-CM | POA: Diagnosis not present

## 2017-04-04 DIAGNOSIS — L920 Granuloma annulare: Secondary | ICD-10-CM | POA: Diagnosis not present

## 2017-04-04 DIAGNOSIS — G4733 Obstructive sleep apnea (adult) (pediatric): Secondary | ICD-10-CM | POA: Diagnosis not present

## 2017-04-13 DIAGNOSIS — Z885 Allergy status to narcotic agent status: Secondary | ICD-10-CM | POA: Diagnosis not present

## 2017-04-13 DIAGNOSIS — K219 Gastro-esophageal reflux disease without esophagitis: Secondary | ICD-10-CM | POA: Diagnosis not present

## 2017-04-13 DIAGNOSIS — M1990 Unspecified osteoarthritis, unspecified site: Secondary | ICD-10-CM | POA: Diagnosis not present

## 2017-05-07 DIAGNOSIS — M19041 Primary osteoarthritis, right hand: Secondary | ICD-10-CM | POA: Insufficient documentation

## 2017-05-07 DIAGNOSIS — J301 Allergic rhinitis due to pollen: Secondary | ICD-10-CM | POA: Diagnosis not present

## 2017-05-07 DIAGNOSIS — M2550 Pain in unspecified joint: Secondary | ICD-10-CM | POA: Diagnosis not present

## 2017-05-07 DIAGNOSIS — H9319 Tinnitus, unspecified ear: Secondary | ICD-10-CM | POA: Diagnosis not present

## 2017-05-07 DIAGNOSIS — M19042 Primary osteoarthritis, left hand: Secondary | ICD-10-CM | POA: Diagnosis not present

## 2017-07-02 DIAGNOSIS — G4733 Obstructive sleep apnea (adult) (pediatric): Secondary | ICD-10-CM | POA: Diagnosis not present

## 2017-07-02 DIAGNOSIS — R05 Cough: Secondary | ICD-10-CM | POA: Diagnosis not present

## 2017-07-02 DIAGNOSIS — J45990 Exercise induced bronchospasm: Secondary | ICD-10-CM | POA: Diagnosis not present

## 2017-07-05 DIAGNOSIS — M5126 Other intervertebral disc displacement, lumbar region: Secondary | ICD-10-CM | POA: Diagnosis not present

## 2017-07-05 DIAGNOSIS — M5416 Radiculopathy, lumbar region: Secondary | ICD-10-CM | POA: Diagnosis not present

## 2017-07-20 ENCOUNTER — Other Ambulatory Visit: Payer: Self-pay | Admitting: Physical Medicine and Rehabilitation

## 2017-07-20 DIAGNOSIS — M5442 Lumbago with sciatica, left side: Secondary | ICD-10-CM

## 2017-07-25 ENCOUNTER — Ambulatory Visit
Admission: RE | Admit: 2017-07-25 | Discharge: 2017-07-25 | Disposition: A | Discharge status: Home / Self Care | Payer: Medicare Other | Source: Ambulatory Visit | Attending: Physical Medicine and Rehabilitation | Admitting: Physical Medicine and Rehabilitation

## 2017-07-25 DIAGNOSIS — M48061 Spinal stenosis, lumbar region without neurogenic claudication: Secondary | ICD-10-CM | POA: Insufficient documentation

## 2017-07-25 DIAGNOSIS — M5442 Lumbago with sciatica, left side: Secondary | ICD-10-CM | POA: Diagnosis not present

## 2017-07-25 DIAGNOSIS — M5126 Other intervertebral disc displacement, lumbar region: Secondary | ICD-10-CM | POA: Diagnosis not present

## 2017-07-25 DIAGNOSIS — M545 Low back pain: Secondary | ICD-10-CM | POA: Diagnosis not present

## 2017-08-06 DIAGNOSIS — M5136 Other intervertebral disc degeneration, lumbar region: Secondary | ICD-10-CM | POA: Diagnosis not present

## 2017-08-06 DIAGNOSIS — M5126 Other intervertebral disc displacement, lumbar region: Secondary | ICD-10-CM | POA: Diagnosis not present

## 2017-08-06 DIAGNOSIS — M5416 Radiculopathy, lumbar region: Secondary | ICD-10-CM | POA: Diagnosis not present

## 2017-08-07 DIAGNOSIS — H9209 Otalgia, unspecified ear: Secondary | ICD-10-CM | POA: Diagnosis not present

## 2017-08-07 DIAGNOSIS — R51 Headache: Secondary | ICD-10-CM | POA: Diagnosis not present

## 2017-08-07 DIAGNOSIS — R42 Dizziness and giddiness: Secondary | ICD-10-CM | POA: Diagnosis not present

## 2017-08-07 DIAGNOSIS — H8111 Benign paroxysmal vertigo, right ear: Secondary | ICD-10-CM | POA: Diagnosis not present

## 2017-08-08 DIAGNOSIS — M19041 Primary osteoarthritis, right hand: Secondary | ICD-10-CM | POA: Diagnosis not present

## 2017-08-08 DIAGNOSIS — M5136 Other intervertebral disc degeneration, lumbar region: Secondary | ICD-10-CM | POA: Diagnosis not present

## 2017-08-08 DIAGNOSIS — M19042 Primary osteoarthritis, left hand: Secondary | ICD-10-CM | POA: Diagnosis not present

## 2017-08-09 DIAGNOSIS — M5416 Radiculopathy, lumbar region: Secondary | ICD-10-CM | POA: Diagnosis not present

## 2017-08-09 DIAGNOSIS — M5126 Other intervertebral disc displacement, lumbar region: Secondary | ICD-10-CM | POA: Diagnosis not present

## 2017-08-20 DIAGNOSIS — H8111 Benign paroxysmal vertigo, right ear: Secondary | ICD-10-CM | POA: Diagnosis not present

## 2017-08-23 ENCOUNTER — Other Ambulatory Visit: Payer: Self-pay | Admitting: Physical Medicine and Rehabilitation

## 2017-08-23 DIAGNOSIS — M5412 Radiculopathy, cervical region: Secondary | ICD-10-CM

## 2017-08-24 DIAGNOSIS — R42 Dizziness and giddiness: Secondary | ICD-10-CM | POA: Diagnosis not present

## 2017-09-06 ENCOUNTER — Ambulatory Visit
Admission: RE | Admit: 2017-09-06 | Discharge: 2017-09-06 | Disposition: A | Discharge status: Home / Self Care | Payer: Medicare Other | Source: Ambulatory Visit | Attending: Physical Medicine and Rehabilitation | Admitting: Physical Medicine and Rehabilitation

## 2017-09-06 DIAGNOSIS — Z981 Arthrodesis status: Secondary | ICD-10-CM | POA: Diagnosis not present

## 2017-09-06 DIAGNOSIS — M5412 Radiculopathy, cervical region: Secondary | ICD-10-CM | POA: Insufficient documentation

## 2017-09-06 DIAGNOSIS — M4802 Spinal stenosis, cervical region: Secondary | ICD-10-CM | POA: Diagnosis not present

## 2017-09-06 DIAGNOSIS — M5031 Other cervical disc degeneration,  high cervical region: Secondary | ICD-10-CM | POA: Insufficient documentation

## 2017-09-06 DIAGNOSIS — M4803 Spinal stenosis, cervicothoracic region: Secondary | ICD-10-CM | POA: Diagnosis not present

## 2017-09-06 DIAGNOSIS — M4602 Spinal enthesopathy, cervical region: Secondary | ICD-10-CM | POA: Diagnosis not present

## 2017-09-17 DIAGNOSIS — M5416 Radiculopathy, lumbar region: Secondary | ICD-10-CM | POA: Diagnosis not present

## 2017-09-17 DIAGNOSIS — M5412 Radiculopathy, cervical region: Secondary | ICD-10-CM | POA: Diagnosis not present

## 2017-09-17 DIAGNOSIS — M5136 Other intervertebral disc degeneration, lumbar region: Secondary | ICD-10-CM | POA: Diagnosis not present

## 2017-09-17 DIAGNOSIS — M5126 Other intervertebral disc displacement, lumbar region: Secondary | ICD-10-CM | POA: Diagnosis not present

## 2017-09-17 DIAGNOSIS — M5030 Other cervical disc degeneration, unspecified cervical region: Secondary | ICD-10-CM | POA: Diagnosis not present

## 2017-10-15 DIAGNOSIS — M1990 Unspecified osteoarthritis, unspecified site: Secondary | ICD-10-CM | POA: Diagnosis not present

## 2017-10-15 DIAGNOSIS — K219 Gastro-esophageal reflux disease without esophagitis: Secondary | ICD-10-CM | POA: Diagnosis not present

## 2017-10-26 DIAGNOSIS — M5126 Other intervertebral disc displacement, lumbar region: Secondary | ICD-10-CM | POA: Diagnosis not present

## 2017-10-26 DIAGNOSIS — G8929 Other chronic pain: Secondary | ICD-10-CM | POA: Diagnosis not present

## 2017-10-26 DIAGNOSIS — M5442 Lumbago with sciatica, left side: Secondary | ICD-10-CM | POA: Diagnosis not present

## 2017-10-26 DIAGNOSIS — M545 Low back pain: Secondary | ICD-10-CM | POA: Diagnosis not present

## 2017-10-26 DIAGNOSIS — M5416 Radiculopathy, lumbar region: Secondary | ICD-10-CM | POA: Diagnosis not present

## 2017-11-05 DIAGNOSIS — M5116 Intervertebral disc disorders with radiculopathy, lumbar region: Secondary | ICD-10-CM | POA: Diagnosis not present

## 2017-11-05 DIAGNOSIS — M5126 Other intervertebral disc displacement, lumbar region: Secondary | ICD-10-CM | POA: Diagnosis not present

## 2017-11-05 HISTORY — PX: BACK SURGERY: SHX140

## 2017-12-17 DIAGNOSIS — G4733 Obstructive sleep apnea (adult) (pediatric): Secondary | ICD-10-CM | POA: Diagnosis not present

## 2017-12-24 DIAGNOSIS — Z0000 Encounter for general adult medical examination without abnormal findings: Secondary | ICD-10-CM | POA: Diagnosis not present

## 2017-12-24 DIAGNOSIS — M1990 Unspecified osteoarthritis, unspecified site: Secondary | ICD-10-CM | POA: Diagnosis not present

## 2017-12-24 DIAGNOSIS — K219 Gastro-esophageal reflux disease without esophagitis: Secondary | ICD-10-CM | POA: Diagnosis not present

## 2017-12-25 DIAGNOSIS — R5381 Other malaise: Secondary | ICD-10-CM | POA: Diagnosis not present

## 2017-12-25 DIAGNOSIS — E7849 Other hyperlipidemia: Secondary | ICD-10-CM | POA: Diagnosis not present

## 2017-12-25 DIAGNOSIS — I10 Essential (primary) hypertension: Secondary | ICD-10-CM | POA: Diagnosis not present

## 2018-06-26 ENCOUNTER — Telehealth: Payer: Self-pay

## 2018-06-26 NOTE — Telephone Encounter (Signed)
Virtual Visit Pre-Appointment Phone Call  "[place], I am calling you today to discuss your upcoming appointment. We are currently trying to limit exposure to the virus that causes COVID-19 by seeing patients at home rather than in the office."  1. "What is the BEST phone number to call the day of the visit?" - include this in appointment notes  2. Do you have or have access to (through a family member/friend) a smartphone with video capability that we can use for your visit?" a. If yes - list this number in appt notes as cell (if different from BEST phone #) and list the appointment type as a VIDEO visit in appointment notes b. If no - list the appointment type as a PHONE visit in appointment notes  3. Confirm consent - "In the setting of the current Covid19 crisis, you are scheduled for a phone visit with your provider on 07/09/2018 at 4:20PM.  Just as we do with many in-office visits, in order for you to participate in this visit, we must obtain consent.  If you'd like, I can send this to your mychart (if signed up) or email for you to review.  Otherwise, I can obtain your verbal consent now.  All virtual visits are billed to your insurance company just like a normal visit would be.  By agreeing to a virtual visit, we'd like you to understand that the technology does not allow for your provider to perform an examination, and thus may limit your provider's ability to fully assess your condition. If your provider identifies any concerns that need to be evaluated in person, we will make arrangements to do so.  Finally, though the technology is pretty good, we cannot assure that it will always work on either your or our end, and in the setting of a video visit, we may have to convert it to a phone-only visit.  In either situation, we cannot ensure that we have a secure connection.  Are you willing to proceed?" STAFF: Did the patient verbally acknowledge consent to telehealth visit? Document YES/NO  here: YES  4. Advise patient to be prepared - "Two hours prior to your appointment, go ahead and check your blood pressure, pulse, oxygen saturation, and your weight (if you have the equipment to check those) and write them all down. When your visit starts, your provider will ask you for this information. If you have an Apple Watch or Kardia device, please plan to have heart rate information ready on the day of your appointment. Please have a pen and paper handy nearby the day of the visit as well."  5. Give patient instructions for MyChart download to smartphone OR [email or web address] as below if video visit (depending on what platform provider is using)  6. Inform patient they will receive a phone call 15 minutes prior to their appointment time (may be from unknown caller ID) so they should be prepared to answer    TELEPHONE CALL NOTE  Melissa English has been deemed a candidate for a follow-up tele-health visit to limit community exposure during the Covid-19 pandemic. I spoke with the patient via phone to ensure availability of phone/video source, confirm preferred email & phone number, and discuss instructions and expectations.  I reminded EVENY ANASTAS to be prepared with any vital sign and/or heart rhythm information that could potentially be obtained via home monitoring, at the time of her visit. I reminded NADALYN DERINGER to expect a phone call prior to her visit.  Rene Paci McClain 06/26/2018 12:25 PM    FULL LENGTH CONSENT FOR TELE-HEALTH VISIT   I hereby voluntarily request, consent and authorize [facility] and its employed or contracted physicians, physician assistants, nurse practitioners or other licensed health care professionals (the Practitioner), to provide me with telemedicine health care services (the Services") as deemed necessary by the treating Practitioner. I acknowledge and consent to receive the Services by the Practitioner via telemedicine. I understand that the  telemedicine visit will involve communicating with the Practitioner through live audiovisual communication technology and the disclosure of certain medical information by electronic transmission. I acknowledge that I have been given the opportunity to request an in-person assessment or other available alternative prior to the telemedicine visit and am voluntarily participating in the telemedicine visit.  I understand that I have the right to withhold or withdraw my consent to the use of telemedicine in the course of my care at any time, without affecting my right to future care or treatment, and that the Practitioner or I may terminate the telemedicine visit at any time. I understand that I have the right to inspect all information obtained and/or recorded in the course of the telemedicine visit and may receive copies of available information for a reasonable fee.  I understand that some of the potential risks of receiving the Services via telemedicine include:   Delay or interruption in medical evaluation due to technological equipment failure or disruption;  Information transmitted may not be sufficient (e.g. poor resolution of images) to allow for appropriate medical decision making by the Practitioner; and/or   In rare instances, security protocols could fail, causing a breach of personal health information.  Furthermore, I acknowledge that it is my responsibility to provide information about my medical history, conditions and care that is complete and accurate to the best of my ability. I acknowledge that Practitioner's advice, recommendations, and/or decision may be based on factors not within their control, such as incomplete or inaccurate data provided by me or distortions of diagnostic images or specimens that may result from electronic transmissions. I understand that the practice of medicine is not an exact science and that Practitioner makes no warranties or guarantees regarding treatment  outcomes. I acknowledge that I will receive a copy of this consent concurrently upon execution via email to the email address I last provided but may also request a printed copy by calling the office of [place].    I understand that my insurance will be billed for this visit.   I have read or had this consent read to me.  I understand the contents of this consent, which adequately explains the benefits and risks of the Services being provided via telemedicine.   I have been provided ample opportunity to ask questions regarding this consent and the Services and have had my questions answered to my satisfaction.  I give my informed consent for the services to be provided through the use of telemedicine in my medical care  By participating in this telemedicine visit I agree to the above.

## 2018-07-09 ENCOUNTER — Telehealth (INDEPENDENT_AMBULATORY_CARE_PROVIDER_SITE_OTHER): Payer: Medicare Other | Admitting: Cardiovascular Disease

## 2018-07-09 ENCOUNTER — Other Ambulatory Visit: Payer: Self-pay

## 2018-07-09 ENCOUNTER — Encounter: Payer: Self-pay | Admitting: Cardiovascular Disease

## 2018-07-09 VITALS — BP 119/83 | HR 62 | Ht 66.0 in | Wt 205.0 lb

## 2018-07-09 DIAGNOSIS — R0602 Shortness of breath: Secondary | ICD-10-CM

## 2018-07-09 DIAGNOSIS — I10 Essential (primary) hypertension: Secondary | ICD-10-CM | POA: Diagnosis not present

## 2018-07-09 NOTE — Progress Notes (Signed)
Virtual Visit via Telephone Note   This visit type was conducted due to national recommendations for restrictions regarding the COVID-19 Pandemic (e.g. social distancing) in an effort to limit this patient's exposure and mitigate transmission in our community.  Due to her co-morbid illnesses, this patient is at least at moderate risk for complications without adequate follow up.  This format is felt to be most appropriate for this patient at this time.  The patient did not have access to video technology/had technical difficulties with video requiring transitioning to audio format only (telephone).  All issues noted in this document were discussed and addressed.  No physical exam could be performed with this format.  Please refer to the patient's chart for her  consent to telehealth for [facility].   Date:  07/09/2018   ID:  Melissa English, DOB 03/24/1950, MRN [id number]  Patient Location: Home Provider Location: Office  PCP:  Cletis Athens, MD  Cardiologist:  No primary care provider on file.  Electrophysiologist:  None   Evaluation Performed:  Follow-Up Visit  Chief Complaint: Doing well overall.  History of Present Illness:    Melissa English is a 68 y.o. female was reached via phone for a follow-up visit regarding atypical chest pain and exertional dyspnea. She has known history of hypertension, hyperlipidemia, sleep apnea on CPAP and hypothyroidism. She was evaluated in 2018 for atypical chest pain and exertional dyspnea with negative cardiac work-up including Lexiscan Myoview and echocardiogram.  She was seen by pulmonary and was suspected of having exercise-induced asthma that subsequently improved. Since most recent evaluation, she underwent back surgery which was successful and her mobility has improved significantly since then.  She reports almost resolution of dyspnea and no further chest pain.  The patient does not have symptoms concerning for COVID-19 infection (fever,  chills, cough, or new shortness of breath).    Past Medical History:  Diagnosis Date  . Anxiety   . Arthritis 2018   hands, back...getting injections for back  . GERD (gastroesophageal reflux disease)   . HLD (hyperlipidemia)   . HTN (hypertension)   . Hypothyroidism   . Sleep apnea    uses cpap   Past Surgical History:  Procedure Laterality Date  . CARPAL TUNNEL RELEASE Right 2008   Dr. Derrel Nip  . CARPAL TUNNEL RELEASE Left 11/01/2016   Procedure: CARPAL TUNNEL RELEASE;  Surgeon: Earnestine Leys, MD;  Location: [facility] ORS;  Service: Orthopedics;  Laterality: Left;  . CHOLECYSTECTOMY    . LUMBAR DISC SURGERY  1998   L4 - L5.  no metal in back  . TOTAL VAGINAL HYSTERECTOMY  1998     Current Meds  Medication Sig  . ALPRAZolam (XANAX) 0.5 MG tablet Take 0.5 mg by mouth 2 (two) times daily.   Marland Kitchen aspirin 81 MG tablet Take 81 mg by mouth daily.    Marland Kitchen atorvastatin (LIPITOR) 20 MG tablet Take 20 mg by mouth daily.  . Calcium-Magnesium-Zinc 333-133-5 MG TABS Take by mouth daily.   Marland Kitchen FLUoxetine (PROZAC) 20 MG capsule Take 20 mg by mouth daily.   Marland Kitchen gabapentin (NEURONTIN) 100 MG capsule Take 200 mg by mouth 3 (three) times daily.   Marland Kitchen levothyroxine (SYNTHROID) 75 MCG tablet Take by mouth.  Marland Kitchen lisinopril (PRINIVIL,ZESTRIL) 10 MG tablet Take 10 mg by mouth daily.   Marland Kitchen omeprazole (PRILOSEC) 20 MG capsule Take 20 mg by mouth daily.     Allergies:   Codeine, Sulfa antibiotics, Other, Sulfasalazine, and Prednisone   Social  History   Tobacco Use  . Smoking status: Never Smoker  . Smokeless tobacco: Never Used  Substance Use Topics  . Alcohol use: Yes    Comment: occasional wine or beer  . Drug use: No     Family Hx: The patient's family history is not on file.  ROS:   Please see the history of present illness.     All other systems reviewed and are negative.   Prior CV studies:   The following studies were reviewed today:    Labs/Other Tests and Data Reviewed:    EKG:  No ECG  reviewed.  Recent Labs: No results found for requested labs within last 8760 hours.   Recent Lipid Panel Lab Results  Component Value Date/Time   CHOL 193 08/23/2011 07:43 AM   TRIG 121 08/23/2011 07:43 AM   HDL 55 08/23/2011 07:43 AM   LDLCALC 114 (H) 08/23/2011 07:43 AM    Wt Readings from Last 3 Encounters:  07/09/18 205 lb (93 kg)  12/29/16 208 lb 8 oz (94.6 kg)  10/03/16 208 lb 12 oz (94.7 kg)     Objective:    Vital Signs:  BP 119/83   Pulse 62   Ht 5\' 6"  (1.676 m)   Wt 205 lb (93 kg)   BMI 33.09 kg/m    VITAL SIGNS:  reviewed  ASSESSMENT & PLAN:    1.  Exertional dyspnea: She reports improvement in symptoms.  Negative cardiac work-up in the past.  No need for further studies at the present time.  2.  Essential hypertension: Blood pressure is controlled on lisinopril.  3 . Hyperlipidemia: Currently on rosuvastatin 10 mg daily.  Patient can follow-up with me as needed if symptoms worsen.  COVID-19 Education: The signs and symptoms of COVID-19 were discussed with the patient and how to seek care for testing (follow up with PCP or arrange E-visit).  The importance of social distancing was discussed today.  Time:   Today, I have spent 8 minutes with the patient with telehealth technology discussing the above problems.     Medication Adjustments/Labs and Tests Ordered: Current medicines are reviewed at length with the patient today.  Concerns regarding medicines are outlined above.   Tests Ordered: No orders of the defined types were placed in this encounter.   Medication Changes: No orders of the defined types were placed in this encounter.   Follow Up:   As needed  Signed, Kathlyn Sacramento, MD  07/09/2018 4:30 PM    [place]

## 2018-07-09 NOTE — Patient Instructions (Signed)
Medication Instructions:  No change in medications If you need a refill on your cardiac medications before your next appointment, please call your pharmacy.   Lab work: None If you have labs (blood work) drawn today and your tests are completely normal, you will receive your results only by: Marland Kitchen MyChart Message (if you have MyChart) OR . A paper copy in the mail If you have any lab test that is abnormal or we need to change your treatment, we will call you to review the results.  Testing/Procedures: None  Follow-Up: As needed

## 2018-07-16 ENCOUNTER — Other Ambulatory Visit: Payer: Self-pay | Admitting: Internal Medicine

## 2018-07-16 DIAGNOSIS — K5790 Diverticulosis of intestine, part unspecified, without perforation or abscess without bleeding: Secondary | ICD-10-CM | POA: Diagnosis not present

## 2018-07-16 DIAGNOSIS — R1032 Left lower quadrant pain: Secondary | ICD-10-CM

## 2018-07-16 DIAGNOSIS — R109 Unspecified abdominal pain: Secondary | ICD-10-CM | POA: Diagnosis not present

## 2018-07-23 ENCOUNTER — Ambulatory Visit
Admission: RE | Admit: 2018-07-23 | Discharge: 2018-07-23 | Disposition: A | Discharge status: Home / Self Care | Payer: Medicare Other | Source: Ambulatory Visit | Attending: Internal Medicine | Admitting: Internal Medicine

## 2018-07-23 ENCOUNTER — Other Ambulatory Visit: Payer: Self-pay

## 2018-07-23 DIAGNOSIS — R1032 Left lower quadrant pain: Secondary | ICD-10-CM | POA: Insufficient documentation

## 2018-07-23 DIAGNOSIS — K5730 Diverticulosis of large intestine without perforation or abscess without bleeding: Secondary | ICD-10-CM | POA: Diagnosis not present

## 2018-07-23 LAB — POCT I-STAT CREATININE: Creatinine, Ser: 0.9 mg/dL (ref 0.44–1.00)

## 2018-07-23 MED ORDER — IOHEXOL 300 MG/ML  SOLN
100.0000 mL | Freq: Once | INTRAMUSCULAR | Status: AC | PRN
  Administered 2018-07-23: 12:00:00 100 mL via INTRAVENOUS

## 2018-07-31 DIAGNOSIS — K5790 Diverticulosis of intestine, part unspecified, without perforation or abscess without bleeding: Secondary | ICD-10-CM | POA: Diagnosis not present

## 2018-07-31 DIAGNOSIS — Z885 Allergy status to narcotic agent status: Secondary | ICD-10-CM | POA: Diagnosis not present

## 2018-07-31 DIAGNOSIS — E038 Other specified hypothyroidism: Secondary | ICD-10-CM | POA: Diagnosis not present

## 2018-08-09 DIAGNOSIS — S93602A Unspecified sprain of left foot, initial encounter: Secondary | ICD-10-CM | POA: Diagnosis not present

## 2018-08-09 DIAGNOSIS — S7002XA Contusion of left hip, initial encounter: Secondary | ICD-10-CM | POA: Diagnosis not present

## 2018-12-23 DIAGNOSIS — M2242 Chondromalacia patellae, left knee: Secondary | ICD-10-CM | POA: Insufficient documentation

## 2019-01-13 DIAGNOSIS — H903 Sensorineural hearing loss, bilateral: Secondary | ICD-10-CM | POA: Diagnosis not present

## 2019-01-13 DIAGNOSIS — H6121 Impacted cerumen, right ear: Secondary | ICD-10-CM | POA: Diagnosis not present

## 2019-01-13 DIAGNOSIS — H9313 Tinnitus, bilateral: Secondary | ICD-10-CM | POA: Diagnosis not present

## 2019-01-17 DIAGNOSIS — Z1152 Encounter for screening for COVID-19: Secondary | ICD-10-CM | POA: Diagnosis not present

## 2019-05-27 ENCOUNTER — Other Ambulatory Visit: Payer: Self-pay | Admitting: Internal Medicine

## 2019-06-16 DIAGNOSIS — G4733 Obstructive sleep apnea (adult) (pediatric): Secondary | ICD-10-CM | POA: Diagnosis not present

## 2019-06-30 ENCOUNTER — Other Ambulatory Visit: Payer: Self-pay | Admitting: *Deleted

## 2019-06-30 MED ORDER — LISINOPRIL 10 MG PO TABS: 10.0000 mg | ORAL_TABLET | Freq: Every day | ORAL | 3 refills | Status: DC

## 2019-06-30 MED ORDER — ATORVASTATIN CALCIUM 20 MG PO TABS: 20.0000 mg | ORAL_TABLET | Freq: Every day | ORAL | 3 refills | Status: DC

## 2019-08-05 ENCOUNTER — Other Ambulatory Visit: Payer: Self-pay

## 2019-08-05 ENCOUNTER — Ambulatory Visit (INDEPENDENT_AMBULATORY_CARE_PROVIDER_SITE_OTHER): Payer: Medicare Other | Admitting: Internal Medicine

## 2019-08-05 ENCOUNTER — Encounter: Payer: Self-pay | Admitting: Internal Medicine

## 2019-08-05 VITALS — BP 123/77 | HR 64 | Ht 66.0 in | Wt 202.1 lb

## 2019-08-05 DIAGNOSIS — Z6832 Body mass index (BMI) 32.0-32.9, adult: Secondary | ICD-10-CM

## 2019-08-05 DIAGNOSIS — E6609 Other obesity due to excess calories: Secondary | ICD-10-CM | POA: Diagnosis not present

## 2019-08-05 DIAGNOSIS — E038 Other specified hypothyroidism: Secondary | ICD-10-CM | POA: Diagnosis not present

## 2019-08-05 DIAGNOSIS — K219 Gastro-esophageal reflux disease without esophagitis: Secondary | ICD-10-CM | POA: Insufficient documentation

## 2019-08-05 DIAGNOSIS — I10 Essential (primary) hypertension: Secondary | ICD-10-CM

## 2019-08-05 DIAGNOSIS — E063 Autoimmune thyroiditis: Secondary | ICD-10-CM

## 2019-08-05 DIAGNOSIS — Z0000 Encounter for general adult medical examination without abnormal findings: Secondary | ICD-10-CM

## 2019-08-05 DIAGNOSIS — F419 Anxiety disorder, unspecified: Secondary | ICD-10-CM | POA: Insufficient documentation

## 2019-08-05 MED ORDER — ALPRAZOLAM 0.5 MG PO TABS: 0.5000 mg | ORAL_TABLET | Freq: Two times a day (BID) | ORAL | 0 refills | Status: DC

## 2019-08-05 MED ORDER — LEVOTHYROXINE SODIUM 112 MCG PO TABS: 112.0000 ug | ORAL_TABLET | Freq: Every day | ORAL | 3 refills | Status: DC

## 2019-08-05 MED ORDER — LISINOPRIL 10 MG PO TABS: 10.0000 mg | ORAL_TABLET | Freq: Every day | ORAL | 3 refills | Status: DC

## 2019-08-05 MED ORDER — OMEPRAZOLE 20 MG PO CPDR: 20.0000 mg | DELAYED_RELEASE_CAPSULE | Freq: Every day | ORAL | 2 refills | Status: DC

## 2019-08-05 MED ORDER — ATORVASTATIN CALCIUM 20 MG PO TABS: 20.0000 mg | ORAL_TABLET | Freq: Every day | ORAL | 3 refills | Status: DC

## 2019-08-05 NOTE — Assessment & Plan Note (Signed)
The patient is stable. We will schedule a colonoscopy and mammogram. She should see the pharmacy to get the pneumonia shot. Her weight and blood pressure is stable.

## 2019-08-05 NOTE — Assessment & Plan Note (Signed)
-   Patient experiencing high levels of anxiety.  - Encouraged patient to engage in relaxing activities like yoga, meditation, journaling, going for a walk, or participating in a hobby.  - Encouraged patient to reach out to trusted friends or family members about recent struggles 

## 2019-08-05 NOTE — Assessment & Plan Note (Signed)
-   Today, the patient's blood pressure is well managed on Lisinopril. - The patient will continue the current treatment regimen.  - I encouraged the patient to eat a low-sodium diet to help control blood pressure. - I encouraged the patient to live an active lifestyle and complete activities that increases heart rate to 85% target heart rate at least 5 times per week for one hour.

## 2019-08-05 NOTE — Progress Notes (Signed)
Established Patient Office Visit  SUBJECTIVE:  Subjective  Patient ID: Melissa English, female    DOB: 1950/09/11  Age: 69 y.o. MRN: [id number]  CC:  Chief Complaint  Patient presents with  . Annual Exam    HPI Melissa English is a 69 y.o. female presenting today for annual physical. She states that she is taking all medications as prescribed and is doing well overall. She is using her CPAP machine regularly. No recent changes in weight. She has no complaints today.  She is fully vaccinated against COVID-19. Her last mammogram was two years ago and her last colonoscopy was approximately ten years ago.  Past Medical History:  Diagnosis Date  . Anxiety   . Arthritis 2018   hands, back...getting injections for back  . GERD (gastroesophageal reflux disease)   . HLD (hyperlipidemia)   . HTN (hypertension)   . Hypothyroidism   . Sleep apnea    uses cpap    Past Surgical History:  Procedure Laterality Date  . CARPAL TUNNEL RELEASE Right 2008   Dr. Derrel Nip  . CARPAL TUNNEL RELEASE Left 11/01/2016   Procedure: CARPAL TUNNEL RELEASE;  Surgeon: Earnestine Leys, MD;  Location: [facility] ORS;  Service: Orthopedics;  Laterality: Left;  . CHOLECYSTECTOMY    . LUMBAR DISC SURGERY  1998   L4 - L5.  no metal in back  . TOTAL VAGINAL HYSTERECTOMY  1998    History reviewed. No pertinent family history.  Social History   Socioeconomic History  . Marital status: Married    Spouse name: Not on file  . Number of children: 3  . Years of education: Not on file  . Highest education level: Not on file  Occupational History  . Occupation: housewife  Tobacco Use  . Smoking status: Never Smoker  . Smokeless tobacco: Never Used  Vaping Use  . Vaping Use: Never used  Substance and Sexual Activity  . Alcohol use: Yes    Comment: occasional wine or beer  . Drug use: No  . Sexual activity: Not on file  Other Topics Concern  . Not on file  Social History Narrative  . Not on file   Social  Determinants of Health   Financial Resource Strain:   . Difficulty of Paying Living Expenses:   Food Insecurity:   . Worried About Charity fundraiser in the Last Year:   . Arboriculturist in the Last Year:   Transportation Needs:   . Film/video editor (Medical):   Marland Kitchen Lack of Transportation (Non-Medical):   Physical Activity:   . Days of Exercise per Week:   . Minutes of Exercise per Session:   Stress:   . Feeling of Stress :   Social Connections:   . Frequency of Communication with Friends and Family:   . Frequency of Social Gatherings with Friends and Family:   . Attends Religious Services:   . Active Member of Clubs or Organizations:   . Attends Archivist Meetings:   Marland Kitchen Marital Status:   Intimate Partner Violence:   . Fear of Current or Ex-Partner:   . Emotionally Abused:   Marland Kitchen Physically Abused:   . Sexually Abused:      Current Outpatient Medications:  .  ALPRAZolam (XANAX) 0.5 MG tablet, Take 1 tablet (0.5 mg total) by mouth 2 (two) times daily., Disp: 180 tablet, Rfl: 0 .  aspirin 81 MG tablet, Take 81 mg by mouth daily.  , Disp: , Rfl:  .  atorvastatin (LIPITOR) 20 MG tablet, Take 1 tablet (20 mg total) by mouth daily., Disp: 90 tablet, Rfl: 3 .  Calcium-Magnesium-Zinc 333-133-5 MG TABS, Take by mouth daily. , Disp: , Rfl:  .  lisinopril (ZESTRIL) 10 MG tablet, Take 1 tablet (10 mg total) by mouth daily., Disp: 90 tablet, Rfl: 3 .  omeprazole (PRILOSEC) 20 MG capsule, Take 1 capsule (20 mg total) by mouth daily., Disp: 90 capsule, Rfl: 2 .  levothyroxine (SYNTHROID) 112 MCG tablet, Take 1 tablet (112 mcg total) by mouth daily., Disp: 90 tablet, Rfl: 3   Allergies  Allergen Reactions  . Codeine Nausea And Vomiting and Other (See Comments)    Severe n & v Severe n & v  . Sulfa Antibiotics Nausea And Vomiting    Occurred a long time ago so patient is unsure of reaction  . Other Other (See Comments)  . Sulfasalazine   . Prednisone Other (See Comments)     Could not sleep and felt nervous.    ROS Review of Systems  Constitutional: Negative.   HENT: Negative.   Eyes: Negative.   Respiratory: Negative.   Cardiovascular: Negative.   Gastrointestinal: Negative.   Endocrine: Negative.   Genitourinary: Negative.   Musculoskeletal: Negative.   Skin: Negative.   Allergic/Immunologic: Negative.   Neurological: Negative.   Hematological: Negative.   Psychiatric/Behavioral: Negative.   All other systems reviewed and are negative.    OBJECTIVE:    Physical Exam Vitals reviewed.  Constitutional:      Appearance: Normal appearance.  HENT:     Mouth/Throat:     Mouth: Mucous membranes are moist.  Eyes:     Pupils: Pupils are equal, round, and reactive to light.  Neck:     Vascular: No carotid bruit.     Comments: Trachea is centered. No goiter. Cardiovascular:     Rate and Rhythm: Normal rate and regular rhythm.     Pulses: Normal pulses.     Heart sounds: Normal heart sounds.  Pulmonary:     Effort: Pulmonary effort is normal.     Breath sounds: Normal breath sounds.  Abdominal:     General: Bowel sounds are normal.     Palpations: Abdomen is soft. There is no hepatomegaly, splenomegaly or mass.     Tenderness: There is no abdominal tenderness. There is no right CVA tenderness or left CVA tenderness.     Hernia: No hernia is present.  Musculoskeletal:        General: No tenderness.     Cervical back: Neck supple.     Right lower leg: No edema.     Left lower leg: No edema.  Lymphadenopathy:     Cervical: No cervical adenopathy.  Skin:    Findings: No rash.  Neurological:     Mental Status: She is alert and oriented to person, place, and time.     Motor: No weakness.  Psychiatric:        Mood and Affect: Mood and affect normal.        Behavior: Behavior normal.   Breast: Bilateral breasts appear normal. No palpable masses. No tenderness.  BP 123/77   Pulse 64   Ht 5\' 6"  (1.676 m)   Wt 202 lb 1.6 oz (91.7 kg)    BMI 32.62 kg/m  Wt Readings from Last 3 Encounters:  08/05/19 202 lb 1.6 oz (91.7 kg)  07/09/18 205 lb (93 kg)  12/29/16 208 lb 8 oz (94.6 kg)    Health Maintenance  Due  Topic Date Due  . Hepatitis C Screening  Never done  . COVID-19 Vaccine (1) Never done  . TETANUS/TDAP  Never done  . MAMMOGRAM  Never done  . COLONOSCOPY  Never done  . DEXA SCAN  Never done  . PNA vac Low Risk Adult (1 of 2 - PCV13) Never done    There are no preventive care reminders to display for this patient.  CBC Latest Ref Rng & Units 09/16/2012 02/28/2012 06/16/2011  WBC 3.6 - 11.0 x10 3/mm 3 6.1 9.7 5.8  Hemoglobin 12.0 - 16.0 g/dL 13.4 14.0 13.4  Hematocrit 35.0 - 47.0 % 38.8 42.5 40.2  Platelets 150 - 440 x10 3/mm 3 156 186 146(L)   CMP Latest Ref Rng & Units 07/23/2018 09/16/2012 02/28/2012  Glucose 65 - 99 mg/dL - 108(H) 94  BUN 7 - 18 mg/dL - 7 17  Creatinine 0.44 - 1.00 mg/dL 0.90 0.78 1.10  Sodium 136 - 145 mmol/L - 140 139  Potassium 3.5 - 5.1 mmol/L - 4.0 4.5  Chloride 98 - 107 mmol/L - 107 104  CO2 21 - 32 mmol/L - 30 27  Calcium 8.5 - 10.1 mg/dL - 9.2 9.3  Total Protein 6.4 - 8.2 g/dL - - -  Total Bilirubin 0.2 - 1.0 mg/dL - - -  Alkaline Phos 50 - 136 Unit/L - - -  AST 15 - 37 Unit/L - - -  ALT U/L - - -    Lab Results  Component Value Date   TSH 5.47 (H) 06/16/2011   Lab Results  Component Value Date   ALBUMIN 3.9 06/16/2011   ANIONGAP 3 (L) 09/16/2012   Lab Results  Component Value Date   CHOL 193 08/23/2011   HDL 55 08/23/2011   LDLCALC 114 (H) 08/23/2011   Lab Results  Component Value Date   TRIG 121 08/23/2011   No results found for: HGBA1C    ASSESSMENT & PLAN:   Problem List Items Addressed This Visit      Cardiovascular and Mediastinum   Hypertension - Primary    - Today, the patient's blood pressure is well managed on Lisinopril. - The patient will continue the current treatment regimen.  - I encouraged the patient to eat a low-sodium diet to help  control blood pressure. - I encouraged the patient to live an active lifestyle and complete activities that increases heart rate to 85% target heart rate at least 5 times per week for one hour.        Relevant Medications   atorvastatin (LIPITOR) 20 MG tablet   lisinopril (ZESTRIL) 10 MG tablet     Digestive   Gastroesophageal reflux disease without esophagitis    - The patient's GERD is stable on medication.  - Instructed the patient to avoid eating spicy and acidic foods, as well as foods high in fat. - Instructed the patient to avoid eating large meals or meals 2-3 hours prior to sleeping.       Relevant Medications   omeprazole (PRILOSEC) 20 MG capsule     Endocrine   Hypothyroidism due to Hashimoto's thyroiditis    The patient is taking Synthroid 112 micrograms daily. Will check a TSH today.       Relevant Medications   levothyroxine (SYNTHROID) 112 MCG tablet     Other   Class 1 obesity due to excess calories without serious comorbidity with body mass index (BMI) of 32.0 to 32.9 in adult   Anxiety    -  Patient experiencing high levels of anxiety.  - Encouraged patient to engage in relaxing activities like yoga, meditation, journaling, going for a walk, or participating in a hobby.  - Encouraged patient to reach out to trusted friends or family members about recent struggles       Relevant Medications   ALPRAZolam (XANAX) 0.5 MG tablet   Annual physical exam    The patient is stable. We will schedule a colonoscopy and mammogram. She should see the pharmacy to get the pneumonia shot. Her weight and blood pressure is stable.       Relevant Orders   MM 3D SCREEN BREAST BILATERAL   Ambulatory referral to Gastroenterology      Meds ordered this encounter  Medications  . atorvastatin (LIPITOR) 20 MG tablet    Sig: Take 1 tablet (20 mg total) by mouth daily.    Dispense:  90 tablet    Refill:  3  . levothyroxine (SYNTHROID) 112 MCG tablet    Sig: Take 1 tablet  (112 mcg total) by mouth daily.    Dispense:  90 tablet    Refill:  3    Requesting 1 year supply  . lisinopril (ZESTRIL) 10 MG tablet    Sig: Take 1 tablet (10 mg total) by mouth daily.    Dispense:  90 tablet    Refill:  3  . omeprazole (PRILOSEC) 20 MG capsule    Sig: Take 1 capsule (20 mg total) by mouth daily.    Dispense:  90 capsule    Refill:  2  . ALPRAZolam (XANAX) 0.5 MG tablet    Sig: Take 1 tablet (0.5 mg total) by mouth 2 (two) times daily.    Dispense:  180 tablet    Refill:  0    Follow-up: Return in about 1 week (around 08/12/2019).    Dr. Jane Canary [facility] [address], [place], [place] [address]   By signing my name below, I, Clerance Lav, attest that this documentation has been prepared under the direction and in the presence of Cletis Athens, MD. Electronically Signed: Cletis Athens, MD 08/05/19, 10:21 AM   I personally performed the services described in this documentation, which was SCRIBED in my presence. The recorded information has been reviewed and considered accurate. It has been edited as necessary during review. Cletis Athens, MD

## 2019-08-05 NOTE — Assessment & Plan Note (Signed)
-   The patient's GERD is stable on medication.  - Instructed the patient to avoid eating spicy and acidic foods, as well as foods high in fat. - Instructed the patient to avoid eating large meals or meals 2-3 hours prior to sleeping. 

## 2019-08-05 NOTE — Assessment & Plan Note (Signed)
The patient is taking Synthroid 112 micrograms daily. Will check a TSH today.

## 2019-08-06 LAB — CBC WITH DIFFERENTIAL/PLATELET
Absolute Monocytes: 558 cells/uL (ref 200–950)
Basophils Absolute: 61 cells/uL (ref 0–200)
Basophils Relative: 0.9 %
Eosinophils Absolute: 177 cells/uL (ref 15–500)
Eosinophils Relative: 2.6 %
HCT: 40.6 % (ref 35.0–45.0)
Hemoglobin: 13.3 g/dL (ref 11.7–15.5)
Lymphs Abs: 1550 cells/uL (ref 850–3900)
MCH: 31.4 pg (ref 27.0–33.0)
MCHC: 32.8 g/dL (ref 32.0–36.0)
MCV: 95.8 fL (ref 80.0–100.0)
MPV: 11.9 fL (ref 7.5–12.5)
Monocytes Relative: 8.2 %
Neutro Abs: 4454 cells/uL (ref 1500–7800)
Neutrophils Relative %: 65.5 %
Platelets: 172 10*3/uL (ref 140–400)
RBC: 4.24 10*6/uL (ref 3.80–5.10)
RDW: 12.6 % (ref 11.0–15.0)
Total Lymphocyte: 22.8 %
WBC: 6.8 10*3/uL (ref 3.8–10.8)

## 2019-08-06 LAB — COMPLETE METABOLIC PANEL WITH GFR
AG Ratio: 1.8 (calc) (ref 1.0–2.5)
ALT: 18 U/L (ref 6–29)
AST: 19 U/L (ref 10–35)
Albumin: 4.4 g/dL (ref 3.6–5.1)
Alkaline phosphatase (APISO): 113 U/L (ref 37–153)
BUN: 16 mg/dL (ref 7–25)
CO2: 28 mmol/L (ref 20–32)
Calcium: 9.6 mg/dL (ref 8.6–10.4)
Chloride: 103 mmol/L (ref 98–110)
Creat: 0.82 mg/dL (ref 0.60–0.93)
GFR, Est African American: 84 mL/min/{1.73_m2} (ref >=60)
GFR, Est Non African American: 72 mL/min/{1.73_m2} (ref >=60)
Globulin: 2.5 g/dL (calc) (ref 1.9–3.7)
Glucose, Bld: 117 mg/dL — ABNORMAL HIGH (ref 65–99)
Potassium: 4.6 mmol/L (ref 3.5–5.3)
Sodium: 140 mmol/L (ref 135–146)
Total Bilirubin: 1 mg/dL (ref 0.2–1.2)
Total Protein: 6.9 g/dL (ref 6.1–8.1)

## 2019-08-06 LAB — TSH: TSH: 0.13 mIU/L — ABNORMAL LOW (ref 0.40–4.50)

## 2019-08-06 LAB — LIPID PANEL
Cholesterol: 185 mg/dL (ref <200)
HDL: 62 mg/dL (ref >=50)
LDL Cholesterol (Calc): 101 mg/dL (calc) — ABNORMAL HIGH
Non-HDL Cholesterol (Calc): 123 mg/dL (calc) (ref <130)
Total CHOL/HDL Ratio: 3 (calc) (ref <5.0)
Triglycerides: 121 mg/dL (ref <150)

## 2019-09-03 ENCOUNTER — Ambulatory Visit (INDEPENDENT_AMBULATORY_CARE_PROVIDER_SITE_OTHER): Payer: Medicare Other | Admitting: Internal Medicine

## 2019-09-03 ENCOUNTER — Encounter: Payer: Self-pay | Admitting: Internal Medicine

## 2019-09-03 ENCOUNTER — Other Ambulatory Visit: Payer: Self-pay

## 2019-09-03 VITALS — BP 129/88 | HR 63 | Ht 66.0 in | Wt 203.8 lb

## 2019-09-03 DIAGNOSIS — E6609 Other obesity due to excess calories: Secondary | ICD-10-CM

## 2019-09-03 DIAGNOSIS — E063 Autoimmune thyroiditis: Secondary | ICD-10-CM

## 2019-09-03 DIAGNOSIS — E038 Other specified hypothyroidism: Secondary | ICD-10-CM | POA: Diagnosis not present

## 2019-09-03 DIAGNOSIS — I10 Essential (primary) hypertension: Secondary | ICD-10-CM | POA: Diagnosis not present

## 2019-09-03 DIAGNOSIS — F419 Anxiety disorder, unspecified: Secondary | ICD-10-CM | POA: Diagnosis not present

## 2019-09-03 DIAGNOSIS — Z6832 Body mass index (BMI) 32.0-32.9, adult: Secondary | ICD-10-CM

## 2019-09-03 DIAGNOSIS — K219 Gastro-esophageal reflux disease without esophagitis: Secondary | ICD-10-CM

## 2019-09-03 MED ORDER — ALPRAZOLAM 0.5 MG PO TABS: 0.5000 mg | ORAL_TABLET | Freq: Two times a day (BID) | ORAL | 0 refills | Status: AC

## 2019-09-03 NOTE — Progress Notes (Signed)
Established Patient Office Visit  SUBJECTIVE:  Subjective  Patient ID: Melissa English, female    DOB: 1950/06/18  Age: 69 y.o. MRN: [id number]  CC:  Chief Complaint  Patient presents with  . lab results   Lab results from 08/05/2019 of CBC w/diff and CMP is as follows: all values are within normal limits except for glucose at 117. TSH was abnormal at 0.13. Lipid panel was within normal limits except for LDL cholesterol at 101.    Past Medical History:  Diagnosis Date  . Anxiety   . Arthritis 2018   hands, back...getting injections for back  . GERD (gastroesophageal reflux disease)   . HLD (hyperlipidemia)   . HTN (hypertension)   . Hypothyroidism   . Sleep apnea    uses cpap    Past Surgical History:  Procedure Laterality Date  . CARPAL TUNNEL RELEASE Right 2008   Dr. Derrel Nip  . CARPAL TUNNEL RELEASE Left 11/01/2016   Procedure: CARPAL TUNNEL RELEASE;  Surgeon: Earnestine Leys, MD;  Location: [facility] ORS;  Service: Orthopedics;  Laterality: Left;  . CHOLECYSTECTOMY    . LUMBAR DISC SURGERY  1998   L4 - L5.  no metal in back  . TOTAL VAGINAL HYSTERECTOMY  1998    History reviewed. No pertinent family history.  Social History   Socioeconomic History  . Marital status: Married    Spouse name: Not on file  . Number of children: 3  . Years of education: Not on file  . Highest education level: Not on file  Occupational History  . Occupation: housewife  Tobacco Use  . Smoking status: Never Smoker  . Smokeless tobacco: Never Used  Vaping Use  . Vaping Use: Never used  Substance and Sexual Activity  . Alcohol use: Yes    Comment: occasional wine or beer  . Drug use: No  . Sexual activity: Not on file  Other Topics Concern  . Not on file  Social History Narrative  . Not on file   Social Determinants of Health   Financial Resource Strain:   . Difficulty of Paying Living Expenses: Not on file  Food Insecurity:   . Worried About Charity fundraiser in the Last  Year: Not on file  . Ran Out of Food in the Last Year: Not on file  Transportation Needs:   . Lack of Transportation (Medical): Not on file  . Lack of Transportation (Non-Medical): Not on file  Physical Activity:   . Days of Exercise per Week: Not on file  . Minutes of Exercise per Session: Not on file  Stress:   . Feeling of Stress : Not on file  Social Connections:   . Frequency of Communication with Friends and Family: Not on file  . Frequency of Social Gatherings with Friends and Family: Not on file  . Attends Religious Services: Not on file  . Active Member of Clubs or Organizations: Not on file  . Attends Archivist Meetings: Not on file  . Marital Status: Not on file  Intimate Partner Violence:   . Fear of Current or Ex-Partner: Not on file  . Emotionally Abused: Not on file  . Physically Abused: Not on file  . Sexually Abused: Not on file     Current Outpatient Medications:  .  ALPRAZolam (XANAX) 0.5 MG tablet, Take 1 tablet (0.5 mg total) by mouth 2 (two) times daily., Disp: 180 tablet, Rfl: 0 .  aspirin 81 MG tablet, Take 81 mg by  mouth daily.  , Disp: , Rfl:  .  atorvastatin (LIPITOR) 20 MG tablet, Take 1 tablet (20 mg total) by mouth daily., Disp: 90 tablet, Rfl: 3 .  Calcium-Magnesium-Zinc 333-133-5 MG TABS, Take by mouth daily. , Disp: , Rfl:  .  levothyroxine (SYNTHROID) 112 MCG tablet, Take 1 tablet (112 mcg total) by mouth daily., Disp: 90 tablet, Rfl: 3 .  lisinopril (ZESTRIL) 10 MG tablet, Take 1 tablet (10 mg total) by mouth daily., Disp: 90 tablet, Rfl: 3 .  omeprazole (PRILOSEC) 20 MG capsule, Take 1 capsule (20 mg total) by mouth daily., Disp: 90 capsule, Rfl: 2   Allergies  Allergen Reactions  . Codeine Nausea And Vomiting and Other (See Comments)    Severe n & v Severe n & v  . Sulfa Antibiotics Nausea And Vomiting    Occurred a long time ago so patient is unsure of reaction  . Other Other (See Comments)  . Sulfasalazine   . Prednisone  Other (See Comments)    Could not sleep and felt nervous.    ROS Review of Systems  Constitutional: Negative.   HENT: Negative.   Eyes: Negative.   Respiratory: Negative.   Cardiovascular: Negative.   Gastrointestinal: Negative.   Endocrine: Negative.   Genitourinary: Negative.   Musculoskeletal: Negative.   Skin: Negative.   Allergic/Immunologic: Negative.   Neurological: Negative.   Hematological: Negative.   Psychiatric/Behavioral: Negative.   All other systems reviewed and are negative.    OBJECTIVE:    Physical Exam Vitals reviewed.  Constitutional:      Appearance: Normal appearance.  HENT:     Mouth/Throat:     Mouth: Mucous membranes are moist.  Eyes:     Pupils: Pupils are equal, round, and reactive to light.  Neck:     Vascular: No carotid bruit.  Cardiovascular:     Rate and Rhythm: Normal rate and regular rhythm.     Pulses: Normal pulses.     Heart sounds: Normal heart sounds.  Pulmonary:     Effort: Pulmonary effort is normal.     Breath sounds: Normal breath sounds.  Abdominal:     General: Bowel sounds are normal.     Palpations: Abdomen is soft. There is no hepatomegaly, splenomegaly or mass.     Tenderness: There is no abdominal tenderness.     Hernia: No hernia is present.  Musculoskeletal:        General: No tenderness.     Cervical back: Neck supple.     Right lower leg: No edema.     Left lower leg: No edema.  Skin:    Findings: No rash.  Neurological:     Mental Status: She is alert and oriented to person, place, and time.     Motor: No weakness.  Psychiatric:        Mood and Affect: Mood and affect normal.        Behavior: Behavior normal.     BP 129/88   Pulse 63   Ht 5\' 6"  (1.676 m)   Wt 203 lb 12.8 oz (92.4 kg)   BMI 32.89 kg/m  Wt Readings from Last 3 Encounters:  09/03/19 203 lb 12.8 oz (92.4 kg)  08/05/19 202 lb 1.6 oz (91.7 kg)  07/09/18 205 lb (93 kg)    Health Maintenance Due  Topic Date Due  . Hepatitis C  Screening  Never done  . COVID-19 Vaccine (1) Never done  . TETANUS/TDAP  Never done  .  MAMMOGRAM  Never done  . COLONOSCOPY  Never done  . DEXA SCAN  Never done  . PNA vac Low Risk Adult (1 of 2 - PCV13) Never done  . INFLUENZA VACCINE  08/10/2019    There are no preventive care reminders to display for this patient.  CBC Latest Ref Rng & Units 08/05/2019 09/16/2012 02/28/2012  WBC 3.8 - 10.8 Thousand/uL 6.8 6.1 9.7  Hemoglobin 11.7 - 15.5 g/dL 13.3 13.4 14.0  Hematocrit 35 - 45 % 40.6 38.8 42.5  Platelets 140 - 400 Thousand/uL 172 156 186   CMP Latest Ref Rng & Units 08/05/2019 07/23/2018 09/16/2012  Glucose 65 - 99 mg/dL 117(H) - 108(H)  BUN 7 - 25 mg/dL 16 - 7  Creatinine 0.60 - 0.93 mg/dL 0.82 0.90 0.78  Sodium 135 - 146 mmol/L 140 - 140  Potassium 3.5 - 5.3 mmol/L 4.6 - 4.0  Chloride 98 - 110 mmol/L 103 - 107  CO2 20 - 32 mmol/L 28 - 30  Calcium 8.6 - 10.4 mg/dL 9.6 - 9.2  Total Protein 6.1 - 8.1 g/dL 6.9 - -  Total Bilirubin 0.2 - 1.2 mg/dL 1.0 - -  Alkaline Phos 50 - 136 Unit/L - - -  AST 10 - 35 U/L 19 - -  ALT 6 - 29 U/L 18 - -    Lab Results  Component Value Date   TSH 0.13 (L) 08/05/2019   Lab Results  Component Value Date   ALBUMIN 3.9 06/16/2011   ANIONGAP 3 (L) 09/16/2012   Lab Results  Component Value Date   CHOL 185 08/05/2019   CHOL 193 08/23/2011   HDL 62 08/05/2019   HDL 55 08/23/2011   LDLCALC 101 (H) 08/05/2019   LDLCALC 114 (H) 08/23/2011   CHOLHDL 3.0 08/05/2019   Lab Results  Component Value Date   TRIG 121 08/05/2019   No results found for: HGBA1C    ASSESSMENT & PLAN:   Problem List Items Addressed This Visit      Cardiovascular and Mediastinum   Hypertension    - Today, the patient's blood pressure is well managed on lisinopril. - The patient will continue the current treatment regimen.  - I encouraged the patient to eat a low-sodium diet to help control blood pressure. - I encouraged the patient to live an active lifestyle and  complete activities that increases heart rate to 85% target heart rate at least 5 times per week for one hour.            Digestive   Gastroesophageal reflux disease without esophagitis     Endocrine   Hypothyroidism due to Hashimoto's thyroiditis    tsh is stable        Other   Class 1 obesity due to excess calories without serious comorbidity with body mass index (BMI) of 32.0 to 32.9 in adult    - I encouraged the patient to lose weight.  - I educated them on making healthy dietary choices including eating more fruits and vegetables and less fried foods. - I encouraged the patient to exercise more, and educated on the benefits of exercise including weight loss, diabetes management, and hypertension management.       Anxiety - Primary    - Patient experiencing high levels of anxiety.  - Encouraged patient to engage in relaxing activities like yoga, meditation, journaling, going for a walk, or participating in a hobby.  - Encouraged patient to reach out to trusted friends or family members about  recent struggles       Relevant Medications   ALPRAZolam (XANAX) 0.5 MG tablet      Meds ordered this encounter  Medications  . ALPRAZolam (XANAX) 0.5 MG tablet    Sig: Take 1 tablet (0.5 mg total) by mouth 2 (two) times daily.    Dispense:  180 tablet    Refill:  0      Follow-up: No follow-ups on file.    Dr. Jane Canary [facility] [address] [place], [place] [address]   By signing my name below, I, [employer], attest that this documentation has been prepared under the direction of Cletis Athens, MD. Electronically Signed: Cletis Athens, MD 09/03/19, 5:07 PM   I personally performed the services described in this documentation, which was SCRIBED in my presence. The recorded information has been reviewed and considered accurate. It has been edited as necessary during review. Cletis Athens, MD

## 2019-09-03 NOTE — Assessment & Plan Note (Signed)
-   Today, the patient's blood pressure is well managed on lisinopril. - The patient will continue the current treatment regimen.  - I encouraged the patient to eat a low-sodium diet to help control blood pressure. - I encouraged the patient to live an active lifestyle and complete activities that increases heart rate to 85% target heart rate at least 5 times per week for one hour.     

## 2019-09-03 NOTE — Assessment & Plan Note (Signed)
tsh is stable

## 2019-09-03 NOTE — Assessment & Plan Note (Signed)
-   I encouraged the patient to lose weight.  - I educated them on making healthy dietary choices including eating more fruits and vegetables and less fried foods. - I encouraged the patient to exercise more, and educated on the benefits of exercise including weight loss, diabetes management, and hypertension management.   

## 2019-09-03 NOTE — Assessment & Plan Note (Signed)
-   Patient experiencing high levels of anxiety.  - Encouraged patient to engage in relaxing activities like yoga, meditation, journaling, going for a walk, or participating in a hobby.  - Encouraged patient to reach out to trusted friends or family members about recent struggles 

## 2019-09-11 ENCOUNTER — Encounter: Payer: Self-pay | Admitting: Gastroenterology

## 2019-09-11 ENCOUNTER — Telehealth: Payer: Medicare Other

## 2019-09-19 ENCOUNTER — Telehealth (INDEPENDENT_AMBULATORY_CARE_PROVIDER_SITE_OTHER): Payer: Medicare Other | Admitting: Gastroenterology

## 2019-09-19 ENCOUNTER — Other Ambulatory Visit: Payer: Self-pay

## 2019-09-19 DIAGNOSIS — Z1211 Encounter for screening for malignant neoplasm of colon: Secondary | ICD-10-CM

## 2019-09-19 NOTE — Progress Notes (Signed)
Gastroenterology Pre-Procedure Review  Request Date: Friday 10/03/19 Requesting Physician: Dr. Bonna Gains  PATIENT REVIEW QUESTIONS: The patient responded to the following health history questions as indicated:    1. Are you having any GI issues? no 2. Do you have a personal history of Polyps? no 3. Do you have a family history of Colon Cancer or Polyps? no 4. Diabetes Mellitus? no 5. Joint replacements in the past 12 months?no 6. Major health problems in the past 3 months?no 7. Any artificial heart valves, MVP, or defibrillator?no    MEDICATIONS & ALLERGIES:    Patient reports the following regarding taking any anticoagulation/antiplatelet therapy:   Plavix, Coumadin, Eliquis, Xarelto, Lovenox, Pradaxa, Brilinta, or Effient? no Aspirin? yes (81 mg daily)  Patient confirms/reports the following medications:  Current Outpatient Medications  Medication Sig Dispense Refill  . acetaminophen (TYLENOL) 650 MG CR tablet 1 tab as needed for pain, 2- 3 times/days, 90 days    . ALPRAZolam (XANAX) 0.5 MG tablet Take 1 tablet (0.5 mg total) by mouth 2 (two) times daily. 180 tablet 0  . aspirin 81 MG tablet Take 81 mg by mouth daily.      Marland Kitchen atorvastatin (LIPITOR) 20 MG tablet Take 1 tablet (20 mg total) by mouth daily. 90 tablet 3  . Calcium-Magnesium-Zinc 333-133-5 MG TABS Take by mouth daily.     Marland Kitchen gabapentin (NEURONTIN) 300 MG capsule gabapentin 300 mg capsule    . levothyroxine (SYNTHROID) 112 MCG tablet Take 1 tablet (112 mcg total) by mouth daily. 90 tablet 3  . lisinopril (ZESTRIL) 10 MG tablet Take 1 tablet (10 mg total) by mouth daily. 90 tablet 3  . omeprazole (PRILOSEC) 20 MG capsule Take 1 capsule (20 mg total) by mouth daily. 90 capsule 2   No current facility-administered medications for this visit.    Patient confirms/reports the following allergies:  Allergies  Allergen Reactions  . Codeine Nausea And Vomiting and Other (See Comments)    Severe n & v Severe n & v  . Sulfa  Antibiotics Nausea And Vomiting    Occurred a long time ago so patient is unsure of reaction  . Other Other (See Comments)  . Sulfasalazine   . Prednisone Other (See Comments)    Could not sleep and felt nervous.    No orders of the defined types were placed in this encounter.   AUTHORIZATION INFORMATION Primary Insurance: 1D#: Group #:  Secondary Insurance: 1D#: Group #:  SCHEDULE INFORMATION: Date: Friday 10/03/19 Time: Location:[facility]

## 2019-09-24 ENCOUNTER — Inpatient Hospital Stay: Admission: RE | Admit: 2019-09-24 | Payer: Medicare Other | Source: Ambulatory Visit

## 2019-10-01 ENCOUNTER — Other Ambulatory Visit
Admission: RE | Admit: 2019-10-01 | Discharge: 2019-10-01 | Disposition: A | Discharge status: Home / Self Care | Payer: Medicare Other | Source: Ambulatory Visit | Attending: Gastroenterology | Admitting: Gastroenterology

## 2019-10-01 ENCOUNTER — Telehealth: Payer: Self-pay

## 2019-10-01 ENCOUNTER — Other Ambulatory Visit: Payer: Self-pay

## 2019-10-01 DIAGNOSIS — Z20822 Contact with and (suspected) exposure to covid-19: Secondary | ICD-10-CM | POA: Insufficient documentation

## 2019-10-01 DIAGNOSIS — Z01812 Encounter for preprocedural laboratory examination: Secondary | ICD-10-CM | POA: Diagnosis not present

## 2019-10-01 LAB — SARS CORONAVIRUS 2 (TAT 6-24 HRS): SARS Coronavirus 2: NEGATIVE

## 2019-10-01 MED ORDER — PEG 3350-KCL-NA BICARB-NACL 420 G PO SOLR: ORAL | 0 refills | Status: DC

## 2019-10-01 NOTE — Telephone Encounter (Signed)
Called patient to inform her a new prescription was sent in for prep. Left a voicemail to let her know and if she has any issues to give our office a call.

## 2019-10-01 NOTE — Addendum Note (Signed)
Addended by: Storm Frisk on: 10/01/2019 04:04 PM   Modules accepted: Orders

## 2019-10-03 ENCOUNTER — Ambulatory Visit: Payer: Medicare Other | Admitting: Certified Registered Nurse Anesthetist

## 2019-10-03 ENCOUNTER — Encounter: Payer: Self-pay | Admitting: Gastroenterology

## 2019-10-03 ENCOUNTER — Other Ambulatory Visit: Payer: Self-pay

## 2019-10-03 ENCOUNTER — Ambulatory Visit
Admission: RE | Admit: 2019-10-03 | Discharge: 2019-10-03 | Disposition: A | Discharge status: Home / Self Care | Payer: Medicare Other | Attending: Gastroenterology | Admitting: Gastroenterology

## 2019-10-03 ENCOUNTER — Encounter
Admission: RE | Disposition: A | Discharge status: Home / Self Care | Payer: Self-pay | Source: Home / Self Care | Attending: Gastroenterology

## 2019-10-03 DIAGNOSIS — Z882 Allergy status to sulfonamides status: Secondary | ICD-10-CM | POA: Insufficient documentation

## 2019-10-03 DIAGNOSIS — Z79890 Hormone replacement therapy (postmenopausal): Secondary | ICD-10-CM | POA: Insufficient documentation

## 2019-10-03 DIAGNOSIS — K5730 Diverticulosis of large intestine without perforation or abscess without bleeding: Secondary | ICD-10-CM | POA: Diagnosis not present

## 2019-10-03 DIAGNOSIS — Z7982 Long term (current) use of aspirin: Secondary | ICD-10-CM | POA: Insufficient documentation

## 2019-10-03 DIAGNOSIS — K644 Residual hemorrhoidal skin tags: Secondary | ICD-10-CM | POA: Diagnosis not present

## 2019-10-03 DIAGNOSIS — K635 Polyp of colon: Secondary | ICD-10-CM | POA: Diagnosis not present

## 2019-10-03 DIAGNOSIS — F419 Anxiety disorder, unspecified: Secondary | ICD-10-CM | POA: Insufficient documentation

## 2019-10-03 DIAGNOSIS — I10 Essential (primary) hypertension: Secondary | ICD-10-CM | POA: Insufficient documentation

## 2019-10-03 DIAGNOSIS — Z1211 Encounter for screening for malignant neoplasm of colon: Secondary | ICD-10-CM

## 2019-10-03 DIAGNOSIS — K219 Gastro-esophageal reflux disease without esophagitis: Secondary | ICD-10-CM | POA: Insufficient documentation

## 2019-10-03 DIAGNOSIS — Z885 Allergy status to narcotic agent status: Secondary | ICD-10-CM | POA: Diagnosis not present

## 2019-10-03 DIAGNOSIS — M479 Spondylosis, unspecified: Secondary | ICD-10-CM | POA: Diagnosis not present

## 2019-10-03 DIAGNOSIS — M19041 Primary osteoarthritis, right hand: Secondary | ICD-10-CM | POA: Insufficient documentation

## 2019-10-03 DIAGNOSIS — E785 Hyperlipidemia, unspecified: Secondary | ICD-10-CM | POA: Diagnosis not present

## 2019-10-03 DIAGNOSIS — E039 Hypothyroidism, unspecified: Secondary | ICD-10-CM | POA: Insufficient documentation

## 2019-10-03 DIAGNOSIS — Z7689 Persons encountering health services in other specified circumstances: Secondary | ICD-10-CM | POA: Diagnosis not present

## 2019-10-03 DIAGNOSIS — D120 Benign neoplasm of cecum: Secondary | ICD-10-CM | POA: Diagnosis not present

## 2019-10-03 DIAGNOSIS — D125 Benign neoplasm of sigmoid colon: Secondary | ICD-10-CM | POA: Insufficient documentation

## 2019-10-03 DIAGNOSIS — M19042 Primary osteoarthritis, left hand: Secondary | ICD-10-CM | POA: Insufficient documentation

## 2019-10-03 DIAGNOSIS — G4730 Sleep apnea, unspecified: Secondary | ICD-10-CM | POA: Diagnosis not present

## 2019-10-03 DIAGNOSIS — K5790 Diverticulosis of intestine, part unspecified, without perforation or abscess without bleeding: Secondary | ICD-10-CM | POA: Diagnosis not present

## 2019-10-03 DIAGNOSIS — Z9049 Acquired absence of other specified parts of digestive tract: Secondary | ICD-10-CM | POA: Diagnosis not present

## 2019-10-03 DIAGNOSIS — Z79899 Other long term (current) drug therapy: Secondary | ICD-10-CM | POA: Diagnosis not present

## 2019-10-03 HISTORY — PX: COLONOSCOPY WITH PROPOFOL: SHX5780

## 2019-10-03 SURGERY — COLONOSCOPY WITH PROPOFOL
Anesthesia: General

## 2019-10-03 MED ORDER — PROPOFOL 500 MG/50ML IV EMUL
INTRAVENOUS | Status: DC | PRN
  Administered 2019-10-03: 175 ug/kg/min via INTRAVENOUS

## 2019-10-03 MED ORDER — PROPOFOL 10 MG/ML IV BOLUS
INTRAVENOUS | Status: DC | PRN
  Administered 2019-10-03: 60 mg via INTRAVENOUS
  Administered 2019-10-03: 20 mg via INTRAVENOUS

## 2019-10-03 MED ORDER — PROPOFOL 10 MG/ML IV BOLUS
INTRAVENOUS | Status: AC
  Filled 2019-10-03: qty 20

## 2019-10-03 MED ORDER — LIDOCAINE HCL (CARDIAC) PF 100 MG/5ML IV SOSY
PREFILLED_SYRINGE | INTRAVENOUS | Status: DC | PRN
  Administered 2019-10-03: 50 mg via INTRAVENOUS

## 2019-10-03 MED ORDER — LIDOCAINE HCL (PF) 2 % IJ SOLN
INTRAMUSCULAR | Status: AC
  Filled 2019-10-03: qty 5

## 2019-10-03 MED ORDER — ONDANSETRON HCL 4 MG/2ML IJ SOLN
INTRAMUSCULAR | Status: DC | PRN
  Administered 2019-10-03: 4 mg via INTRAVENOUS

## 2019-10-03 MED ORDER — PHENYLEPHRINE HCL (PRESSORS) 10 MG/ML IV SOLN
INTRAVENOUS | Status: DC | PRN
  Administered 2019-10-03 (×2): 200 ug via INTRAVENOUS
  Administered 2019-10-03: 100 ug via INTRAVENOUS

## 2019-10-03 MED ORDER — PROPOFOL 500 MG/50ML IV EMUL
INTRAVENOUS | Status: AC
  Filled 2019-10-03: qty 50

## 2019-10-03 MED ORDER — SODIUM CHLORIDE 0.9 % IV SOLN: INTRAVENOUS | Status: DC

## 2019-10-03 NOTE — Transfer of Care (Signed)
Immediate Anesthesia Transfer of Care Note  Patient: Melissa English  Procedure(s) Performed: COLONOSCOPY WITH PROPOFOL (N/A )  Patient Location: PACU  Anesthesia Type:General  Level of Consciousness: awake, alert  and drowsy  Airway & Oxygen Therapy: Patient Spontanous Breathing  Post-op Assessment: Report given to RN and Post -op Vital signs reviewed and stable  Post vital signs: Reviewed and stable  Last Vitals:  Vitals Value Taken Time  BP 92/71 10/03/19 1114  Temp 36.1 C 10/03/19 1110  Pulse 58 10/03/19 1114  Resp 20 10/03/19 1114  SpO2 100 % 10/03/19 1114    Last Pain:  Vitals:   10/03/19 1110  TempSrc: Temporal  PainSc: Asleep         Complications: No complications documented.

## 2019-10-03 NOTE — Anesthesia Procedure Notes (Signed)
Date/Time: 10/03/2019 10:11 AM Performed by: Johnna Acosta, CRNA Pre-anesthesia Checklist: Patient identified, Emergency Drugs available, Suction available, Patient being monitored and Timeout performed Patient Re-evaluated:Patient Re-evaluated prior to induction Oxygen Delivery Method: Nasal cannula Preoxygenation: Pre-oxygenation with 100% oxygen Induction Type: IV induction

## 2019-10-03 NOTE — Anesthesia Postprocedure Evaluation (Signed)
Anesthesia Post Note  Patient: Melissa English  Procedure(s) Performed: COLONOSCOPY WITH PROPOFOL (N/A )  Patient location during evaluation: PACU Anesthesia Type: General Level of consciousness: awake and alert Pain management: pain level controlled Vital Signs Assessment: post-procedure vital signs reviewed and stable Respiratory status: spontaneous breathing, nonlabored ventilation, respiratory function stable and patient connected to nasal cannula oxygen Cardiovascular status: blood pressure returned to baseline and stable Postop Assessment: no apparent nausea or vomiting Anesthetic complications: no   No complications documented.   Last Vitals:  Vitals:   10/03/19 1114 10/03/19 1140  BP: 92/71 135/74  Pulse: (!) 58   Resp: 20   Temp:    SpO2: 100%     Last Pain:  Vitals:   10/03/19 1140  TempSrc:   PainSc: 0-No pain                 Molli Barrows

## 2019-10-03 NOTE — H&P (Signed)
[name], MD [address] [place], [place], [place], [address] [address], [place], [place], [place], [address] Phone: [phone]  Fax: [phone]  Primary Care Physician:  Cletis Athens, MD   Pre-Procedure History & Physical: HPI:  Melissa English is a 69 y.o. female is here for a colonoscopy.   Past Medical History:  Diagnosis Date  . Anxiety   . Arthritis 2018   hands, back...getting injections for back  . GERD (gastroesophageal reflux disease)   . HLD (hyperlipidemia)   . HTN (hypertension)   . Hypothyroidism   . Sleep apnea    uses cpap    Past Surgical History:  Procedure Laterality Date  . BACK SURGERY  11/05/2017  . CARPAL TUNNEL RELEASE Right 2008   Dr. Derrel Nip  . CARPAL TUNNEL RELEASE Left 11/01/2016   Procedure: CARPAL TUNNEL RELEASE;  Surgeon: Earnestine Leys, MD;  Location: [facility] ORS;  Service: Orthopedics;  Laterality: Left;  . CHOLECYSTECTOMY    . LUMBAR DISC SURGERY  1998   L4 - L5.  no metal in back  . TOTAL VAGINAL HYSTERECTOMY  1998    Prior to Admission medications   Medication Sig Start Date End Date Taking? Authorizing Provider  ALPRAZolam Duanne Moron) 0.5 MG tablet Take 1 tablet (0.5 mg total) by mouth 2 (two) times daily. 09/03/19 12/02/19 Yes Masoud, Viann Shove, MD  aspirin 81 MG tablet Take 81 mg by mouth daily.     Yes [provider]  atorvastatin (LIPITOR) 20 MG tablet Take 1 tablet (20 mg total) by mouth daily. 08/05/19  Yes Cletis Athens, MD  Calcium-Magnesium-Zinc [id number] MG TABS Take by mouth daily.    Yes [provider]  levothyroxine (SYNTHROID) 112 MCG tablet Take 1 tablet (112 mcg total) by mouth daily. 08/05/19  Yes Masoud, Viann Shove, MD  lisinopril (ZESTRIL) 10 MG tablet Take 1 tablet (10 mg total) by mouth daily. 08/05/19  Yes Masoud, Viann Shove, MD  omeprazole (PRILOSEC) 20 MG capsule Take 1 capsule (20 mg total) by mouth daily. 08/05/19 11/03/19 Yes Masoud, Viann Shove, MD  polyethylene glycol-electrolytes (NULYTELY) 420 g  solution Starting at 5pm: Drink one 8 oz. Glass of Golytely mixture every 15-30 minutes until you finish the liquid jug. Stay near a toliet. 5 hours prior to appointment time: Drink half of the remaining jug of Golytely. You must consume all the prep at least 4 hours before your procedure time. Drink ONLY clear liquids up to 4 hours before your procedure, then do not eat or drink anything. You should be passing clear or clear yellow liquid from you rectum. 10/01/19  Yes Virgel Manifold, MD  acetaminophen (TYLENOL) 650 MG CR tablet 1 tab as needed for pain, 2- 3 times/days, 90 days 08/08/17   [provider]  gabapentin (NEURONTIN) 300 MG capsule gabapentin 300 mg capsule Patient not taking: Reported on 10/03/2019    [provider]    Allergies as of 09/19/2019 - Review Complete 09/19/2019  Allergen Reaction Noted  . Codeine Nausea And Vomiting and Other (See Comments) 07/20/2010  . Sulfa antibiotics Nausea And Vomiting 07/20/2010  . Other Other (See Comments) 08/30/2012  . Sulfasalazine  07/20/2010  . Prednisone Other (See Comments) 08/08/2017    History reviewed. No pertinent family history.  Social History   Socioeconomic History  . Marital status: Married    Spouse name: Not on file  . Number of children: 3  . Years of education: Not on file  . Highest education level: Not on file  Occupational History  . Occupation: housewife  Tobacco Use  . Smoking status: Never Smoker  . Smokeless tobacco: Never Used  Vaping Use  . Vaping Use: Never used  Substance and Sexual Activity  . Alcohol use: Yes    Comment: occasional wine or beer  . Drug use: No  . Sexual activity: Not on file  Other Topics Concern  . Not on file  Social History Narrative  . Not on file   Social Determinants of Health   Financial Resource Strain:   . Difficulty of Paying Living Expenses: Not on file  Food Insecurity:   . Worried About Charity fundraiser in the Last Year: Not on file   . Ran Out of Food in the Last Year: Not on file  Transportation Needs:   . Lack of Transportation (Medical): Not on file  . Lack of Transportation (Non-Medical): Not on file  Physical Activity:   . Days of Exercise per Week: Not on file  . Minutes of Exercise per Session: Not on file  Stress:   . Feeling of Stress : Not on file  Social Connections:   . Frequency of Communication with Friends and Family: Not on file  . Frequency of Social Gatherings with Friends and Family: Not on file  . Attends Religious Services: Not on file  . Active Member of Clubs or Organizations: Not on file  . Attends Archivist Meetings: Not on file  . Marital Status: Not on file  Intimate Partner Violence:   . Fear of Current or Ex-Partner: Not on file  . Emotionally Abused: Not on file  . Physically Abused: Not on file  . Sexually Abused: Not on file    Review of Systems: See HPI, otherwise negative ROS  Physical Exam: There were no vitals taken for this visit. General:   Alert,  pleasant and cooperative in NAD Head:  Normocephalic and atraumatic. Neck:  Supple; no masses or thyromegaly. Lungs:  Clear throughout to auscultation, normal respiratory effort.    Heart:  +S1, +S2, Regular rate and rhythm, No edema. Abdomen:  Soft, nontender and nondistended. Normal bowel sounds, without guarding, and without rebound.   Neurologic:  Alert and  oriented x4;  grossly normal neurologically.  Impression/Plan: Melissa English is here for a colonoscopy to be performed for average risk screening.  Risks, benefits, limitations, and alternatives regarding  colonoscopy have been reviewed with the patient.  Questions have been answered.  All parties agreeable.   Virgel Manifold, MD  10/03/2019, 9:50 AM

## 2019-10-03 NOTE — Op Note (Signed)
[facility] Patient Name: Melissa English Procedure Date: 10/03/2019 9:51 AM MRN: [id number] Account #: [id number] Date of Birth: Apr 06, 1950 Admit Type: Outpatient Age: 69 Room: [facility] ENDO ROOM 2 Gender: Female Note Status: Finalized Procedure:             Colonoscopy Indications:           Screening for colorectal malignant neoplasm Providers:             Elayjah Chaney B. Bonna Gains MD, MD Referring MD:          Cletis Athens, MD (Referring MD) Medicines:             Monitored Anesthesia Care Complications:         No immediate complications. Procedure:             Pre-Anesthesia Assessment:                        - ASA Grade Assessment: II - A patient with mild                         systemic disease.                        - Prior to the procedure, a History and Physical was                         performed, and patient medications, allergies and                         sensitivities were reviewed. The patient's tolerance                         of previous anesthesia was reviewed.                        - The risks and benefits of the procedure and the                         sedation options and risks were discussed with the                         patient. All questions were answered and informed                         consent was obtained.                        - Patient identification and proposed procedure were                         verified prior to the procedure by the physician, the                         nurse, the anesthesiologist, the anesthetist and the                         technician. The procedure was verified in the                         procedure room.  After obtaining informed consent, the colonoscope was                         passed under direct vision. Throughout the procedure,                         the patient's blood pressure, pulse, and oxygen                         saturations were monitored  continuously. The                         Colonoscope was introduced through the anus and                         advanced to the the cecum, identified by appendiceal                         orifice and ileocecal valve. The colonoscopy was                         performed with ease. The patient tolerated the                         procedure well. The quality of the bowel preparation                         was fair. About 1 L of water used to clean the colon. Findings:      The perianal and digital rectal examinations were normal.      A 4 mm polyp was found in the cecum. The polyp was sessile. The polyp       was removed with a jumbo cold forceps. Resection and retrieval were       complete.      Two flat polyps were found in the sigmoid colon. The polyps were 5 to 6       mm in size. These polyps were removed with a cold snare. Resection and       retrieval were complete.      Multiple diverticula were found in the sigmoid colon.      The exam was otherwise without abnormality.      The rectum, sigmoid colon, descending colon, transverse colon, ascending       colon and cecum appeared normal.      The retroflexed view of the distal rectum and anal verge was normal and       showed no anal or rectal abnormalities. Impression:            - One 4 mm polyp in the cecum, removed with a jumbo                         cold forceps. Resected and retrieved.                        - Two 5 to 6 mm polyps in the sigmoid colon, removed                         with a cold snare. Resected and retrieved.                        -  Diverticulosis in the sigmoid colon.                        - The examination was otherwise normal.                        - The rectum, sigmoid colon, descending colon,                         transverse colon, ascending colon and cecum are normal.                        - The distal rectum and anal verge are normal on                         retroflexion  view. Recommendation:        - Discharge patient to home (with escort).                        - Advance diet as tolerated.                        - Continue present medications.                        - Await pathology results.                        - Repeat colonoscopy in 2 years, with 2 day prep.                        - The findings and recommendations were discussed with                         the patient.                        - The findings and recommendations were discussed with                         the patient's family.                        - Return to primary care physician as previously                         scheduled.                        - High fiber diet. Procedure Code(s):     --- Professional ---                        [phone], Colonoscopy, flexible; with removal of                         tumor(s), polyp(s), or other lesion(s) by snare                         technique  79024, 81, Colonoscopy, flexible; with biopsy, single                         or multiple Diagnosis Code(s):     --- Professional ---                        K63.5, Polyp of colon                        Z12.11, Encounter for screening for malignant neoplasm                         of colon CPT copyright 2019 [employer]. All rights reserved. The codes documented in this report are preliminary and upon coder review may  be revised to meet current compliance requirements.  [name], MD Margretta Sidle B. Bonna Gains MD, MD 10/03/2019 11:09:47 AM This report has been signed electronically. Number of Addenda: 0 Note Initiated On: 10/03/2019 9:51 AM Scope Withdrawal Time: 0 hours 28 minutes 20 seconds  Total Procedure Duration: 0 hours 35 minutes 34 seconds  Estimated Blood Loss:  Estimated blood loss: none.      [facility]

## 2019-10-03 NOTE — Anesthesia Preprocedure Evaluation (Signed)
Anesthesia Evaluation  Patient identified by MRN, date of birth, ID band Patient awake    Reviewed: Allergy & Precautions, H&P , NPO status , Patient's Chart, lab work & pertinent test results, reviewed documented beta blocker date and time   History of Anesthesia Complications Negative for: history of anesthetic complications  Airway Mallampati: III  TM Distance: >3 FB Neck ROM: full    Dental  (+) Dental Advidsory Given, Teeth Intact   Pulmonary neg shortness of breath, sleep apnea and Continuous Positive Airway Pressure Ventilation , neg COPD, neg recent URI,           Cardiovascular Exercise Tolerance: Good hypertension, (-) angina(-) CAD, (-) Past MI, (-) Cardiac Stents and (-) CABG (-) dysrhythmias (-) Valvular Problems/Murmurs     Neuro/Psych PSYCHIATRIC DISORDERS Anxiety negative neurological ROS     GI/Hepatic Neg liver ROS, GERD  ,  Endo/Other  neg diabetesHypothyroidism   Renal/GU negative Renal ROS  negative genitourinary   Musculoskeletal   Abdominal   Peds  Hematology negative hematology ROS (+)   Anesthesia Other Findings Past Medical History: No date: Anxiety 2018: Arthritis     Comment:  hands, back...getting injections for back No date: GERD (gastroesophageal reflux disease) No date: HLD (hyperlipidemia) No date: HTN (hypertension) No date: Hypothyroidism No date: Sleep apnea     Comment:  uses cpap   Reproductive/Obstetrics negative OB ROS                             Anesthesia Physical  Anesthesia Plan  ASA: II  Anesthesia Plan: General   Post-op Pain Management:    Induction: Intravenous  PONV Risk Score and Plan: 3 and Propofol infusion and TIVA  Airway Management Planned: Natural Airway and Nasal Cannula  Additional Equipment:   Intra-op Plan:   Post-operative Plan:   Informed Consent: I have reviewed the patients History and Physical, chart,  labs and discussed the procedure including the risks, benefits and alternatives for the proposed anesthesia with the patient or authorized representative who has indicated his/her understanding and acceptance.     Dental Advisory Given  Plan Discussed with: Anesthesiologist, CRNA and Surgeon  Anesthesia Plan Comments:         Anesthesia Quick Evaluation

## 2019-10-06 ENCOUNTER — Encounter: Payer: Self-pay | Admitting: Gastroenterology

## 2019-10-06 LAB — SURGICAL PATHOLOGY

## 2019-10-07 ENCOUNTER — Ambulatory Visit
Admission: RE | Admit: 2019-10-07 | Discharge: 2019-10-07 | Disposition: A | Discharge status: Home / Self Care | Payer: Medicare Other | Source: Ambulatory Visit | Attending: Internal Medicine | Admitting: Internal Medicine

## 2019-10-07 ENCOUNTER — Other Ambulatory Visit: Payer: Self-pay

## 2019-10-07 DIAGNOSIS — Z1231 Encounter for screening mammogram for malignant neoplasm of breast: Secondary | ICD-10-CM | POA: Insufficient documentation

## 2019-10-07 DIAGNOSIS — Z0000 Encounter for general adult medical examination without abnormal findings: Secondary | ICD-10-CM

## 2019-10-13 ENCOUNTER — Other Ambulatory Visit: Payer: Self-pay | Admitting: *Deleted

## 2019-10-13 ENCOUNTER — Inpatient Hospital Stay
Admission: RE | Admit: 2019-10-13 | Discharge: 2019-10-13 | Disposition: A | Discharge status: Home / Self Care | Payer: Self-pay | Source: Ambulatory Visit | Attending: *Deleted | Admitting: *Deleted

## 2019-10-13 DIAGNOSIS — Z1231 Encounter for screening mammogram for malignant neoplasm of breast: Secondary | ICD-10-CM

## 2019-10-20 ENCOUNTER — Other Ambulatory Visit: Payer: Self-pay | Admitting: Internal Medicine

## 2019-10-20 DIAGNOSIS — R928 Other abnormal and inconclusive findings on diagnostic imaging of breast: Secondary | ICD-10-CM

## 2019-10-30 ENCOUNTER — Other Ambulatory Visit: Payer: Self-pay

## 2019-10-30 ENCOUNTER — Ambulatory Visit
Admission: RE | Admit: 2019-10-30 | Discharge: 2019-10-30 | Disposition: A | Discharge status: Home / Self Care | Payer: Medicare Other | Source: Ambulatory Visit | Attending: Internal Medicine | Admitting: Internal Medicine

## 2019-10-30 DIAGNOSIS — R928 Other abnormal and inconclusive findings on diagnostic imaging of breast: Secondary | ICD-10-CM | POA: Insufficient documentation

## 2019-11-12 ENCOUNTER — Other Ambulatory Visit: Payer: Self-pay | Admitting: Internal Medicine

## 2019-11-30 DIAGNOSIS — R3915 Urgency of urination: Secondary | ICD-10-CM | POA: Diagnosis not present

## 2019-11-30 DIAGNOSIS — N3000 Acute cystitis without hematuria: Secondary | ICD-10-CM | POA: Diagnosis not present

## 2019-11-30 DIAGNOSIS — R109 Unspecified abdominal pain: Secondary | ICD-10-CM | POA: Diagnosis not present

## 2019-12-09 ENCOUNTER — Other Ambulatory Visit: Payer: Self-pay | Admitting: *Deleted

## 2019-12-09 MED ORDER — FLUOXETINE HCL 20 MG PO CAPS: 20.0000 mg | ORAL_CAPSULE | Freq: Every day | ORAL | 3 refills | Status: DC

## 2019-12-16 DIAGNOSIS — G4733 Obstructive sleep apnea (adult) (pediatric): Secondary | ICD-10-CM | POA: Diagnosis not present

## 2019-12-25 DIAGNOSIS — H9313 Tinnitus, bilateral: Secondary | ICD-10-CM | POA: Diagnosis not present

## 2019-12-25 DIAGNOSIS — H903 Sensorineural hearing loss, bilateral: Secondary | ICD-10-CM | POA: Diagnosis not present

## 2020-01-19 DIAGNOSIS — H905 Unspecified sensorineural hearing loss: Secondary | ICD-10-CM | POA: Diagnosis not present

## 2020-03-02 ENCOUNTER — Other Ambulatory Visit: Payer: Self-pay

## 2020-03-02 ENCOUNTER — Ambulatory Visit (INDEPENDENT_AMBULATORY_CARE_PROVIDER_SITE_OTHER): Payer: Medicare Other | Admitting: Internal Medicine

## 2020-03-02 ENCOUNTER — Encounter: Payer: Self-pay | Admitting: Internal Medicine

## 2020-03-02 VITALS — BP 131/76 | HR 69 | Ht 66.0 in | Wt 207.6 lb

## 2020-03-02 DIAGNOSIS — E063 Autoimmune thyroiditis: Secondary | ICD-10-CM

## 2020-03-02 DIAGNOSIS — E6609 Other obesity due to excess calories: Secondary | ICD-10-CM | POA: Diagnosis not present

## 2020-03-02 DIAGNOSIS — F419 Anxiety disorder, unspecified: Secondary | ICD-10-CM

## 2020-03-02 DIAGNOSIS — M5136 Other intervertebral disc degeneration, lumbar region: Secondary | ICD-10-CM | POA: Diagnosis not present

## 2020-03-02 DIAGNOSIS — K219 Gastro-esophageal reflux disease without esophagitis: Secondary | ICD-10-CM | POA: Diagnosis not present

## 2020-03-02 DIAGNOSIS — E038 Other specified hypothyroidism: Secondary | ICD-10-CM

## 2020-03-02 DIAGNOSIS — Z6832 Body mass index (BMI) 32.0-32.9, adult: Secondary | ICD-10-CM

## 2020-03-02 NOTE — Progress Notes (Signed)
Established Patient Office Visit  Subjective:  Patient ID: Melissa English, female    DOB: [birth year]  Age: 70 y.o. MRN: [id number]  CC:  Chief Complaint  Patient presents with  . Hypertension    Patient is here for her 6 month bp follow up.    HPI  Melissa English presents forcheck up, patient has a history of reflux problem hypothyroidism due to Hashimoto's disease thyroiditis.  Also has a lumbar pain which is stable at the present time  Past Medical History:  Diagnosis Date  . Anxiety   . Arthritis 2018   hands, back...getting injections for back  . GERD (gastroesophageal reflux disease)   . HLD (hyperlipidemia)   . HTN (hypertension)   . Hypothyroidism   . Sleep apnea    uses cpap    Past Surgical History:  Procedure Laterality Date  . ABDOMINAL HYSTERECTOMY    . BACK SURGERY  11/05/2017  . CARPAL TUNNEL RELEASE Right 2008   Dr. Derrel Nip  . CARPAL TUNNEL RELEASE Left 11/01/2016   Procedure: CARPAL TUNNEL RELEASE;  Surgeon: Earnestine Leys, MD;  Location: [facility];  Service: Orthopedics;  Laterality: Left;  . CHOLECYSTECTOMY    . COLONOSCOPY WITH PROPOFOL N/A 10/03/2019   Procedure: COLONOSCOPY WITH PROPOFOL;  Surgeon: Virgel Manifold, MD;  Location: [facility];  Service: Endoscopy;  Laterality: N/A;  . LUMBAR DISC SURGERY  1998   L4 - L5.  no metal in back  . TOTAL VAGINAL HYSTERECTOMY  1998    Family History  Problem Relation Age of Onset  . Breast cancer Neg Hx     Social History   Socioeconomic History  . Marital status: Married    Spouse name: Not on file  . Number of children: 3  . Years of education: Not on file  . Highest education level: Not on file  Occupational History  . Occupation: housewife  Tobacco Use  . Smoking status: Never Smoker  . Smokeless tobacco: Never Used  Vaping Use  . Vaping Use: Never used  Substance and Sexual Activity  . Alcohol use: Yes    Comment: occasional wine or beer  . Drug use: No  . Sexual activity: Not  on file  Other Topics Concern  . Not on file  Social History Narrative  . Not on file   Social Determinants of Health   Financial Resource Strain: Not on file  Food Insecurity: Not on file  Transportation Needs: Not on file  Physical Activity: Not on file  Stress: Not on file  Social Connections: Not on file  Intimate Partner Violence: Not on file     Current Outpatient Medications:  .  acetaminophen (TYLENOL) 650 MG CR tablet, 1 tab as needed for pain, 2- 3 times/days, 90 days, Disp: , Rfl:  .  aspirin 81 MG tablet, Take 81 mg by mouth daily.  , Disp: , Rfl:  .  atorvastatin (LIPITOR) 20 MG tablet, Take 1 tablet (20 mg total) by mouth daily., Disp: 90 tablet, Rfl: 3 .  Calcium-Magnesium-Zinc 333-133-5 MG TABS, Take by mouth daily. , Disp: , Rfl:  .  FLUoxetine (PROZAC) 20 MG capsule, Take 1 capsule (20 mg total) by mouth daily., Disp: 90 capsule, Rfl: 3 .  gabapentin (NEURONTIN) 300 MG capsule, gabapentin 300 mg capsule (Patient not taking: Reported on 10/03/2019), Disp: , Rfl:  .  levothyroxine (SYNTHROID) 112 MCG tablet, Take 1 tablet (112 mcg total) by mouth daily., Disp: 90 tablet, Rfl: 3 .  lisinopril (ZESTRIL) 10 MG tablet, Take 1 tablet (10 mg total) by mouth daily., Disp: 90 tablet, Rfl: 3 .  omeprazole (PRILOSEC) 20 MG capsule, Take 1 capsule (20 mg total) by mouth daily., Disp: 90 capsule, Rfl: 2 .  polyethylene glycol-electrolytes (NULYTELY) 420 g solution, Starting at 5pm: Drink one 8 oz. Glass of Golytely mixture every 15-30 minutes until you finish the liquid jug. Stay near a toliet. 5 hours prior to appointment time: Drink half of the remaining jug of Golytely. You must consume all the prep at least 4 hours before your procedure time. Drink ONLY clear liquids up to 4 hours before your procedure, then do not eat or drink anything. You should be passing clear or clear yellow liquid from you rectum., Disp: 4000 mL, Rfl: 0   Allergies  Allergen Reactions  . Codeine Nausea  And Vomiting and Other (See Comments)    Severe n & v Severe n & v  . Sulfa Antibiotics Nausea And Vomiting    Occurred a long time ago so patient is unsure of reaction  . Other Other (See Comments)  . Sulfasalazine   . Prednisone Other (See Comments)    Could not sleep and felt nervous.    ROS Review of Systems  Constitutional: Negative.   HENT: Negative.   Eyes: Negative.   Respiratory: Negative.   Cardiovascular: Negative.   Gastrointestinal: Negative.   Endocrine: Negative.   Genitourinary: Negative.   Musculoskeletal: Negative.   Skin: Negative.   Allergic/Immunologic: Negative.   Neurological: Negative.   Hematological: Negative.   Psychiatric/Behavioral: Negative.   All other systems reviewed and are negative.     Objective:    Physical Exam Vitals reviewed.  Constitutional:      Appearance: Normal appearance.  HENT:     Mouth/Throat:     Mouth: Mucous membranes are moist.  Eyes:     Pupils: Pupils are equal, round, and reactive to light.  Neck:     Vascular: No carotid bruit.  Cardiovascular:     Rate and Rhythm: Normal rate and regular rhythm.     Pulses: Normal pulses.     Heart sounds: Normal heart sounds.  Pulmonary:     Effort: Pulmonary effort is normal.     Breath sounds: Normal breath sounds.  Abdominal:     General: Bowel sounds are normal.     Palpations: Abdomen is soft. There is no hepatomegaly, splenomegaly or mass.     Tenderness: There is no abdominal tenderness.     Hernia: No hernia is present.  Musculoskeletal:        General: No tenderness.     Cervical back: Neck supple.     Right lower leg: No edema.     Left lower leg: No edema.  Skin:    Findings: No rash.  Neurological:     Mental Status: She is alert and oriented to person, place, and time.     Motor: No weakness.  Psychiatric:        Mood and Affect: Mood and affect normal.        Behavior: Behavior normal.     BP 131/76   Pulse 69   Ht 5\' 6"  (1.676 m)   Wt  207 lb 9.6 oz (94.2 kg)   BMI 33.51 kg/m  Wt Readings from Last 3 Encounters:  03/02/20 207 lb 9.6 oz (94.2 kg)  10/03/19 193 lb (87.5 kg)  09/03/19 203 lb 12.8 oz (92.4 kg)  Health Maintenance Due  Topic Date Due  . Hepatitis C Screening  Never done  . COVID-19 Vaccine (1) Never done  . TETANUS/TDAP  Never done  . DEXA SCAN  Never done  . PNA vac Low Risk Adult (1 of 2 - PCV13) Never done  . INFLUENZA VACCINE  08/10/2019    There are no preventive care reminders to display for this patient.  Lab Results  Component Value Date   TSH 0.13 (L) 08/05/2019   Lab Results  Component Value Date   WBC 6.8 08/05/2019   HGB 13.3 08/05/2019   HCT 40.6 08/05/2019   MCV 95.8 08/05/2019   PLT 172 08/05/2019   Lab Results  Component Value Date   NA 140 08/05/2019   K 4.6 08/05/2019   CO2 28 08/05/2019   GLUCOSE 117 (H) 08/05/2019   BUN 16 08/05/2019   CREATININE 0.82 08/05/2019   BILITOT 1.0 08/05/2019   ALKPHOS 99 06/16/2011   AST 19 08/05/2019   ALT 18 08/05/2019   PROT 6.9 08/05/2019   ALBUMIN 3.9 06/16/2011   CALCIUM 9.6 08/05/2019   ANIONGAP 3 (L) 09/16/2012   Lab Results  Component Value Date   CHOL 185 08/05/2019   Lab Results  Component Value Date   HDL 62 08/05/2019   Lab Results  Component Value Date   LDLCALC 101 (H) 08/05/2019   Lab Results  Component Value Date   TRIG 121 08/05/2019   Lab Results  Component Value Date   CHOLHDL 3.0 08/05/2019   No results found for: HGBA1C    Assessment & Plan:   Problem List Items Addressed This Visit      Digestive   Gastroesophageal reflux disease without esophagitis    - The patient's GERD is stable on medication.  - Instructed the patient to avoid eating spicy and acidic foods, as well as foods high in fat. - Instructed the patient to avoid eating large meals or meals 2-3 hours prior to sleeping.         Endocrine   Hypothyroidism due to Hashimoto's thyroiditis    Patient is taking her  Levoxyl regularly.  She was advised to have an annual TSH done.        Musculoskeletal and Integument   DDD (degenerative disc disease), lumbar    - Patient's back pain is under control with medication.  - Encouraged the patient to stretch or do yoga as able to help with back pain         Other   Class 1 obesity due to excess calories without serious comorbidity with body mass index (BMI) of 32.0 to 32.9 in adult    - I encouraged the patient to lose weight.  - I educated them on making healthy dietary choices including eating more fruits and vegetables and less fried foods. - I encouraged the patient to exercise more, and educated on the benefits of exercise including weight loss, diabetes prevention, and hypertension prevention.        Anxiety - Primary    - Patient experiencing high levels of anxiety.  - Encouraged patient to engage in relaxing activities like yoga, meditation, journaling, going for a walk, or participating in a hobby.  - Encouraged patient to reach out to trusted friends or family members about recent struggles              Mediterranean Diet  Why follow it? Research shows. . Those who follow the Mediterranean diet have a reduced risk of  heart disease  . The diet is associated with a reduced incidence of Parkinson's and Alzheimer's diseases . People following the diet may have longer life expectancies and lower rates of chronic diseases  . The Dietary Guidelines for Americans recommends the Mediterranean diet as an eating plan to promote health and prevent disease  What Is the Mediterranean Diet?  . Healthy eating plan based on typical foods and recipes of Mediterranean-style cooking . The diet is primarily a plant based diet; these foods should make up a majority of meals   Starches - Plant based foods should make up a majority of meals - They are an important sources of vitamins, minerals, energy, antioxidants, and fiber - Choose whole grains, foods  high in fiber and minimally processed items  - Typical grain sources include wheat, oats, barley, corn, brown rice, bulgar, farro, millet, polenta, couscous  - Various types of beans include chickpeas, lentils, fava beans, black beans, white beans   Fruits  Veggies - Large quantities of antioxidant rich fruits & veggies; 6 or more servings  - Vegetables can be eaten raw or lightly drizzled with oil and cooked  - Vegetables common to the traditional Mediterranean Diet include: artichokes, arugula, beets, broccoli, brussel sprouts, cabbage, carrots, celery, collard greens, cucumbers, eggplant, kale, leeks, lemons, lettuce, mushrooms, okra, onions, peas, peppers, potatoes, pumpkin, radishes, rutabaga, shallots, spinach, sweet potatoes, turnips, zucchini - Fruits common to the Mediterranean Diet include: apples, apricots, avocados, cherries, clementines, dates, figs, grapefruits, grapes, melons, nectarines, oranges, peaches, pears, pomegranates, strawberries, tangerines  Fats - Replace butter and margarine with healthy oils, such as olive oil, canola oil, and tahini  - Limit nuts to no more than a handful a day  - Nuts include walnuts, almonds, pecans, pistachios, pine nuts  - Limit or avoid candied, honey roasted or heavily salted nuts - Olives are central to the [employer] - can be eaten whole or used in a variety of dishes   Meats Protein - Limiting red meat: no more than a few times a month - When eating red meat: choose lean cuts and keep the portion to the size of deck of cards - Eggs: approx. 0 to 4 times a week  - Fish and lean poultry: at least 2 a week  - Healthy protein sources include, chicken, [place], lean beef, lamb - Increase intake of seafood such as tuna, salmon, trout, mackerel, shrimp, scallops - Avoid or limit high fat processed meats such as sausage and bacon  Dairy - Include moderate amounts of low fat dairy products  - Focus on healthy dairy such as fat free yogurt,  skim milk, low or reduced fat cheese - Limit dairy products higher in fat such as whole or 2% milk, cheese, ice cream  Alcohol - Moderate amounts of red wine is ok  - No more than 5 oz daily for women (all ages) and men older than age 16  - No more than 10 oz of wine daily for men younger than 72  Other - Limit sweets and other desserts  - Use herbs and spices instead of salt to flavor foods  - Herbs and spices common to the traditional Mediterranean Diet include: basil, bay leaves, chives, cloves, cumin, fennel, garlic, lavender, marjoram, mint, oregano, parsley, pepper, rosemary, sage, savory, sumac, tarragon, thyme   It's not just a diet, it's a lifestyle:  . The Mediterranean diet includes lifestyle factors typical of those in the region  . Foods, drinks and meals are best  eaten with others and savored . Daily physical activity is important for overall good health . This could be strenuous exercise like running and aerobics . This could also be more leisurely activities such as walking, housework, yard-work, or taking the stairs . Moderation is the key; a balanced and healthy diet accommodates most foods and drinks . Consider portion sizes and frequency of consumption of certain foods   Meal Ideas & Options:  . Breakfast:  o Whole wheat toast or whole wheat English muffins with peanut butter & hard boiled egg o Steel cut oats topped with apples & cinnamon and skim milk  o Fresh fruit: banana, strawberries, melon, berries, peaches  o Smoothies: strawberries, bananas, greek yogurt, peanut butter o Low fat greek yogurt with blueberries and granola  o Egg white omelet with spinach and mushrooms o Breakfast couscous: whole wheat couscous, apricots, skim milk, cranberries  . Sandwiches:  o Hummus and grilled vegetables (peppers, zucchini, squash) on whole wheat bread   o Grilled chicken on whole wheat pita with lettuce, tomatoes, cucumbers or tzatziki  o Tuna salad on whole wheat bread:  tuna salad made with greek yogurt, olives, red peppers, capers, green onions o Garlic rosemary lamb pita: lamb sauted with garlic, rosemary, salt & pepper; add lettuce, cucumber, greek yogurt to pita - flavor with lemon juice and black pepper  . Seafood:  o Mediterranean grilled salmon, seasoned with garlic, basil, parsley, lemon juice and black pepper o Shrimp, lemon, and spinach whole-grain pasta salad made with low fat greek yogurt  o Seared scallops with lemon orzo  o Seared tuna steaks seasoned salt, pepper, coriander topped with tomato mixture of olives, tomatoes, olive oil, minced garlic, parsley, green onions and cappers  . Meats:  o Herbed greek chicken salad with kalamata olives, cucumber, feta  o Red bell peppers stuffed with spinach, bulgur, lean ground beef (or lentils) & topped with feta   o Kebabs: skewers of chicken, tomatoes, onions, zucchini, squash  o [place] burgers: made with red onions, mint, dill, lemon juice, feta cheese topped with roasted red peppers . Vegetarian o Cucumber salad: cucumbers, artichoke hearts, celery, red onion, feta cheese, tossed in olive oil & lemon juice  o Hummus and whole grain pita points with a greek salad (lettuce, tomato, feta, olives, cucumbers, red onion) o Lentil soup with celery, carrots made with vegetable broth, garlic, salt and pepper  o Tabouli salad: parsley, bulgur, mint, scallions, cucumbers, tomato, radishes, lemon juice, olive oil, salt and pepper.  No orders of the defined types were placed in this encounter.   Follow-up: No follow-ups on file.    Cletis Athens, MD

## 2020-03-03 ENCOUNTER — Ambulatory Visit: Payer: Medicare Other | Admitting: Internal Medicine

## 2020-03-04 ENCOUNTER — Encounter: Payer: Self-pay | Admitting: Internal Medicine

## 2020-03-04 NOTE — Assessment & Plan Note (Signed)
-   I encouraged the patient to lose weight.  - I educated them on making healthy dietary choices including eating more fruits and vegetables and less fried foods. - I encouraged the patient to exercise more, and educated on the benefits of exercise including weight loss, diabetes prevention, and hypertension prevention.   

## 2020-03-04 NOTE — Assessment & Plan Note (Signed)
-   Patient experiencing high levels of anxiety.  - Encouraged patient to engage in relaxing activities like yoga, meditation, journaling, going for a walk, or participating in a hobby.  - Encouraged patient to reach out to trusted friends or family members about recent struggles 

## 2020-03-04 NOTE — Assessment & Plan Note (Signed)
-   Patient's back pain is under control with medication.  - Encouraged the patient to stretch or do yoga as able to help with back pain 

## 2020-03-04 NOTE — Assessment & Plan Note (Signed)
-   The patient's GERD is stable on medication.  - Instructed the patient to avoid eating spicy and acidic foods, as well as foods high in fat. - Instructed the patient to avoid eating large meals or meals 2-3 hours prior to sleeping. 

## 2020-03-04 NOTE — Assessment & Plan Note (Signed)
Patient is taking her Levoxyl regularly.  She was advised to have an annual TSH done.

## 2020-03-17 ENCOUNTER — Ambulatory Visit (INDEPENDENT_AMBULATORY_CARE_PROVIDER_SITE_OTHER): Payer: Medicare Other | Admitting: Internal Medicine

## 2020-03-17 DIAGNOSIS — E038 Other specified hypothyroidism: Secondary | ICD-10-CM

## 2020-03-17 DIAGNOSIS — E063 Autoimmune thyroiditis: Secondary | ICD-10-CM | POA: Diagnosis not present

## 2020-03-17 NOTE — Addendum Note (Signed)
Addended by: Lacretia Nicks L on: 03/17/2020 11:12 AM   Modules accepted: Orders

## 2020-03-18 ENCOUNTER — Other Ambulatory Visit: Payer: Self-pay | Admitting: *Deleted

## 2020-03-18 LAB — TSH: TSH: 0.03 mIU/L — ABNORMAL LOW (ref 0.40–4.50)

## 2020-03-18 MED ORDER — LEVOTHYROXINE SODIUM 150 MCG PO TABS: 150.0000 ug | ORAL_TABLET | Freq: Every day | ORAL | 3 refills | Status: DC

## 2020-03-21 ENCOUNTER — Other Ambulatory Visit: Payer: Self-pay | Admitting: Internal Medicine

## 2020-04-06 ENCOUNTER — Other Ambulatory Visit: Payer: Self-pay | Admitting: *Deleted

## 2020-04-06 MED ORDER — FLUOXETINE HCL 20 MG PO CAPS: 20.0000 mg | ORAL_CAPSULE | Freq: Every day | ORAL | 3 refills | Status: DC

## 2020-04-21 DIAGNOSIS — K5730 Diverticulosis of large intestine without perforation or abscess without bleeding: Secondary | ICD-10-CM | POA: Diagnosis not present

## 2020-04-21 DIAGNOSIS — Z7982 Long term (current) use of aspirin: Secondary | ICD-10-CM | POA: Diagnosis not present

## 2020-04-21 DIAGNOSIS — Z882 Allergy status to sulfonamides status: Secondary | ICD-10-CM | POA: Diagnosis not present

## 2020-04-21 DIAGNOSIS — Z79899 Other long term (current) drug therapy: Secondary | ICD-10-CM | POA: Diagnosis not present

## 2020-04-21 DIAGNOSIS — E039 Hypothyroidism, unspecified: Secondary | ICD-10-CM | POA: Diagnosis not present

## 2020-04-21 DIAGNOSIS — Z885 Allergy status to narcotic agent status: Secondary | ICD-10-CM | POA: Diagnosis not present

## 2020-04-21 DIAGNOSIS — I440 Atrioventricular block, first degree: Secondary | ICD-10-CM | POA: Diagnosis not present

## 2020-04-21 DIAGNOSIS — K5792 Diverticulitis of intestine, part unspecified, without perforation or abscess without bleeding: Secondary | ICD-10-CM | POA: Diagnosis not present

## 2020-04-21 DIAGNOSIS — R109 Unspecified abdominal pain: Secondary | ICD-10-CM | POA: Diagnosis not present

## 2020-04-21 DIAGNOSIS — R1084 Generalized abdominal pain: Secondary | ICD-10-CM | POA: Diagnosis not present

## 2020-04-21 DIAGNOSIS — R1013 Epigastric pain: Secondary | ICD-10-CM | POA: Diagnosis not present

## 2020-04-21 DIAGNOSIS — R1011 Right upper quadrant pain: Secondary | ICD-10-CM | POA: Diagnosis not present

## 2020-04-21 DIAGNOSIS — Z9049 Acquired absence of other specified parts of digestive tract: Secondary | ICD-10-CM | POA: Diagnosis not present

## 2020-05-03 ENCOUNTER — Other Ambulatory Visit: Payer: Self-pay

## 2020-05-03 ENCOUNTER — Encounter: Payer: Self-pay | Admitting: Gastroenterology

## 2020-05-03 ENCOUNTER — Ambulatory Visit: Payer: Medicare Other | Admitting: Gastroenterology

## 2020-05-03 VITALS — BP 120/68 | HR 67 | Temp 97.5°F | Ht 67.0 in | Wt 203.6 lb

## 2020-05-03 DIAGNOSIS — K219 Gastro-esophageal reflux disease without esophagitis: Secondary | ICD-10-CM

## 2020-05-03 DIAGNOSIS — R1084 Generalized abdominal pain: Secondary | ICD-10-CM

## 2020-05-03 MED ORDER — OMEPRAZOLE 40 MG PO CPDR: 40.0000 mg | DELAYED_RELEASE_CAPSULE | Freq: Two times a day (BID) | ORAL | 0 refills | Status: DC

## 2020-05-03 NOTE — Progress Notes (Signed)
Melissa English  Melissa English, Melissa English [id number]  Main: [phone]  Fax: [phone]   Gastroenterology Consultation  Referring Provider:     Cletis Athens, MD Primary Care Physician:  Cletis Athens, MD Reason for Consultation:     Abdominal pain        HPI:    Chief Complaint  Patient presents with  . Abdominal Pain    Melissa English is a 70 y.o. y/o female referred for consultation & management  by Dr. Cletis Athens, MD.  Patient reports bilateral upper quadrant abdominal pain ongoing for 10 days.  She went to the [facility] ER and a CT scan showed diverticulosis, with wall thickening of the sigmoid colon without significant fat stranding, which was noted to be nonspecific but it stated that this could represent uncomplicated diverticulitis.  She was discharged with antibiotics for diverticulitis.  Patient states her abdominal pain did not get better during or after completion of her antibiotic course.  No vomiting but has had some nausea.  No fever or chills.  No prior history of similar symptoms.  Nonradiating, dull, 5/10.  Takes aspirin daily, but denies any other NSAIDs.  Denies any melena.  Has been on omeprazole 20 mg once daily for over a decade for heartburn.  No prior upper endoscopy.  Has previously had cholecystectomy.  Liver enzymes were normal and CT scan did not report any CBD abnormalities.  Past Medical History:  Diagnosis Date  . Anxiety   . Arthritis 2018   hands, back...getting injections for back  . GERD (gastroesophageal reflux disease)   . HLD (hyperlipidemia)   . HTN (hypertension)   . Hypothyroidism   . Sleep apnea    uses cpap    Past Surgical History:  Procedure Laterality Date  . ABDOMINAL HYSTERECTOMY    . BACK SURGERY  11/05/2017  . CARPAL TUNNEL RELEASE Right 2008   Dr. Derrel Nip  . CARPAL TUNNEL RELEASE Left 11/01/2016   Procedure: CARPAL TUNNEL RELEASE;  Surgeon: Earnestine Leys, MD;  Location: [facility] ORS;  Service:  Orthopedics;  Laterality: Left;  . CHOLECYSTECTOMY    . COLONOSCOPY WITH PROPOFOL N/A 10/03/2019   Procedure: COLONOSCOPY WITH PROPOFOL;  Surgeon: Virgel Manifold, MD;  Location: [facility] ENDOSCOPY;  Service: Endoscopy;  Laterality: N/A;  . LUMBAR DISC SURGERY  1998   L4 - L5.  no metal in back  . TOTAL VAGINAL HYSTERECTOMY  1998    Prior to Admission medications   Medication Sig Start Date End Date Taking? Authorizing Provider  acetaminophen (TYLENOL) 650 MG CR tablet 1 tab as needed for pain, 2- 3 times/days, 90 days 08/08/17  Yes [provider]  aspirin 81 MG tablet Take 81 mg by mouth daily.   Yes [provider]  atorvastatin (LIPITOR) 20 MG tablet Take 1 tablet (20 mg total) by mouth daily. 08/05/19  Yes Cletis Athens, MD  Calcium-Magnesium-Zinc [phone] MG TABS Take by mouth daily.    Yes [provider]  FLUoxetine (PROZAC) 20 MG capsule Take 1 capsule (20 mg total) by mouth daily. 04/06/20  Yes Masoud, Viann Shove, MD  gabapentin (NEURONTIN) 300 MG capsule gabapentin 300 mg capsule   Yes [provider]  levothyroxine (SYNTHROID) 150 MCG tablet Take 1 tablet (150 mcg total) by mouth daily. 03/18/20  Yes Beckie Salts, FNP  lisinopril (ZESTRIL) 10 MG tablet Take 1 tablet (10 mg total) by mouth daily. 08/05/19  Yes Cletis Athens, MD  omeprazole (PRILOSEC) 40 MG  capsule Take 1 capsule (40 mg total) by mouth in the morning and at bedtime for 28 days. 05/03/20 05/31/20 Yes Melissa Antigua B, MD  polyethylene glycol-electrolytes (NULYTELY) 420 g solution Starting at 5pm: Drink one 8 oz. Glass of Golytely mixture every 15-30 minutes until you finish the liquid jug. Stay near a toliet. 5 hours prior to appointment time: Drink half of the remaining jug of Golytely. You must consume all the prep at least 4 hours before your procedure time. Drink ONLY clear liquids up to 4 hours before your procedure, then do not eat or drink anything. You should be passing clear or  clear yellow liquid from you rectum. Patient not taking: Reported on 05/03/2020 10/01/19   Virgel Manifold, MD    Family History  Problem Relation Age of Onset  . Breast cancer Neg Hx      Social History   Tobacco Use  . Smoking status: Never Smoker  . Smokeless tobacco: Never Used  Vaping Use  . Vaping Use: Never used  Substance Use Topics  . Alcohol use: Yes    Comment: occasional wine or beer  . Drug use: No    Allergies as of 05/03/2020 - Review Complete 05/03/2020  Allergen Reaction Noted  . Codeine Nausea And Vomiting and Other (See Comments) 07/20/2010  . Sulfa antibiotics Nausea And Vomiting 07/20/2010  . Other Other (See Comments) 08/30/2012  . Sulfasalazine  07/20/2010  . Prednisone Other (See Comments) 08/08/2017    Review of Systems:    All systems reviewed and negative except where noted in HPI.   Physical Exam:  BP 120/68   Pulse 67   Temp (!) 97.5 F (36.4 C) (Oral)   Ht 5\' 7"  (1.702 m)   Wt 203 lb 9.6 oz (92.4 kg)   BMI 31.89 kg/m  No LMP recorded. Patient has had a hysterectomy. Psych:  Alert and cooperative. Normal mood and affect. General:   Alert,  Well-developed, well-nourished, pleasant and cooperative in NAD Head:  Normocephalic and atraumatic. Eyes:  Sclera clear, no icterus.   Conjunctiva pink. Ears:  Normal auditory acuity. Nose:  No deformity, discharge, or lesions. Mouth:  No deformity or lesions,oropharynx pink & moist. Neck:  Supple; no masses or thyromegaly. Abdomen:  Normal bowel sounds.  No bruits.  Soft, mildly tender to palpation bilateral upper quadrant, and non-distended without masses, hepatosplenomegaly or hernias noted.  No guarding or rebound tenderness.    Msk:  Symmetrical without gross deformities. Good, equal movement & strength bilaterally. Pulses:  Normal pulses noted. Extremities:  No clubbing or edema.  No cyanosis. Neurologic:  Alert and oriented x3;  grossly normal neurologically. Skin:  Intact without  significant lesions or rashes. No jaundice. Lymph Nodes:  No significant cervical adenopathy. Psych:  Alert and cooperative. Normal mood and affect.   Labs: CBC    Component Value Date/Time   WBC 6.8 08/05/2019 1027   RBC 4.24 08/05/2019 1027   HGB 13.3 08/05/2019 1027   HGB 13.4 09/16/2012 1304   HCT 40.6 08/05/2019 1027   HCT 38.8 09/16/2012 1304   PLT 172 08/05/2019 1027   PLT 156 09/16/2012 1304   MCV 95.8 08/05/2019 1027   MCV 94 09/16/2012 1304   MCH 31.4 08/05/2019 1027   MCHC 32.8 08/05/2019 1027   RDW 12.6 08/05/2019 1027   RDW 13.4 09/16/2012 1304   LYMPHSABS 1,550 08/05/2019 1027   LYMPHSABS 1.5 09/16/2012 1304   MONOABS 0.5 09/16/2012 1304   EOSABS 177 08/05/2019 1027  EOSABS 0.2 09/16/2012 1304   BASOSABS 61 08/05/2019 1027   BASOSABS 0.1 09/16/2012 1304   CMP     Component Value Date/Time   NA 140 08/05/2019 1027   NA 140 09/16/2012 1304   K 4.6 08/05/2019 1027   K 4.0 09/16/2012 1304   CL 103 08/05/2019 1027   CL 107 09/16/2012 1304   CO2 28 08/05/2019 1027   CO2 30 09/16/2012 1304   GLUCOSE 117 (H) 08/05/2019 1027   GLUCOSE 108 (H) 09/16/2012 1304   BUN 16 08/05/2019 1027   BUN 7 09/16/2012 1304   CREATININE 0.82 08/05/2019 1027   CALCIUM 9.6 08/05/2019 1027   CALCIUM 9.2 09/16/2012 1304   PROT 6.9 08/05/2019 1027   PROT 7.3 06/16/2011 1349   ALBUMIN 3.9 06/16/2011 1349   AST 19 08/05/2019 1027   AST 30 06/16/2011 1349   ALT 18 08/05/2019 1027   ALT 41 06/16/2011 1349   ALKPHOS 99 06/16/2011 1349   BILITOT 1.0 08/05/2019 1027   BILITOT 0.6 06/16/2011 1349   GFRNONAA 72 08/05/2019 1027   GFRAA 84 08/05/2019 1027    Imaging Studies: No results found.  Assessment and Plan:   Melissa English is a 70 y.o. y/o female has been referred for abdominal pain  Location of the pain and location of the possible sigmoid diverticulitis does not match  I do not think her pain is from diverticulitis  I will obtain H pylori serology, increase her  PPI and check liver enzymes  She may have small aspirin induced ulcers or gastritis.  Or her symptoms may be due to GERD and PPI should help with follow-up.  No indication for urgent endoscopy at this time  If liver enzymes are elevated, consider RUQ ultrasound  (Risks of PPI use were discussed with patient including bone loss, C. Diff diarrhea, pneumonia, infections, CKD, electrolyte abnormalities.  Pt. Verbalizes understanding and chooses to continue the medication.)  If pain acutely worsens pt advised to call us back immediately and she verbalized understanding  We also discussed EGD if pain does not improve and elective EGD in the future for barretts screening as well  F/u in 4 weeks to reassess symptoms  Dr Melissa Antigua  Speech recognition software was used to dictate the above note.

## 2020-05-06 LAB — HEPATIC FUNCTION PANEL
ALT: 15 IU/L (ref 0–32)
AST: 18 IU/L (ref 0–40)
Albumin: 4.5 g/dL (ref 3.7–4.7)
Alkaline Phosphatase: 142 IU/L — ABNORMAL HIGH (ref 44–121)
Bilirubin Total: 0.4 mg/dL (ref 0.0–1.2)
Bilirubin, Direct: 0.13 mg/dL (ref 0.00–0.40)
Total Protein: 7.3 g/dL (ref 6.0–8.5)

## 2020-05-06 LAB — H PYLORI, IGM, IGG, IGA AB
H pylori, IgM Abs: <9 units (ref 0.0–8.9)
H. pylori, IgA Abs: <9 units (ref 0.0–8.9)
H. pylori, IgG AbS: 0.11 Index Value (ref 0.00–0.79)

## 2020-05-07 ENCOUNTER — Telehealth: Payer: Self-pay

## 2020-05-07 NOTE — Telephone Encounter (Signed)
[facility] was called to add GGT. They stated that they will run it through and if it was possible that they would send [place] a form to fill out and if it was not possible, they would also send [place] form notifying us that it was not possible.

## 2020-05-07 NOTE — Telephone Encounter (Signed)
-----   Message from Virgel Manifold, MD sent at 05/04/2020  4:16 PM EDT ----- Herb Grays please see if lab can add on GGT to her lab draw. If not, please order separately.

## 2020-05-10 ENCOUNTER — Telehealth: Payer: Self-pay | Admitting: Gastroenterology

## 2020-05-10 NOTE — Telephone Encounter (Signed)
Patient Melissa English asking for call back regarding results.  Please call to advise

## 2020-05-10 NOTE — Telephone Encounter (Signed)
Dr. Bonna Gains, can you please let me know what to tell her. Thank you.

## 2020-05-11 LAB — SPECIMEN STATUS REPORT

## 2020-05-11 LAB — GAMMA GT: GGT: 20 IU/L (ref 0–60)

## 2020-05-12 NOTE — Telephone Encounter (Signed)
Lab was obtained and Dr. Bonna Gains has already reviewed.

## 2020-05-12 NOTE — Telephone Encounter (Signed)
Melissa Manifold, MD  [name], [place] please let the patient know, her GGT is normal. Which means the elevation in alk phos is not coming from the liver and may be due to other causes such as meds or bone causes. She should follow up with PCP in this regard. No evidence of H Pylori on blood work.   Is the increased dose of Prilosec helping her pain?    Called patient and left her a detailed message letting her know what Dr. Chuck Hint recommend and asked her if the medication has helped with her pain.

## 2020-05-18 ENCOUNTER — Encounter: Payer: Self-pay | Admitting: Gastroenterology

## 2020-05-19 ENCOUNTER — Other Ambulatory Visit: Payer: Self-pay | Admitting: Internal Medicine

## 2020-06-02 ENCOUNTER — Ambulatory Visit: Payer: Medicare Other | Admitting: Gastroenterology

## 2020-06-16 DIAGNOSIS — G4733 Obstructive sleep apnea (adult) (pediatric): Secondary | ICD-10-CM | POA: Diagnosis not present

## 2020-06-23 ENCOUNTER — Encounter: Payer: Self-pay | Admitting: Gastroenterology

## 2020-06-23 ENCOUNTER — Ambulatory Visit: Payer: Medicare Other | Admitting: Gastroenterology

## 2020-06-23 ENCOUNTER — Other Ambulatory Visit: Payer: Self-pay

## 2020-06-23 VITALS — BP 119/69 | HR 58 | Temp 97.9°F | Ht 67.0 in | Wt 207.0 lb

## 2020-06-23 DIAGNOSIS — K219 Gastro-esophageal reflux disease without esophagitis: Secondary | ICD-10-CM

## 2020-06-23 MED ORDER — FAMOTIDINE 20 MG PO TABS: 20.0000 mg | ORAL_TABLET | Freq: Every day | ORAL | 0 refills | Status: DC

## 2020-06-23 NOTE — Progress Notes (Signed)
Melissa Antigua, MD [address]  [place]  [place], [place] [address]  Main: [phone]  Fax: [phone]   Primary Care Physician: Melissa Athens, MD   Chief Complaint  Patient presents with   Follow-up    Abdominal pain/GERD - no symptoms    HPI: Melissa English is a 70 y.o. female here for follow-up of abdominal pain.  PPI was increased to twice daily on last visit and patient reports complete resolution of symptoms with this.  After 30 days of taking this at twice a day dosing, she has decreased it to once a day and still remains asymptomatic.  States has been on omeprazole for years and without this, she gets severe heartburn symptoms.  No dysphagia.  No nausea or vomiting.   ROS: All ROS reviewed a negative except as per HPI   Past Medical History:  Diagnosis Date   Anxiety    Arthritis 2018   hands, back...getting injections for back   GERD (gastroesophageal reflux disease)    HLD (hyperlipidemia)    HTN (hypertension)    Hypothyroidism    Sleep apnea    uses cpap    Past Surgical History:  Procedure Laterality Date   ABDOMINAL HYSTERECTOMY     BACK SURGERY  11/05/2017   CARPAL TUNNEL RELEASE Right 2008   Dr. Derrel Nip   CARPAL TUNNEL RELEASE Left 11/01/2016   Procedure: CARPAL TUNNEL RELEASE;  Surgeon: Melissa Leys, MD;  Location: [facility] ORS;  Service: Orthopedics;  Laterality: Left;   CHOLECYSTECTOMY     COLONOSCOPY WITH PROPOFOL N/A 10/03/2019   Procedure: COLONOSCOPY WITH PROPOFOL;  Surgeon: Melissa Manifold, MD;  Location: [facility] ENDOSCOPY;  Service: Endoscopy;  Laterality: N/A;   LUMBAR DISC SURGERY  1998   L4 - L5.  no metal in back   [place]    Prior to Admission medications   Medication Sig Start Date End Date Taking? Authorizing Provider  acetaminophen (TYLENOL) 650 MG CR tablet 1 tab as needed for pain, 2- 3 times/days, 90 days 08/08/17  Yes [provider]  aspirin 81 MG tablet Take 81 mg by mouth  daily.   Yes [provider]  atorvastatin (LIPITOR) 20 MG tablet TAKE 1 TABLET BY MOUTH  DAILY 05/20/20  Yes Melissa Athens, MD  Calcium-Magnesium-Zinc [phone] MG TABS Take by mouth daily.    Yes [provider]  famotidine (PEPCID) 20 MG tablet Take 1 tablet (20 mg total) by mouth daily. 06/23/20 09/21/20 Yes Melissa Manifold, MD  FLUoxetine (PROZAC) 20 MG capsule Take 1 capsule (20 mg total) by mouth daily. 04/06/20  Yes Melissa, Viann Shove, MD  levothyroxine (SYNTHROID) 150 MCG tablet Take 1 tablet (150 mcg total) by mouth daily. 03/18/20  Yes Melissa Salts, FNP  lisinopril (ZESTRIL) 10 MG tablet TAKE 1 TABLET BY MOUTH  DAILY 05/20/20  Yes Melissa Athens, MD  gabapentin (NEURONTIN) 300 MG capsule gabapentin 300 mg capsule Patient not taking: Reported on 06/23/2020    [provider]    Family History  Problem Relation Age of Onset   Breast cancer Neg Hx      Social History   Tobacco Use   Smoking status: Never   Smokeless tobacco: Never  Vaping Use   Vaping Use: Never used  Substance Use Topics   Alcohol use: Yes    Comment: occasional wine or beer   Drug use: No    Allergies as of 06/23/2020 - Review Complete 06/23/2020  Allergen  Reaction Noted   Codeine Nausea And Vomiting and Other (See Comments) 07/20/2010   Sulfa antibiotics Nausea And Vomiting 07/20/2010   Other Other (See Comments) 08/30/2012   Sulfasalazine  07/20/2010   Prednisone Other (See Comments) 08/08/2017    Physical Examination:   BP 119/69   Pulse (!) 58   Temp 97.9 F (36.6 C) (Oral)   Ht 5\' 7"  (1.702 m)   Wt 207 lb (93.9 kg)   BMI 32.42 kg/m   Constitutional: General:   Alert,  Well-developed, well-nourished, pleasant and cooperative in NAD BP 119/69   Pulse (!) 58   Temp 97.9 F (36.6 C) (Oral)   Ht 5\' 7"  (1.702 m)   Wt 207 lb (93.9 kg)   BMI 32.42 kg/m   Eyes:  Sclera clear, no icterus.   Conjunctiva pink. PERRLA  Ears:  No scars, lesions or masses, Normal  auditory acuity. Nose:  No deformity, discharge, or lesions. Mouth:  No deformity or lesions, oropharynx pink & moist.  Neck:  Supple; no masses or thyromegaly.  Respiratory: Normal respiratory effort, Normal percussion  Gastrointestinal:  Normal bowel sounds.  No bruits.  Soft, non-tender and non-distended without masses, hepatosplenomegaly or hernias noted.  No guarding or rebound tenderness.     Cardiac: No clubbing or edema.  No cyanosis. Normal posterior tibial pedal pulses noted.  Lymphatic:  No significant cervical or axillary adenopathy.  Psych:  Alert and cooperative. Normal mood and affect.  Musculoskeletal:  Normal gait. Head normocephalic, atraumatic. Symmetrical without gross deformities. 5/5 Upper and Lower extremity strength bilaterally.  Skin: Warm. Intact without significant lesions or rashes. No jaundice.  Neurologic:  Face symmetrical, tongue midline, Normal sensation to touch;  grossly normal neurologically.  Psych:  Alert and oriented x3, Alert and cooperative. Normal mood and affect.  Labs: CMP     Component Value Date/Time   NA 140 08/05/2019 1027   NA 140 09/16/2012 1304   K 4.6 08/05/2019 1027   K 4.0 09/16/2012 1304   CL 103 08/05/2019 1027   CL 107 09/16/2012 1304   CO2 28 08/05/2019 1027   CO2 30 09/16/2012 1304   GLUCOSE 117 (H) 08/05/2019 1027   GLUCOSE 108 (H) 09/16/2012 1304   BUN 16 08/05/2019 1027   BUN 7 09/16/2012 1304   CREATININE 0.82 08/05/2019 1027   CALCIUM 9.6 08/05/2019 1027   CALCIUM 9.2 09/16/2012 1304   PROT 7.3 05/03/2020 1504   PROT 7.3 06/16/2011 1349   ALBUMIN 4.5 05/03/2020 1504   ALBUMIN 3.9 06/16/2011 1349   AST 18 05/03/2020 1504   AST 30 06/16/2011 1349   ALT 15 05/03/2020 1504   ALT 41 06/16/2011 1349   ALKPHOS 142 (H) 05/03/2020 1504   ALKPHOS 99 06/16/2011 1349   BILITOT 0.4 05/03/2020 1504   BILITOT 0.6 06/16/2011 1349   GFRNONAA 72 08/05/2019 1027   GFRAA 84 08/05/2019 1027   Lab Results  Component  Value Date   WBC 6.8 08/05/2019   HGB 13.3 08/05/2019   HCT 40.6 08/05/2019   MCV 95.8 08/05/2019   PLT 172 08/05/2019    Imaging Studies:    Assessment and Plan:   Melissa English is a 70 y.o. y/o female here for follow-up of abdominal pain and reflux  Symptoms have resolved with PPI We did discuss changing therapy to H2 RA or discontinuing PPI.  Patient is apprehensive about discontinuing PPI, but is willing to try H2 RA instead of no medications  She can continue PPI  once daily for another month and then try to switch to H2 RA and see if her symptoms remain well controlled  If symptoms return H2 RA have advised patient to let us know and she verbalized understanding  We had an extensive discussion with patient and husband about risks associated with PPI use, and she understands these risks and states that due to severe symptoms without PPI, if symptoms were to recur, she would rather continue the PPI than not  (Risks of PPI use were discussed with patient including bone loss, C. Diff diarrhea, pneumonia, infections, CKD, electrolyte abnormalities.  Pt. Verbalizes understanding and chooses to continue the medication.)  Continue acid reflux lifestyle measures as well  We did discuss the possibility of EGD in the future if her symptoms remain uncontrolled or if she is unable to get off PPI given that she has chronic GERD  We discussed follow-up appointment in the next few months, but patient states she would not like to make a follow-up appointment at this time and would like to follow-up as needed   Dr Melissa English

## 2020-07-18 DIAGNOSIS — R52 Pain, unspecified: Secondary | ICD-10-CM | POA: Diagnosis not present

## 2020-07-18 DIAGNOSIS — Z20822 Contact with and (suspected) exposure to covid-19: Secondary | ICD-10-CM | POA: Diagnosis not present

## 2020-07-18 DIAGNOSIS — J029 Acute pharyngitis, unspecified: Secondary | ICD-10-CM | POA: Diagnosis not present

## 2020-07-18 DIAGNOSIS — R059 Cough, unspecified: Secondary | ICD-10-CM | POA: Diagnosis not present

## 2020-07-18 DIAGNOSIS — U071 COVID-19: Secondary | ICD-10-CM | POA: Diagnosis not present

## 2020-07-18 DIAGNOSIS — R0981 Nasal congestion: Secondary | ICD-10-CM | POA: Diagnosis not present

## 2020-07-20 ENCOUNTER — Other Ambulatory Visit: Payer: Self-pay | Admitting: *Deleted

## 2020-07-20 MED ORDER — ALBUTEROL SULFATE (2.5 MG/3ML) 0.083% IN NEBU: 2.5000 mg | INHALATION_SOLUTION | Freq: Four times a day (QID) | RESPIRATORY_TRACT | 1 refills | Status: DC | PRN

## 2020-07-20 MED ORDER — METHYLPREDNISOLONE 4 MG PO TBPK: ORAL_TABLET | ORAL | 0 refills | Status: DC

## 2020-07-22 DIAGNOSIS — U071 COVID-19: Secondary | ICD-10-CM | POA: Diagnosis not present

## 2020-08-24 ENCOUNTER — Other Ambulatory Visit: Payer: Self-pay | Admitting: Gastroenterology

## 2020-11-03 ENCOUNTER — Encounter: Payer: Self-pay | Admitting: Internal Medicine

## 2020-11-03 ENCOUNTER — Ambulatory Visit (INDEPENDENT_AMBULATORY_CARE_PROVIDER_SITE_OTHER): Payer: Medicare Other | Admitting: Internal Medicine

## 2020-11-03 ENCOUNTER — Other Ambulatory Visit: Payer: Self-pay

## 2020-11-03 VITALS — BP 143/78 | HR 61 | Ht 67.0 in | Wt 201.1 lb

## 2020-11-03 DIAGNOSIS — Z0000 Encounter for general adult medical examination without abnormal findings: Secondary | ICD-10-CM

## 2020-11-03 DIAGNOSIS — F419 Anxiety disorder, unspecified: Secondary | ICD-10-CM | POA: Diagnosis not present

## 2020-11-03 DIAGNOSIS — I10 Essential (primary) hypertension: Secondary | ICD-10-CM

## 2020-11-03 DIAGNOSIS — Z23 Encounter for immunization: Secondary | ICD-10-CM | POA: Diagnosis not present

## 2020-11-03 DIAGNOSIS — E063 Autoimmune thyroiditis: Secondary | ICD-10-CM

## 2020-11-03 DIAGNOSIS — Z1231 Encounter for screening mammogram for malignant neoplasm of breast: Secondary | ICD-10-CM

## 2020-11-03 DIAGNOSIS — K219 Gastro-esophageal reflux disease without esophagitis: Secondary | ICD-10-CM

## 2020-11-03 DIAGNOSIS — E038 Other specified hypothyroidism: Secondary | ICD-10-CM

## 2020-11-03 DIAGNOSIS — L570 Actinic keratosis: Secondary | ICD-10-CM | POA: Diagnosis not present

## 2020-11-03 DIAGNOSIS — M5416 Radiculopathy, lumbar region: Secondary | ICD-10-CM

## 2020-11-03 DIAGNOSIS — L738 Other specified follicular disorders: Secondary | ICD-10-CM | POA: Insufficient documentation

## 2020-11-03 MED ORDER — ALPRAZOLAM 0.5 MG PO TABS
0.5000 mg | ORAL_TABLET | Freq: Two times a day (BID) | ORAL | 0 refills | Status: DC | PRN
Start: 2020-11-03 — End: 2020-12-13

## 2020-11-03 NOTE — Addendum Note (Signed)
Addended by: Alois Cliche on: 11/03/2020 12:49 PM   Modules accepted: Orders

## 2020-11-03 NOTE — Assessment & Plan Note (Signed)

## 2020-11-03 NOTE — Progress Notes (Signed)
Established Patient Office Visit  Subjective:  Patient ID: DAIJA ROUTSON, female    DOB: 07-24-1950  Age: 70 y.o. MRN: [id number]  CC:  Chief Complaint  Patient presents with   Follow-up    HPI  ALASHA MCGUINNESS presents for general check  Past Medical History:  Diagnosis Date   Anxiety    Arthritis 2018   hands, back...getting injections for back   GERD (gastroesophageal reflux disease)    HLD (hyperlipidemia)    HTN (hypertension)    Hypothyroidism    Sleep apnea    uses cpap    Past Surgical History:  Procedure Laterality Date   ABDOMINAL HYSTERECTOMY     BACK SURGERY  11/05/2017   CARPAL TUNNEL RELEASE Right 2008   Dr. Derrel Nip   CARPAL TUNNEL RELEASE Left 11/01/2016   Procedure: CARPAL TUNNEL RELEASE;  Surgeon: Earnestine Leys, MD;  Location: [facility] OR[facility];  Service: Orthopedics;  Laterality: Left;   CHOLECYSTECTOMY     COLONOSCOPY WITH PROPOFOL N/A 10/03/2019   Procedure: COLONOSCOPY WITH PROPOFOL;  Surgeon: Virgel Manifold, MD;  Location: [facility];  Service: Endoscopy;  Laterality: N/A;   LUMBAR DISC SURGERY  1998   L4 - L5.  no metal in back   TOTAL VAGINAL HYSTERECTOMY  1998    Family History  Problem Relation Age of Onset   Breast cancer Neg Hx     Social History   Socioeconomic History   Marital status: Married    Spouse name: Not on file   Number of children: 3   Years of education: Not on file   Highest education level: Not on file  Occupational History   Occupation: housewife  Tobacco Use   Smoking status: Never   Smokeless tobacco: Never  Vaping Use   Vaping Use: Never used  Substance and Sexual Activity   Alcohol use: Yes    Comment: occasional wine or beer   Drug use: No   Sexual activity: Not on file  Other Topics Concern   Not on file  Social History Narrative   Not on file   Social Determinants of Health   Financial Resource Strain: Not on file  Food Insecurity: Not on file  Transportation Needs: Not on file  Physical  Activity: Not on file  Stress: Not on file  Social Connections: Not on file  Intimate Partner Violence: Not on file     Current Outpatient Medications:    acetaminophen (TYLENOL) 650 MG CR tablet, 1 tab as needed for pain, 2- 3 times/days, 90 days, Disp: , Rfl:    albuterol (PROVENTIL) (2.5 MG/3ML) 0.083% nebulizer solution, Take 3 mLs (2.5 mg total) by nebulization every 6 (six) hours as needed for wheezing or shortness of breath., Disp: 150 mL, Rfl: 1   ALPRAZolam (XANAX) 0.5 MG tablet, Take 1 tablet (0.5 mg total) by mouth 2 (two) times daily as needed for anxiety., Disp: 60 tablet, Rfl: 0   aspirin 81 MG tablet, Take 81 mg by mouth daily., Disp: , Rfl:    atorvastatin (LIPITOR) 20 MG tablet, TAKE 1 TABLET BY MOUTH  DAILY, Disp: 90 tablet, Rfl: 3   Calcium-Magnesium-Zinc 333-133-5 MG TABS, Take by mouth daily. , Disp: , Rfl:    famotidine (PEPCID) 20 MG tablet, TAKE 1 TABLET BY MOUTH  DAILY, Disp: 90 tablet, Rfl: 1   FLUoxetine (PROZAC) 20 MG capsule, Take 1 capsule (20 mg total) by mouth daily., Disp: 90 capsule, Rfl: 3   gabapentin (NEURONTIN) 300 MG capsule, ,  Disp: , Rfl:    levothyroxine (SYNTHROID) 150 MCG tablet, Take 1 tablet (150 mcg total) by mouth daily., Disp: 90 tablet, Rfl: 3   lisinopril (ZESTRIL) 10 MG tablet, TAKE 1 TABLET BY MOUTH  DAILY, Disp: 90 tablet, Rfl: 3   methylPREDNISolone (MEDROL DOSEPAK) 4 MG TBPK tablet, USE AS DIRECTED, Disp: 21 tablet, Rfl: 0   Allergies  Allergen Reactions   Codeine Nausea And Vomiting and Other (See Comments)    Severe n & v Severe n & v   Sulfa Antibiotics Nausea And Vomiting    Occurred a long time ago so patient is unsure of reaction   Other Other (See Comments)   Sulfasalazine    Prednisone Other (See Comments)    Could not sleep and felt nervous.    ROS Review of Systems  Constitutional: Negative.   HENT: Negative.    Eyes: Negative.   Respiratory: Negative.    Cardiovascular: Negative.   Gastrointestinal: Negative.    Endocrine: Negative.   Genitourinary: Negative.   Musculoskeletal: Negative.   Skin: Negative.   Allergic/Immunologic: Negative.   Neurological: Negative.   Hematological: Negative.   Psychiatric/Behavioral: Negative.    All other systems reviewed and are negative.    Objective:    Physical Exam Vitals reviewed.  Constitutional:      Appearance: Normal appearance.  HENT:     Mouth/Throat:     Mouth: Mucous membranes are moist.  Eyes:     Pupils: Pupils are equal, round, and reactive to light.  Neck:     Vascular: No carotid bruit.  Cardiovascular:     Rate and Rhythm: Normal rate and regular rhythm.     Pulses: Normal pulses.     Heart sounds: Normal heart sounds.  Pulmonary:     Effort: Pulmonary effort is normal.     Breath sounds: Normal breath sounds.  Abdominal:     General: Bowel sounds are normal.     Palpations: Abdomen is soft. There is no hepatomegaly, splenomegaly or mass.     Tenderness: There is no abdominal tenderness.     Hernia: No hernia is present.  Musculoskeletal:        General: No tenderness.     Cervical back: Neck supple.     Right lower leg: No edema.     Left lower leg: No edema.  Skin:    Findings: No rash.  Neurological:     Mental Status: She is alert and oriented to person, place, and time.     Motor: No weakness.  Psychiatric:        Mood and Affect: Mood and affect normal.        Behavior: Behavior normal.    BP (!) 143/78   Pulse 61   Ht 5\' 7"  (1.702 m)   Wt 201 lb 1.6 oz (91.2 kg)   BMI 31.50 kg/m  Wt Readings from Last 3 Encounters:  11/03/20 201 lb 1.6 oz (91.2 kg)  06/23/20 207 lb (93.9 kg)  05/03/20 203 lb 9.6 oz (92.4 kg)     Health Maintenance Due  Topic Date Due   Hepatitis C Screening  Never done   TETANUS/TDAP  Never done   DEXA SCAN  Never done   COVID-19 Vaccine (3 - Moderna risk series) 04/30/2019    There are no preventive care reminders to display for this patient.  Lab Results  Component  Value Date   TSH 0.03 (L) 03/17/2020   Lab Results  Component Value  Date   WBC 6.8 08/05/2019   HGB 13.3 08/05/2019   HCT 40.6 08/05/2019   MCV 95.8 08/05/2019   PLT 172 08/05/2019   Lab Results  Component Value Date   NA 140 08/05/2019   K 4.6 08/05/2019   CO2 28 08/05/2019   GLUCOSE 117 (H) 08/05/2019   BUN 16 08/05/2019   CREATININE 0.82 08/05/2019   BILITOT 0.4 05/03/2020   ALKPHOS 142 (H) 05/03/2020   AST 18 05/03/2020   ALT 15 05/03/2020   PROT 7.3 05/03/2020   ALBUMIN 4.5 05/03/2020   CALCIUM 9.6 08/05/2019   ANIONGAP 3 (L) 09/16/2012   Lab Results  Component Value Date   CHOL 185 08/05/2019   Lab Results  Component Value Date   HDL 62 08/05/2019   Lab Results  Component Value Date   LDLCALC 101 (H) 08/05/2019   Lab Results  Component Value Date   TRIG 121 08/05/2019   Lab Results  Component Value Date   CHOLHDL 3.0 08/05/2019   No results found for: HGBA1C    Assessment & Plan:   Problem List Items Addressed This Visit       Cardiovascular and Mediastinum   Hypertension     Patient denies any chest pain or shortness of breath there is no history of palpitation or paroxysmal nocturnal dyspnea   patient was advised to follow low-salt low-cholesterol diet    ideally I want to keep systolic blood pressure below 130 mmHg, patient was asked to check blood pressure one times a week and give me a report on that.  Patient will be follow-up in 3 months  or earlier as needed, patient will call me back for any change in the cardiovascular symptoms Patient was advised to buy a book from local bookstore concerning blood pressure and read several chapters  every day.  This will be supplemented by some of the material we will give him from the office.  Patient should also utilize other resources like YouTube and Internet to learn more about the blood pressure and the diet.        Digestive   Gastroesophageal reflux disease without esophagitis    - The  patient's GERD is stable on medication.  - Instructed the patient to avoid eating spicy and acidic foods, as well as foods high in fat. - Instructed the patient to avoid eating large meals or meals 2-3 hours prior to sleeping.        Endocrine   Hypothyroidism due to Hashimoto's thyroiditis    Stable at the present time        Nervous and Auditory   Lumbar radiculitis    Patient is doing well after back surgery      Relevant Medications   ALPRAZolam (XANAX) 0.5 MG tablet     Other   Annual physical exam    Normal physical exam, heart is regular chest is clear abdominal soft nontender without any hepatosplenomegaly there is no pedal edema no calf tenderness.  Peripheral pulses are 1+.  There is no evidence of dermatitis.  Neurological examination is nonfocal.      Relevant Orders   CBC with Differential/Platelet   COMPLETE METABOLIC PANEL WITH GFR   TSH   Lipid panel   Hepatitis C Antibody   Other Visit Diagnoses     Need for pneumococcal vaccination    -  Primary   Relevant Orders   Pneumococcal conjugate vaccine 20-valent (Prevnar 20) (Completed)   Need for influenza vaccination  Relevant Orders   Flu Vaccine QUAD High Dose(Fluad) (Completed)       Meds ordered this encounter  Medications   ALPRAZolam (XANAX) 0.5 MG tablet    Sig: Take 1 tablet (0.5 mg total) by mouth 2 (two) times daily as needed for anxiety.    Dispense:  60 tablet    Refill:  0    Follow-up: No follow-ups on file.    Cletis Athens, MD

## 2020-11-03 NOTE — Assessment & Plan Note (Signed)
-   The patient's GERD is stable on medication.  - Instructed the patient to avoid eating spicy and acidic foods, as well as foods high in fat. - Instructed the patient to avoid eating large meals or meals 2-3 hours prior to sleeping. 

## 2020-11-03 NOTE — Assessment & Plan Note (Signed)
Patient is doing well after back surgery

## 2020-11-03 NOTE — Assessment & Plan Note (Signed)
Stable at the present time. 

## 2020-11-03 NOTE — Assessment & Plan Note (Signed)
Normal physical exam, heart is regular chest is clear abdominal soft nontender without any hepatosplenomegaly there is no pedal edema no calf tenderness.  Peripheral pulses are 1+.  There is no evidence of dermatitis.  Neurological examination is nonfocal.

## 2020-11-04 LAB — HEPATITIS C ANTIBODY
Hepatitis C Ab: NONREACTIVE
SIGNAL TO CUT-OFF: 0.01 (ref <1.00)

## 2020-11-04 LAB — COMPLETE METABOLIC PANEL WITH GFR
AG Ratio: 1.4 (calc) (ref 1.0–2.5)
ALT: 15 U/L (ref 6–29)
AST: 15 U/L (ref 10–35)
Albumin: 3.8 g/dL (ref 3.6–5.1)
Alkaline phosphatase (APISO): 120 U/L (ref 37–153)
BUN: 17 mg/dL (ref 7–25)
CO2: 20 mmol/L (ref 20–32)
Calcium: 8.7 mg/dL (ref 8.6–10.4)
Chloride: 105 mmol/L (ref 98–110)
Creat: 0.75 mg/dL (ref 0.60–1.00)
Globulin: 2.8 g/dL (calc) (ref 1.9–3.7)
Glucose, Bld: 110 mg/dL — ABNORMAL HIGH (ref 65–99)
Potassium: 4.2 mmol/L (ref 3.5–5.3)
Sodium: 139 mmol/L (ref 135–146)
Total Bilirubin: 0.7 mg/dL (ref 0.2–1.2)
Total Protein: 6.6 g/dL (ref 6.1–8.1)
eGFR: 85 mL/min/{1.73_m2} (ref >=60)

## 2020-11-04 LAB — CBC WITH DIFFERENTIAL/PLATELET
Absolute Monocytes: 599 cells/uL (ref 200–950)
Basophils Absolute: 51 cells/uL (ref 0–200)
Basophils Relative: 0.7 %
Eosinophils Absolute: 146 cells/uL (ref 15–500)
Eosinophils Relative: 2 %
HCT: 38.8 % (ref 35.0–45.0)
Hemoglobin: 12.4 g/dL (ref 11.7–15.5)
Lymphs Abs: 1716 cells/uL (ref 850–3900)
MCH: 30.1 pg (ref 27.0–33.0)
MCHC: 32 g/dL (ref 32.0–36.0)
MCV: 94.2 fL (ref 80.0–100.0)
MPV: 12.2 fL (ref 7.5–12.5)
Monocytes Relative: 8.2 %
Neutro Abs: 4789 cells/uL (ref 1500–7800)
Neutrophils Relative %: 65.6 %
Platelets: 174 10*3/uL (ref 140–400)
RBC: 4.12 10*6/uL (ref 3.80–5.10)
RDW: 12.5 % (ref 11.0–15.0)
Total Lymphocyte: 23.5 %
WBC: 7.3 10*3/uL (ref 3.8–10.8)

## 2020-11-04 LAB — LIPID PANEL
Cholesterol: 162 mg/dL
HDL: 51 mg/dL
LDL Cholesterol (Calc): 89 mg/dL
Non-HDL Cholesterol (Calc): 111 mg/dL
Total CHOL/HDL Ratio: 3.2 (calc)
Triglycerides: 120 mg/dL

## 2020-11-04 LAB — TSH: TSH: <0.01 m[IU]/L — ABNORMAL LOW (ref 0.40–4.50)

## 2020-11-05 ENCOUNTER — Ambulatory Visit (INDEPENDENT_AMBULATORY_CARE_PROVIDER_SITE_OTHER): Payer: Medicare Other | Admitting: *Deleted

## 2020-11-05 DIAGNOSIS — Z1239 Encounter for other screening for malignant neoplasm of breast: Secondary | ICD-10-CM

## 2020-11-05 DIAGNOSIS — Z0000 Encounter for general adult medical examination without abnormal findings: Secondary | ICD-10-CM

## 2020-11-05 NOTE — Progress Notes (Signed)
Subjective:   Melissa English is a 70 y.o. female who presents for Medicare Annual (Subsequent) preventive examination.  I discussed the limitations of evaluation and management by telemedicine and the availability of in person appointments. Patient expressed understanding and agreed to proceed.   Visit performed using audio  Patient:home Provider:home   Review of Systems    Defer to provider  Cardiac Risk Factors include: advanced age (>39men, >26 women);hypertension     Objective:    There were no vitals filed for this visit. There is no height or weight on file to calculate BMI.  Advanced Directives 11/05/2020 10/03/2019 10/20/2016  Does Patient Have a Medical Advance Directive? No No No  Would patient like information on creating a medical advance directive? No - Patient declined No - Patient declined No - Patient declined    Current Medications (verified) Outpatient Encounter Medications as of 11/05/2020  Medication Sig   acetaminophen (TYLENOL) 650 MG CR tablet 1 tab as needed for pain, 2- 3 times/days, 90 days   albuterol (PROVENTIL) (2.5 MG/3ML) 0.083% nebulizer solution Take 3 mLs (2.5 mg total) by nebulization every 6 (six) hours as needed for wheezing or shortness of breath.   ALPRAZolam (XANAX) 0.5 MG tablet Take 1 tablet (0.5 mg total) by mouth 2 (two) times daily as needed for anxiety.   aspirin 81 MG tablet Take 81 mg by mouth daily.   atorvastatin (LIPITOR) 20 MG tablet TAKE 1 TABLET BY MOUTH  DAILY   Calcium-Magnesium-Zinc 333-133-5 MG TABS Take by mouth daily.    famotidine (PEPCID) 20 MG tablet TAKE 1 TABLET BY MOUTH  DAILY   FLUoxetine (PROZAC) 20 MG capsule Take 1 capsule (20 mg total) by mouth daily.   gabapentin (NEURONTIN) 300 MG capsule    levothyroxine (SYNTHROID) 150 MCG tablet Take 1 tablet (150 mcg total) by mouth daily.   lisinopril (ZESTRIL) 10 MG tablet TAKE 1 TABLET BY MOUTH  DAILY   methylPREDNISolone (MEDROL DOSEPAK) 4 MG TBPK tablet USE AS  DIRECTED   No facility-administered encounter medications on file as of 11/05/2020.    Allergies (verified) Codeine, Sulfa antibiotics, Other, Sulfasalazine, and Prednisone   History: Past Medical History:  Diagnosis Date   Anxiety    Arthritis 2018   hands, back...getting injections for back   GERD (gastroesophageal reflux disease)    HLD (hyperlipidemia)    HTN (hypertension)    Hypothyroidism    Sleep apnea    uses cpap   Past Surgical History:  Procedure Laterality Date   ABDOMINAL HYSTERECTOMY     BACK SURGERY  11/05/2017   CARPAL TUNNEL RELEASE Right 2008   Dr. Derrel Nip   CARPAL TUNNEL RELEASE Left 11/01/2016   Procedure: CARPAL TUNNEL RELEASE;  Surgeon: Earnestine Leys, MD;  Location: [facility];  Service: Orthopedics;  Laterality: Left;   CHOLECYSTECTOMY     COLONOSCOPY WITH PROPOFOL N/A 10/03/2019   Procedure: COLONOSCOPY WITH PROPOFOL;  Surgeon: Virgel Manifold, MD;  Location: [facility];  Service: Endoscopy;  Laterality: N/A;   LUMBAR DISC SURGERY  1998   L4 - L5.  no metal in back   TOTAL VAGINAL HYSTERECTOMY  1998   Family History  Problem Relation Age of Onset   Breast cancer Neg Hx    Social History   Socioeconomic History   Marital status: Married    Spouse name: Not on file   Number of children: 3   Years of education: Not on file   Highest education level: Not on  file  Occupational History   Occupation: housewife  Tobacco Use   Smoking status: Never   Smokeless tobacco: Never  Vaping Use   Vaping Use: Never used  Substance and Sexual Activity   Alcohol use: Yes    Comment: occasional wine or beer   Drug use: No   Sexual activity: Not on file  Other Topics Concern   Not on file  Social History Narrative   Not on file   Social Determinants of Health   Financial Resource Strain: Low Risk    Difficulty of Paying Living Expenses: Not hard at all  Food Insecurity: No Food Insecurity   Worried About Charity fundraiser in the Last  Year: Never true   [place] in the Last Year: Never true  Transportation Needs: No Transportation Needs   Lack of Transportation (Medical): No   Lack of Transportation (Non-Medical): No  Physical Activity: Insufficiently Active   Days of Exercise per Week: 2 days   Minutes of Exercise per Session: 60 min  Stress: No Stress Concern Present   Feeling of Stress : Not at all  Social Connections: Socially Integrated   Frequency of Communication with Friends and Family: More than three times a week   Frequency of Social Gatherings with Friends and Family: More than three times a week   Attends Religious Services: More than 4 times per year   Active Member of [employer] or Organizations: Yes   Attends Music therapist: More than 4 times per year   Marital Status: Married    Tobacco Counseling Counseling given: Not Answered   Clinical Intake:  Pre-visit preparation completed: Yes  Pain : No/denies pain     Diabetes: No  How often do you need to have someone help you when you read instructions, pamphlets, or other written materials from your doctor or pharmacy?: 1 - Never What is the last grade level you completed in school?: 11th  Diabetic?no  Interpreter Needed?: No  Information entered by :: Lacretia Nicks,  [place]   Activities of Daily Living In your present state of health, do you have any difficulty performing the following activities: 11/05/2020  Hearing? Y  Comment has hearing aids  Vision? N  Difficulty concentrating or making decisions? N  Walking or climbing stairs? N  Dressing or bathing? N  Doing errands, shopping? N  Preparing Food and eating ? N  Using the Toilet? N  In the past six months, have you accidently leaked urine? N  Do you have problems with loss of bowel control? N  Managing your Medications? N  Managing your Finances? N  Housekeeping or managing your Housekeeping? N  Some recent data might be hidden    Patient Care  Team: Cletis Athens, MD as PCP - General (Internal Medicine)  Indicate any recent Medical Services you may have received from other than [facility] providers in the past year (date may be approximate).     Assessment:   This is a routine wellness examination for Melissa English.  Hearing/Vision screen No results found.  Dietary issues and exercise activities discussed: Current Exercise Habits: Home exercise routine, Type of exercise: walking, Time (Minutes): 60, Frequency (Times/Week): 2, Weekly Exercise (Minutes/Week): 120, Intensity: Mild, Exercise limited by: None identified   Goals Addressed   None    Depression Screen PHQ 2/9 Scores 11/05/2020 11/03/2020 08/05/2019  PHQ - 2 Score 0 0 0    Fall Risk Fall Risk  11/05/2020 11/03/2020 08/05/2019  Falls in  the past year? 0 0 0  Number falls in past yr: 0 0 0  Injury with Fall? 0 0 0  Risk for fall due to : No Fall Risks No Fall Risks No Fall Risks  Follow up Falls evaluation completed Falls evaluation completed -    FALL RISK PREVENTION PERTAINING TO THE HOME:  Any stairs in or around the home? Yes  If so, are there any without handrails? No  Home free of loose throw rugs in walkways, pet beds, electrical cords, etc? Yes  Adequate lighting in your home to reduce risk of falls? Yes   ASSISTIVE DEVICES UTILIZED TO PREVENT FALLS:  Life alert? No  Use of a cane, walker or w/c? No  Grab bars in the bathroom? No  Shower chair or bench in shower? No  Elevated toilet seat or a handicapped toilet? No   TIMED UP AND GO:  Was the test performed? No .  Length of time to ambulate- NA  Gait steady and fast without use of assistive device  Cognitive Function: MMSE - Mini Mental State Exam 11/05/2020 11/05/2020  Not completed: Unable to complete Unable to complete     6CIT Screen 11/05/2020 11/05/2020  What Year? - 0 points  What month? - 0 points  What time? - 0 points  Count back from 20 - 0 points  Months in reverse 0 points 0 points   Repeat phrase 0 points -    Immunizations Immunization History  Administered Date(s) Administered   Fluad Quad(high Dose 65+) 11/03/2020   Moderna Sars-Covid-2 Vaccination 03/05/2019, 04/02/2019   PNEUMOCOCCAL CONJUGATE-20 11/03/2020   Zoster Recombinat (Shingrix) 03/18/2017, 12/22/2017    TDAP status: Due, Education has been provided regarding the importance of this vaccine. Advised may receive this vaccine at local pharmacy or Health Dept. Aware to provide a copy of the vaccination record if obtained from local pharmacy or Health Dept. Verbalized acceptance and understanding.  Flu Vaccine status: Up to date  Pneumococcal vaccine status: Up to date  Covid-19 vaccine status: Declined, Education has been provided regarding the importance of this vaccine but patient still declined. Advised may receive this vaccine at local pharmacy or Health Dept.or vaccine clinic. Aware to provide a copy of the vaccination record if obtained from local pharmacy or Health Dept. Verbalized acceptance and understanding.  Qualifies for Shingles Vaccine? No   Zostavax completed No   Shingrix Completed?: Yes  Screening Tests Health Maintenance  Topic Date Due   TETANUS/TDAP  Never done   COVID-19 Vaccine (3 - Moderna risk series) 11/21/2020 (Originally 04/30/2019)   DEXA SCAN  11/05/2021 (Originally 03/13/2014)   COLONOSCOPY (Pts 45-14yrs Insurance coverage will need to be confirmed)  10/02/2021   MAMMOGRAM  10/14/2021   Pneumonia Vaccine 68+ Years old  Completed   INFLUENZA VACCINE  Completed   Hepatitis C Screening  Completed   Zoster Vaccines- Shingrix  Completed   HPV VACCINES  Aged Out    Health Maintenance  Health Maintenance Due  Topic Date Due   TETANUS/TDAP  Never done    Colorectal cancer screening: Type of screening: Colonoscopy. Completed 10/03/2019. Repeat every 3 years  Mammogram status: Ordered 11//18/22. Pt provided with contact info and advised to call to schedule appt.    Patient declines bone density   Lung Cancer Screening: (Low Dose CT Chest recommended if Age 92-80 years, 30 pack-year currently smoking OR have quit w/in 15years.) does qualify.   Lung Cancer Screening Referral: No  Additional Screening:  Hepatitis  C Screening: does not qualify; Completed yes 11/03/2020  Vision Screening: Recommended annual ophthalmology exams for early detection of glaucoma and other disorders of the eye. Is the patient up to date with their annual eye exam?  No  Who is the provider or what is the name of the office in which the patient attends annual eye exams? [facility]  If pt is not established with a provider, would they like to be referred to a provider to establish care?  Patient already established .   Dental Screening: Recommended annual dental exams for proper oral hygiene  Community Resource Referral / Chronic Care Management: CRR required this visit?  No   CCM required this visit?  No      Plan:     I have personally reviewed and noted the following in the patient's chart:   Medical and social history Use of alcohol, tobacco or illicit drugs  Current medications and supplements including opioid prescriptions.  Functional ability and status Nutritional status Physical activity Advanced directives List of other physicians Hospitalizations, surgeries, and ER visits in previous 12 months Vitals Screenings to include cognitive, depression, and falls Referrals and appointments  In addition, I have reviewed and discussed with patient certain preventive protocols, quality metrics, and best practice recommendations. A written personalized care plan for preventive services as well as general preventive health recommendations were provided to patient.     Lacretia Nicks, [place]   11/05/2020   Nurse Notes:  Melissa English , Thank you for taking time to come for your Medicare Wellness Visit. I appreciate your ongoing commitment to your health goals.  Please review the following plan we discussed and let me know if I can assist you in the future.   These are the goals we discussed:  Goals   None     This is a list of the screening recommended for you and due dates:  Health Maintenance  Topic Date Due   Tetanus Vaccine  Never done   COVID-19 Vaccine (3 - Moderna risk series) 11/21/2020*   DEXA scan (bone density measurement)  11/05/2021*   Colon Cancer Screening  10/02/2021   Mammogram  10/14/2021   Pneumonia Vaccine  Completed   Flu Shot  Completed   Hepatitis C Screening: USPSTF Recommendation to screen - Ages 18-79 yo.  Completed   Zoster (Shingles) Vaccine  Completed   HPV Vaccine  Aged Out  *Topic was postponed. The date shown is not the original due date.     Time spent 25 min

## 2020-11-06 NOTE — Progress Notes (Signed)
I have reviewed this visit and agree with the documentation.   

## 2020-11-24 DIAGNOSIS — L578 Other skin changes due to chronic exposure to nonionizing radiation: Secondary | ICD-10-CM | POA: Diagnosis not present

## 2020-11-24 DIAGNOSIS — D0439 Carcinoma in situ of skin of other parts of face: Secondary | ICD-10-CM | POA: Diagnosis not present

## 2020-11-24 DIAGNOSIS — D492 Neoplasm of unspecified behavior of bone, soft tissue, and skin: Secondary | ICD-10-CM | POA: Diagnosis not present

## 2020-11-30 ENCOUNTER — Ambulatory Visit: Payer: Medicare Other | Admitting: Internal Medicine

## 2020-12-07 ENCOUNTER — Inpatient Hospital Stay: Admission: RE | Admit: 2020-12-07 | Payer: Medicare Other | Source: Ambulatory Visit

## 2020-12-13 ENCOUNTER — Other Ambulatory Visit: Payer: Self-pay

## 2020-12-13 DIAGNOSIS — F419 Anxiety disorder, unspecified: Secondary | ICD-10-CM

## 2020-12-13 MED ORDER — ALPRAZOLAM 0.5 MG PO TABS: 0.5000 mg | ORAL_TABLET | Freq: Two times a day (BID) | ORAL | 1 refills | Status: DC | PRN

## 2020-12-15 DIAGNOSIS — H524 Presbyopia: Secondary | ICD-10-CM | POA: Diagnosis not present

## 2020-12-15 DIAGNOSIS — H35033 Hypertensive retinopathy, bilateral: Secondary | ICD-10-CM | POA: Diagnosis not present

## 2021-01-24 ENCOUNTER — Other Ambulatory Visit: Payer: Self-pay | Admitting: *Deleted

## 2021-01-24 MED ORDER — LEVOTHYROXINE SODIUM 150 MCG PO TABS: 150.0000 ug | ORAL_TABLET | Freq: Every day | ORAL | 3 refills | Status: DC

## 2021-02-14 ENCOUNTER — Other Ambulatory Visit: Payer: Self-pay

## 2021-02-14 ENCOUNTER — Ambulatory Visit
Admission: RE | Admit: 2021-02-14 | Discharge: 2021-02-14 | Disposition: A | Discharge status: Home / Self Care | Payer: Medicare Other | Source: Ambulatory Visit | Attending: Internal Medicine | Admitting: Internal Medicine

## 2021-02-14 DIAGNOSIS — Z1231 Encounter for screening mammogram for malignant neoplasm of breast: Secondary | ICD-10-CM | POA: Insufficient documentation

## 2021-02-25 ENCOUNTER — Other Ambulatory Visit: Payer: Self-pay

## 2021-02-25 ENCOUNTER — Other Ambulatory Visit: Payer: Self-pay | Admitting: Internal Medicine

## 2021-02-25 MED ORDER — AZITHROMYCIN 250 MG PO TABS: ORAL_TABLET | ORAL | 0 refills | Status: AC

## 2021-03-01 ENCOUNTER — Encounter: Payer: Self-pay | Admitting: Internal Medicine

## 2021-03-01 ENCOUNTER — Ambulatory Visit (INDEPENDENT_AMBULATORY_CARE_PROVIDER_SITE_OTHER): Payer: Medicare Other | Admitting: Internal Medicine

## 2021-03-01 ENCOUNTER — Other Ambulatory Visit: Payer: Self-pay

## 2021-03-01 VITALS — BP 119/75 | HR 60 | Ht 67.0 in | Wt 203.7 lb

## 2021-03-01 DIAGNOSIS — R059 Cough, unspecified: Secondary | ICD-10-CM | POA: Diagnosis not present

## 2021-03-01 DIAGNOSIS — E063 Autoimmune thyroiditis: Secondary | ICD-10-CM

## 2021-03-01 DIAGNOSIS — E038 Other specified hypothyroidism: Secondary | ICD-10-CM

## 2021-03-01 DIAGNOSIS — J206 Acute bronchitis due to rhinovirus: Secondary | ICD-10-CM | POA: Diagnosis not present

## 2021-03-01 DIAGNOSIS — I10 Essential (primary) hypertension: Secondary | ICD-10-CM

## 2021-03-01 DIAGNOSIS — K219 Gastro-esophageal reflux disease without esophagitis: Secondary | ICD-10-CM

## 2021-03-01 DIAGNOSIS — Z6832 Body mass index (BMI) 32.0-32.9, adult: Secondary | ICD-10-CM

## 2021-03-01 DIAGNOSIS — E6609 Other obesity due to excess calories: Secondary | ICD-10-CM

## 2021-03-01 LAB — POC COVID19 BINAXNOW: SARS Coronavirus 2 Ag: NEGATIVE

## 2021-03-01 MED ORDER — LEVOFLOXACIN 500 MG PO TABS: 500.0000 mg | ORAL_TABLET | Freq: Every day | ORAL | 0 refills | Status: AC

## 2021-03-01 MED ORDER — METHYLPREDNISOLONE 4 MG PO TBPK: ORAL_TABLET | ORAL | 0 refills | Status: DC

## 2021-03-01 NOTE — Progress Notes (Signed)
Established Patient Office Visit  Subjective:  Patient ID: Melissa English, female    DOB: 1950/09/16  Age: 71 y.o. MRN: [id number]  CC: No chief complaint on file.   HPI  Melissa English presents for   URI  Past Medical History:  Diagnosis Date   Anxiety    Arthritis 2018   hands, back...getting injections for back   GERD (gastroesophageal reflux disease)    HLD (hyperlipidemia)    HTN (hypertension)    Hypothyroidism    Sleep apnea    uses cpap    Past Surgical History:  Procedure Laterality Date   ABDOMINAL HYSTERECTOMY     BACK SURGERY  11/05/2017   CARPAL TUNNEL RELEASE Right 2008   Dr. Kennith Center   CARPAL TUNNEL RELEASE Left 11/01/2016   Procedure: CARPAL TUNNEL RELEASE;  Surgeon: Deeann Saint, MD;  Location: [facility];  Service: Orthopedics;  Laterality: Left;   CHOLECYSTECTOMY     COLONOSCOPY WITH PROPOFOL N/A 10/03/2019   Procedure: COLONOSCOPY WITH PROPOFOL;  Surgeon: Pasty Spillers, MD;  Location: [facility];  Service: Endoscopy;  Laterality: N/A;   LUMBAR DISC SURGERY  1998   L4 - L5.  no metal in back   TOTAL VAGINAL HYSTERECTOMY  1998    Family History  Problem Relation Age of Onset   Breast cancer Neg Hx     Social History   Socioeconomic History   Marital status: Married    Spouse name: Not on file   Number of children: 3   Years of education: Not on file   Highest education level: Not on file  Occupational History   Occupation: housewife  Tobacco Use   Smoking status: Never   Smokeless tobacco: Never  Vaping Use   Vaping Use: Never used  Substance and Sexual Activity   Alcohol use: Yes    Comment: occasional wine or beer   Drug use: No   Sexual activity: Not on file  Other Topics Concern   Not on file  Social History Narrative   Not on file   Social Determinants of Health   Financial Resource Strain: Low Risk    Difficulty of Paying Living Expenses: Not hard at all  Food Insecurity: No Food Insecurity   Worried About  Programme researcher, broadcasting/film/video in the Last Year: Never true   Ran Out of Food in the Last Year: Never true  Transportation Needs: No Transportation Needs   Lack of Transportation (Medical): No   Lack of Transportation (Non-Medical): No  Physical Activity: Insufficiently Active   Days of Exercise per Week: 2 days   Minutes of Exercise per Session: 60 min  Stress: No Stress Concern Present   Feeling of Stress : Not at all  Social Connections: Socially Integrated   Frequency of Communication with Friends and Family: More than three times a week   Frequency of Social Gatherings with Friends and Family: More than three times a week   Attends Religious Services: More than 4 times per year   Active Member of [employer] or Organizations: Yes   Attends Engineer, structural: More than 4 times per year   Marital Status: Married  Catering manager Violence: Not At Risk   Fear of Current or Ex-Partner: No   Emotionally Abused: No   Physically Abused: No   Sexually Abused: No     Current Outpatient Medications:    acetaminophen (TYLENOL) 650 MG CR tablet, 1 tab as needed for pain, 2- 3 times/days, 90  days, Disp: , Rfl:    albuterol (PROVENTIL) (2.5 MG/3ML) 0.083% nebulizer solution, Take 3 mLs (2.5 mg total) by nebulization every 6 (six) hours as needed for wheezing or shortness of breath., Disp: 150 mL, Rfl: 1   ALPRAZolam (XANAX) 0.5 MG tablet, Take 1 tablet (0.5 mg total) by mouth 2 (two) times daily as needed for anxiety., Disp: 180 tablet, Rfl: 1   aspirin 81 MG tablet, Take 81 mg by mouth daily., Disp: , Rfl:    atorvastatin (LIPITOR) 20 MG tablet, TAKE 1 TABLET BY MOUTH  DAILY, Disp: 90 tablet, Rfl: 3   Calcium-Magnesium-Zinc 333-133-5 MG TABS, Take by mouth daily. , Disp: , Rfl:    famotidine (PEPCID) 20 MG tablet, TAKE 1 TABLET BY MOUTH  DAILY, Disp: 90 tablet, Rfl: 1   FLUoxetine (PROZAC) 20 MG capsule, Take 1 capsule (20 mg total) by mouth daily., Disp: 90 capsule, Rfl: 3   gabapentin  (NEURONTIN) 300 MG capsule, , Disp: , Rfl:    levofloxacin (LEVAQUIN) 500 MG tablet, Take 1 tablet (500 mg total) by mouth daily for 7 days., Disp: 7 tablet, Rfl: 0   levothyroxine (SYNTHROID) 150 MCG tablet, Take 1 tablet (150 mcg total) by mouth daily., Disp: 90 tablet, Rfl: 3   lisinopril (ZESTRIL) 10 MG tablet, TAKE 1 TABLET BY MOUTH  DAILY, Disp: 90 tablet, Rfl: 3   methylPREDNISolone (MEDROL DOSEPAK) 4 MG TBPK tablet, USE AS DIRECTED, Disp: 21 tablet, Rfl: 0   methylPREDNISolone (MEDROL DOSEPAK) 4 MG TBPK tablet, As directed, Disp: 21 each, Rfl: 0   omeprazole (PRILOSEC) 20 MG capsule, TAKE 1 CAPSULE BY MOUTH  DAILY, Disp: 90 capsule, Rfl: 3   Allergies  Allergen Reactions   Codeine Nausea And Vomiting and Other (See Comments)    Severe n & v Severe n & v   Sulfa Antibiotics Nausea And Vomiting    Occurred a long time ago so patient is unsure of reaction   Other Other (See Comments)   Sulfasalazine    Prednisone Other (See Comments)    Could not sleep and felt nervous.    ROS Review of Systems  Constitutional: Negative.   HENT:  Positive for postnasal drip, sinus pain and sore throat.   Eyes: Negative.   Respiratory: Negative.    Cardiovascular: Negative.   Gastrointestinal: Negative.   Endocrine: Negative.   Genitourinary: Negative.   Musculoskeletal: Negative.   Skin: Negative.   Allergic/Immunologic: Negative.   Neurological: Negative.   Hematological: Negative.   Psychiatric/Behavioral: Negative.    All other systems reviewed and are negative.    Objective:    Physical Exam Vitals reviewed.  Constitutional:      Appearance: Normal appearance.  HENT:     Mouth/Throat:     Mouth: Mucous membranes are moist.     Pharynx: Oropharyngeal exudate and posterior oropharyngeal erythema present.  Eyes:     Pupils: Pupils are equal, round, and reactive to light.  Neck:     Vascular: No carotid bruit.  Cardiovascular:     Rate and Rhythm: Normal rate and regular  rhythm.     Pulses: Normal pulses.     Heart sounds: Normal heart sounds.  Pulmonary:     Effort: Pulmonary effort is normal.     Breath sounds: Normal breath sounds.  Abdominal:     General: Bowel sounds are normal.     Palpations: Abdomen is soft. There is no hepatomegaly, splenomegaly or mass.     Tenderness: There is no  abdominal tenderness.     Hernia: No hernia is present.  Musculoskeletal:        General: No tenderness.     Cervical back: Neck supple.     Right lower leg: No edema.     Left lower leg: No edema.  Skin:    Findings: No rash.  Neurological:     Mental Status: She is alert and oriented to person, place, and time.     Motor: No weakness.  Psychiatric:        Mood and Affect: Mood and affect normal.        Behavior: Behavior normal.    BP 119/75    Pulse 60    Ht $R'5\' 7"'Xr$  (1.702 m)    Wt 203 lb 11.2 oz (92.4 kg)    BMI 31.90 kg/m  Wt Readings from Last 3 Encounters:  03/01/21 203 lb 11.2 oz (92.4 kg)  11/03/20 201 lb 1.6 oz (91.2 kg)  06/23/20 207 lb (93.9 kg)     Health Maintenance Due  Topic Date Due   TETANUS/TDAP  Never done   COVID-19 Vaccine (3 - Moderna risk series) 04/30/2019    There are no preventive care reminders to display for this patient.  Lab Results  Component Value Date   TSH <0.01 (L) 11/03/2020   Lab Results  Component Value Date   WBC 7.3 11/03/2020   HGB 12.4 11/03/2020   HCT 38.8 11/03/2020   MCV 94.2 11/03/2020   PLT 174 11/03/2020   Lab Results  Component Value Date   NA 139 11/03/2020   K 4.2 11/03/2020   CO2 20 11/03/2020   GLUCOSE 110 (H) 11/03/2020   BUN 17 11/03/2020   CREATININE 0.75 11/03/2020   BILITOT 0.7 11/03/2020   ALKPHOS 142 (H) 05/03/2020   AST 15 11/03/2020   ALT 15 11/03/2020   PROT 6.6 11/03/2020   ALBUMIN 4.5 05/03/2020   CALCIUM 8.7 11/03/2020   ANIONGAP 3 (L) 09/16/2012   EGFR 85 11/03/2020   Lab Results  Component Value Date   CHOL 162 11/03/2020   Lab Results  Component Value  Date   HDL 51 11/03/2020   Lab Results  Component Value Date   LDLCALC 89 11/03/2020   Lab Results  Component Value Date   TRIG 120 11/03/2020   Lab Results  Component Value Date   CHOLHDL 3.2 11/03/2020   No results found for: HGBA1C    Assessment & Plan:   Problem List Items Addressed This Visit       Cardiovascular and Mediastinum   Hypertension     Patient denies any chest pain or shortness of breath there is no history of palpitation or paroxysmal nocturnal dyspnea   patient was advised to follow low-salt low-cholesterol diet    ideally I want to keep systolic blood pressure below 130 mmHg, patient was asked to check blood pressure one times a week and give me a report on that.  Patient will be follow-up in 3 months  or earlier as needed, patient will call me back for any change in the cardiovascular symptoms Patient was advised to buy a book from local bookstore concerning blood pressure and read several chapters  every day.  This will be supplemented by some of the material we will give him from the office.  Patient should also utilize other resources like YouTube and Internet to learn more about the blood pressure and the diet.        Digestive  Gastroesophageal reflux disease without esophagitis    - The patient's GERD is stable on medication.  - Instructed the patient to avoid eating spicy and acidic foods, as well as foods high in fat. - Instructed the patient to avoid eating large meals or meals 2-3 hours prior to sleeping.        Endocrine   Hypothyroidism due to Hashimoto's thyroiditis    Needs regular follow-up        Other   Class 1 obesity due to excess calories without serious comorbidity with body mass index (BMI) of 32.0 to 32.9 in adult    - I encouraged the patient to lose weight.  - I educated them on making healthy dietary choices including eating more fruits and vegetables and less fried foods. - I encouraged the patient to exercise more,  and educated on the benefits of exercise including weight loss, diabetes prevention, and hypertension prevention.   Dietary counseling with a registered dietician  Referral to a weight management support group (e.g. Weight Watchers, Overeaters Anonymous)  If your BMI is greater than 29 or you have gained more than 15 pounds you should work on weight loss.  Attend a healthy cooking class       Other Visit Diagnoses     Cough, unspecified type    -  Primary   Relevant Orders   POC COVID-19 (Completed)   Acute bronchitis due to Rhinovirus           Meds ordered this encounter  Medications   methylPREDNISolone (MEDROL DOSEPAK) 4 MG TBPK tablet    Sig: As directed    Dispense:  21 each    Refill:  0   levofloxacin (LEVAQUIN) 500 MG tablet    Sig: Take 1 tablet (500 mg total) by mouth daily for 7 days.    Dispense:  7 tablet    Refill:  0    Follow-up: No follow-ups on file.    Cletis Athens, MD

## 2021-03-03 NOTE — Assessment & Plan Note (Signed)
Needs regular follow-up 

## 2021-03-03 NOTE — Assessment & Plan Note (Signed)
-   The patient's GERD is stable on medication.  - Instructed the patient to avoid eating spicy and acidic foods, as well as foods high in fat. - Instructed the patient to avoid eating large meals or meals 2-3 hours prior to sleeping. 

## 2021-03-03 NOTE — Assessment & Plan Note (Signed)

## 2021-03-03 NOTE — Assessment & Plan Note (Signed)

## 2021-04-01 ENCOUNTER — Other Ambulatory Visit: Payer: Self-pay | Admitting: Internal Medicine

## 2021-04-21 ENCOUNTER — Other Ambulatory Visit: Payer: Self-pay | Admitting: Internal Medicine

## 2021-05-09 NOTE — Progress Notes (Signed)
?Cardiology Office Note:   ? ?Date:  05/10/2021  ? ?ID:  Melissa English, DOB 23-Jun-1950, MRN [id number] ? ?PCP:  Cletis Athens, MD  ?[place] Cardiologist:  Kathlyn Sacramento, MD  ?[facility] Electrophysiologist:  None  ? ?Referring MD: Cletis Athens, MD  ? ?Chief Complaint: Overdue follow-up for DOE and chest pain ? ?History of Present Illness:   ? ?Melissa English is a 71 y.o. female with a hx of HTN, HLD, OSA on CPAP, hypothyroidism who presents for follow-up.  ? ?Evaluated in 2018 for  atypical chest pain and exertional dyspnea with negative cardiac work-up including Lexiscan Myoview and echocardiogram.  She was seen by pulmonary and was suspected of having exercise-induced asthma that subsequently improved. ? ?Last seen via televisit 07/08/28 and reported improved exertional dyspnea. No further work-up was pursued. ? ?Today, the patient reports chest pain and DOE. Last week she had left sided sharp chest pain. NO associated SOB. Only lasted a second. Over the weekend she had headaches and left arm was hurting. Noted some tingling and numbness in there arm. Also noted progressive DOE for the last year. She is compliant with CPAP. No lung disease or smoking history. No Lle, orthopnea, pnd.  ? ?Past Medical History:  ?Diagnosis Date  ? Anxiety   ? Arthritis 2018  ? hands, back...getting injections for back  ? GERD (gastroesophageal reflux disease)   ? HLD (hyperlipidemia)   ? HTN (hypertension)   ? Hypothyroidism   ? Sleep apnea   ? uses cpap  ? ? ?Past Surgical History:  ?Procedure Laterality Date  ? ABDOMINAL HYSTERECTOMY    ? BACK SURGERY  11/05/2017  ? CARPAL TUNNEL RELEASE Right 2008  ? Dr. Derrel Nip  ? CARPAL TUNNEL RELEASE Left 11/01/2016  ? Procedure: CARPAL TUNNEL RELEASE;  Surgeon: Earnestine Leys, MD;  Location: [facility];  Service: Orthopedics;  Laterality: Left;  ? CHOLECYSTECTOMY    ? COLONOSCOPY WITH PROPOFOL N/A 10/03/2019  ? Procedure: COLONOSCOPY WITH PROPOFOL;  Surgeon: Virgel Manifold, MD;   Location: [facility];  Service: Endoscopy;  Laterality: N/A;  ? [place]  ? L4 - L5.  no metal in back  ? TOTAL VAGINAL HYSTERECTOMY  1998  ? ? ?Current Medications: ?Current Meds  ?Medication Sig  ? acetaminophen (TYLENOL) 650 MG CR tablet 1 tab as needed for pain, 2- 3 times/days, 90 days  ? ALPRAZolam (XANAX) 0.5 MG tablet Take 1 tablet (0.5 mg total) by mouth 2 (two) times daily as needed for anxiety.  ? aspirin 81 MG tablet Take 81 mg by mouth daily.  ? atorvastatin (LIPITOR) 20 MG tablet TAKE 1 TABLET BY MOUTH  DAILY  ? FLUoxetine (PROZAC) 20 MG capsule TAKE 1 CAPSULE BY MOUTH  DAILY  ? levothyroxine (SYNTHROID) 150 MCG tablet Take 1 tablet (150 mcg total) by mouth daily.  ? lisinopril (ZESTRIL) 10 MG tablet TAKE 1 TABLET BY MOUTH  DAILY  ? omeprazole (PRILOSEC) 20 MG capsule TAKE 1 CAPSULE BY MOUTH  DAILY  ?  ? ?Allergies:   Codeine, Sulfa antibiotics, Other, Sulfasalazine, and Prednisone  ? ?Social History  ? ?Socioeconomic History  ? Marital status: Married  ?  Spouse name: Not on file  ? Number of children: 3  ? Years of education: Not on file  ? Highest education level: Not on file  ?Occupational History  ? Occupation: housewife  ?Tobacco Use  ? Smoking status: Never  ? Smokeless tobacco: Never  ?Vaping Use  ?  Vaping Use: Never used  ?Substance and Sexual Activity  ? Alcohol use: Yes  ?  Comment: occasional wine or beer  ? Drug use: No  ? Sexual activity: Not on file  ?Other Topics Concern  ? Not on file  ?Social History Narrative  ? Not on file  ? ?Social Determinants of Health  ? ?Financial Resource Strain: Low Risk   ? Difficulty of Paying Living Expenses: Not hard at all  ?Food Insecurity: No Food Insecurity  ? Worried About Charity fundraiser in the Last Year: Never true  ? Ran Out of Food in the Last Year: Never true  ?Transportation Needs: No Transportation Needs  ? Lack of Transportation (Medical): No  ? Lack of Transportation (Non-Medical): No  ?Physical Activity:  Insufficiently Active  ? Days of Exercise per Week: 2 days  ? Minutes of Exercise per Session: 60 min  ?Stress: No Stress Concern Present  ? Feeling of Stress : Not at all  ?Social Connections: Socially Integrated  ? Frequency of Communication with Friends and Family: More than three times a week  ? Frequency of Social Gatherings with Friends and Family: More than three times a week  ? Attends Religious Services: More than 4 times per year  ? Active Member of Clubs or Organizations: Yes  ? Attends Archivist Meetings: More than 4 times per year  ? Marital Status: Married  ?  ? ?Family History: ?The patient's family history is negative for Breast cancer. ? ?ROS:   ?Please see the history of present illness.    ? All other systems reviewed and are negative. ? ?EKGs/Labs/Other Studies Reviewed:   ? ?The following studies were reviewed today: ? ?Echo 2018 ?Study Conclusions  ? ?- Left ventricle: The cavity size was normal. Wall thickness was  ?  normal. Systolic function was normal. The estimated ejection  ?  fraction was in the range of 55% to 60%. Wall motion was normal;  ?  there were no regional wall motion abnormalities. Left  ?  ventricular diastolic function parameters were normal.  ? ?Impressions:  ? ?- Normal study.  ? ?Myoview Lexiscan 2018 ?Pharmacological myocardial perfusion imaging study with no significant  ischemia ?Normal wall motion, EF estimated at 45% ?No EKG changes concerning for ischemia at peak stress or in recovery. ?Low risk scan ?  ?  ?Signed, ?Esmond Plants, MD, Ph.D ?[facility] ? ? ?EKG:  EKG is  ordered today.  The ekg ordered today demonstrates NSR, 55bpm, no ST/T wave changes ? ?Recent Labs: ?11/03/2020: ALT 15; BUN 17; Creat 0.75; Hemoglobin 12.4; Platelets 174; Potassium 4.2; Sodium 139; TSH <0.01  ?Recent Lipid Panel ?   ?Component Value Date/Time  ? CHOL 162 11/03/2020 1222  ? CHOL 193 08/23/2011 0743  ? TRIG 120 11/03/2020 1222  ? TRIG 121 08/23/2011 0743  ? HDL 51  11/03/2020 1222  ? HDL 55 08/23/2011 0743  ? CHOLHDL 3.2 11/03/2020 1222  ? VLDL 24 08/23/2011 0743  ? [place] 89 11/03/2020 1222  ? LDLCALC 114 (H) 08/23/2011 0743  ? ? ? ?Physical Exam:   ? ?VS:  BP 110/72 (BP Location: Left Arm, Patient Position: Sitting, Cuff Size: Large)   Pulse (!) 55   Ht '5\' 6"'$  (1.676 m)   Wt 199 lb (90.3 kg)   SpO2 95%   BMI 32.12 kg/m?    ? ?Wt Readings from Last 3 Encounters:  ?05/10/21 199 lb (90.3 kg)  ?03/01/21 203 lb 11.2 oz (92.4  kg)  ?11/03/20 201 lb 1.6 oz (91.2 kg)  ?  ? ?GEN:  Well nourished, well developed in no acute distress ?HEENT: Normal ?NECK: No JVD; No carotid bruits ?LYMPHATICS: No lymphadenopathy ?CARDIAC: bradycardia, RR, no murmurs, rubs, gallops ?RESPIRATORY:  Clear to auscultation without rales, wheezing or rhonchi  ?ABDOMEN: Soft, non-tender, non-distended ?MUSCULOSKELETAL:  No edema; No deformity  ?SKIN: Warm and dry ?NEUROLOGIC:  Alert and oriented x 3 ?PSYCHIATRIC:  Normal affect  ? ?ASSESSMENT:   ? ?1. DOE (dyspnea on exertion)   ?2. Chest pain, unspecified type   ?3. Essential hypertension   ?4. Hyperlipidemia, mixed   ? ?PLAN:   ? ?In order of problems listed above: ? ?DOE ?Patient reports preogressive DOE. Echo in 2018 was normal. I will check a BMET, TSH and CBC. She is euvolemic on exam. I will repeat an echo. ? ?Atypical chest pain ?Reports an episode of atypical chest pain last week. Prior Myoview in 2018 was low risk with normal wall motion. EKG shows SB with no ischemic changes. I will order a cardiac CTA. She is on Aspirin and Lipitor.  ? ?HTN ?BP good today, continue lisinopril.  ? ?HLD ?Conitnue Lipitor '20mg'$  daily. LDL 89, HDL 51, total chol 162, TG 120 10/2020.  ? ?Disposition: Follow up in 2 month(s) with MD/APP  ? ? ?Signed, ?Clorinda Wyble Ninfa Meeker, PA-C  ?05/10/2021 9:35 AM    ?[facility]  ?

## 2021-05-10 ENCOUNTER — Encounter: Payer: Self-pay | Admitting: Medical

## 2021-05-10 ENCOUNTER — Other Ambulatory Visit
Admission: RE | Admit: 2021-05-10 | Discharge: 2021-05-10 | Disposition: A | Discharge status: Home / Self Care | Payer: Medicare Other | Attending: Medical | Admitting: Medical

## 2021-05-10 ENCOUNTER — Ambulatory Visit: Payer: Medicare Other | Admitting: Medical

## 2021-05-10 VITALS — BP 110/72 | HR 55 | Ht 66.0 in | Wt 199.0 lb

## 2021-05-10 DIAGNOSIS — E782 Mixed hyperlipidemia: Secondary | ICD-10-CM

## 2021-05-10 DIAGNOSIS — I10 Essential (primary) hypertension: Secondary | ICD-10-CM | POA: Diagnosis not present

## 2021-05-10 DIAGNOSIS — R079 Chest pain, unspecified: Secondary | ICD-10-CM | POA: Diagnosis not present

## 2021-05-10 DIAGNOSIS — R0609 Other forms of dyspnea: Secondary | ICD-10-CM | POA: Insufficient documentation

## 2021-05-10 LAB — BASIC METABOLIC PANEL
Anion gap: 7 (ref 5–15)
BUN: 16 mg/dL (ref 8–23)
CO2: 25 mmol/L (ref 22–32)
Calcium: 9.3 mg/dL (ref 8.9–10.3)
Chloride: 106 mmol/L (ref 98–111)
Creatinine, Ser: 0.87 mg/dL (ref 0.44–1.00)
GFR, Estimated: >60 mL/min (ref >60)
Glucose, Bld: 131 mg/dL — ABNORMAL HIGH (ref 70–99)
Potassium: 4.6 mmol/L (ref 3.5–5.1)
Sodium: 138 mmol/L (ref 135–145)

## 2021-05-10 LAB — CBC
HCT: 41.4 % (ref 36.0–46.0)
Hemoglobin: 13.2 g/dL (ref 12.0–15.0)
MCH: 30.6 pg (ref 26.0–34.0)
MCHC: 31.9 g/dL (ref 30.0–36.0)
MCV: 95.8 fL (ref 80.0–100.0)
Platelets: 163 10*3/uL (ref 150–400)
RBC: 4.32 MIL/uL (ref 3.87–5.11)
RDW: 12.8 % (ref 11.5–15.5)
WBC: 7.2 10*3/uL (ref 4.0–10.5)
nRBC: 0 % (ref 0.0–0.2)

## 2021-05-10 LAB — TSH: TSH: 0.018 u[IU]/mL — ABNORMAL LOW (ref 0.350–4.500)

## 2021-05-10 NOTE — Patient Instructions (Signed)
Medication Instructions:  ? ?Your physician recommends that you continue on your current medications as directed. Please refer to the Current Medication list given to you today. ? ?*If you need a refill on your cardiac medications before your next appointment, please call your pharmacy* ? ? ?Lab Work: ? ?Today at the medical mall at [facility]: BMET, CBC, TSH ? ?-  Please go to the [facility].  ?-  You will check in at the front desk to the right as you walk into the atrium.  ? ?If you have labs (blood work) drawn today and your tests are completely normal, you will receive your results only by: ?MyChart Message (if you have MyChart) OR ?A paper copy in the mail ?If you have any lab test that is abnormal or we need to change your treatment, we will call you to review the results. ? ? ?Testing/Procedures: ? ?1) Your physician has requested that you have an echocardiogram. Echocardiography is a painless test that uses sound waves to create images of your heart. It provides your doctor with information about the size and shape of your heart and how well your heart?s chambers and valves are working. This procedure takes approximately one hour. There are no restrictions for this procedure. ? ? ?2) Your cardiac CT is scheduled Monday 05/16/21 at 8:00 AM at the below location: ? ? ?[address] ?[address] ?[address] ?[place], [place] [address] ?[phone] ? ?[place] arrive 15 mins early for check-in and test prep. ? ?Please follow these instructions carefully (unless otherwise directed): ? ? ?On the Night Before the Test: ? ?Be sure to Drink plenty of water. ?Do not consume any caffeinated/decaffeinated beverages or chocolate 12 hours prior to your test. ?Do not take any antihistamines 12 hours prior to your test. ? ?On the Day of the Test: ? ?Drink plenty of water until 1 hour prior to the test. ?Do not eat any food 4 hours prior to the test. ?You may take your regular medications  prior to the test.  ?FEMALES- please wear underwire-free bra if available, avoid dresses & tight clothing ? ? ?     ?After the Test: ? ?Drink plenty of water. ?After receiving IV contrast, you may experience a mild flushed feeling. This is normal. ?On occasion, you may experience a mild rash up to 24 hours after the test. This is not dangerous. If this occurs, you can take Benadryl 25 mg and increase your fluid intake. ?If you experience trouble breathing, this can be serious. If it is severe call 911 IMMEDIATELY. If it is mild, please call our office. ?If you take any of these medications: Glipizide/Metformin, Avandament, Glucavance, please do not take 48 hours after completing test unless otherwise instructed. ? ? ?For non-scheduling related questions, please contact the cardiac imaging nurse navigator should you have any questions/concerns: ?Marchia Bond, Cardiac Imaging Nurse Navigator ?Gordy Clement, Cardiac Imaging Nurse Navigator ?[facility] ?Direct Office Dial: [phone]  ? ?For scheduling needs, including cancellations and rescheduling, please call [place], [phone]. ? ? ?Follow-Up: ?At [facility], you and your health needs are our priority.  As part of our continuing mission to provide you with exceptional heart care, we have created designated Provider Care Teams.  These Care Teams include your primary Cardiologist (physician) and Advanced Practice Providers (APPs -  Physician Assistants and Nurse Practitioners) who all work together to provide you with the care you need, when you need it. ? ?We  recommend signing up for the patient portal called "MyChart".  Sign up information is provided on this After Visit Summary.  MyChart is used to connect with patients for Virtual Visits (Telemedicine).  Patients are able to view lab/test results, encounter notes, upcoming appointments, etc.  Non-urgent messages can be sent to your provider as well.   ?To learn more about  what you can do with MyChart, go to [email or web address].   ? ?Your next appointment:   ?2 month(s) ? ?The format for your next appointment:   ?In Person ? ?Provider:   ?You may see Dr. Fletcher Anon or one of the following Advanced Practice Providers on your designated Care Team:   ? ?Cadence Kathlen Mody, PA-C{ ? ? ?Important Information About Sugar ? ? ? ? ? ? ?

## 2021-05-13 ENCOUNTER — Telehealth (HOSPITAL_COMMUNITY): Payer: Self-pay | Admitting: *Deleted

## 2021-05-13 NOTE — Telephone Encounter (Signed)
Reaching out to patient to offer assistance regarding upcoming cardiac imaging study; pt verbalizes understanding of appt date/time, parking situation and where to check in, pre-test NPO status  and verified current allergies; name and call back number provided for further questions should they arise ? ?Lilley Hubble RN Navigator Cardiac Imaging ?[facility] ?[phone] office ?[phone] cell ? ?

## 2021-05-16 ENCOUNTER — Other Ambulatory Visit: Payer: Self-pay | Admitting: Medical

## 2021-05-16 ENCOUNTER — Encounter: Payer: Self-pay | Admitting: Cardiovascular Disease

## 2021-05-16 ENCOUNTER — Ambulatory Visit
Admission: RE | Admit: 2021-05-16 | Discharge: 2021-05-16 | Disposition: A | Discharge status: Home / Self Care | Payer: Medicare Other | Source: Ambulatory Visit | Attending: Medical | Admitting: Medical

## 2021-05-16 DIAGNOSIS — R931 Abnormal findings on diagnostic imaging of heart and coronary circulation: Secondary | ICD-10-CM | POA: Insufficient documentation

## 2021-05-16 DIAGNOSIS — K449 Diaphragmatic hernia without obstruction or gangrene: Secondary | ICD-10-CM | POA: Insufficient documentation

## 2021-05-16 DIAGNOSIS — I2510 Atherosclerotic heart disease of native coronary artery without angina pectoris: Secondary | ICD-10-CM

## 2021-05-16 DIAGNOSIS — R079 Chest pain, unspecified: Secondary | ICD-10-CM | POA: Insufficient documentation

## 2021-05-16 MED ORDER — NITROGLYCERIN 0.4 MG SL SUBL: 0.4000 mg | SUBLINGUAL_TABLET | SUBLINGUAL | Status: DC | PRN

## 2021-05-16 MED ORDER — NITROGLYCERIN 0.4 MG SL SUBL
0.8000 mg | SUBLINGUAL_TABLET | Freq: Once | SUBLINGUAL | Status: AC
  Administered 2021-05-16: 0.8 mg via SUBLINGUAL

## 2021-05-16 MED ORDER — IOHEXOL 350 MG/ML SOLN
87.0000 mL | Freq: Once | INTRAVENOUS | Status: AC | PRN
  Administered 2021-05-16: 87 mL via INTRAVENOUS

## 2021-05-16 NOTE — Progress Notes (Signed)
Patient tolerated procedure well. Ambulate w/o difficulty. Denies any lightheadedness or being dizzy. Pt denies any pain at this time. Sitting in chair, drinking water provided. P is encouraged to drink additional water throughout the day and reason explained to patient. Patient verbalized understanding and all questions answered. ABC intact. No further needs at this time. Discharge from procedure area w/o issues.  °

## 2021-05-17 ENCOUNTER — Telehealth: Payer: Self-pay

## 2021-05-17 DIAGNOSIS — I2510 Atherosclerotic heart disease of native coronary artery without angina pectoris: Secondary | ICD-10-CM | POA: Diagnosis not present

## 2021-05-17 DIAGNOSIS — R931 Abnormal findings on diagnostic imaging of heart and coronary circulation: Secondary | ICD-10-CM

## 2021-05-17 NOTE — Telephone Encounter (Signed)
Patient made aware of Cardiac CTA results.  ?Appt scheduled for 05/18/21 @ 10;05 am with Ignacia Bayley, NP. ? ?Patient verbalized understanding and voiced appreciation for the call. ?

## 2021-05-17 NOTE — Telephone Encounter (Signed)
-----   Message from K[place], PA-C sent at 05/17/2021 11:56 AM EDT ----- ?Cardiac CT showed CAD, however it was sent for further testing and it showed no significant stenosis. However, if patient is still having symptoms, may need to consider cardiac cath. I think it would be best to come in for a visit to discuss cardiac CT and symptoms.  ?

## 2021-05-18 ENCOUNTER — Ambulatory Visit: Payer: Medicare Other | Admitting: Nurse Practitioner

## 2021-05-18 ENCOUNTER — Encounter: Payer: Self-pay | Admitting: Nurse Practitioner

## 2021-05-18 VITALS — BP 110/60 | HR 66 | Ht 66.0 in | Wt 201.0 lb

## 2021-05-18 DIAGNOSIS — E785 Hyperlipidemia, unspecified: Secondary | ICD-10-CM

## 2021-05-18 DIAGNOSIS — I10 Essential (primary) hypertension: Secondary | ICD-10-CM | POA: Diagnosis not present

## 2021-05-18 DIAGNOSIS — G4733 Obstructive sleep apnea (adult) (pediatric): Secondary | ICD-10-CM

## 2021-05-18 DIAGNOSIS — I25119 Atherosclerotic heart disease of native coronary artery with unspecified angina pectoris: Secondary | ICD-10-CM | POA: Diagnosis not present

## 2021-05-18 DIAGNOSIS — R072 Precordial pain: Secondary | ICD-10-CM

## 2021-05-18 DIAGNOSIS — R0609 Other forms of dyspnea: Secondary | ICD-10-CM | POA: Diagnosis not present

## 2021-05-18 MED ORDER — ATORVASTATIN CALCIUM 40 MG PO TABS: 40.0000 mg | ORAL_TABLET | Freq: Every day | ORAL | 3 refills | Status: DC

## 2021-05-18 MED ORDER — ISOSORBIDE MONONITRATE ER 30 MG PO TB24: 15.0000 mg | ORAL_TABLET | Freq: Every day | ORAL | 3 refills | Status: DC

## 2021-05-18 MED ORDER — NITROGLYCERIN 0.4 MG SL SUBL: 0.4000 mg | SUBLINGUAL_TABLET | SUBLINGUAL | 3 refills | Status: AC | PRN

## 2021-05-18 NOTE — Progress Notes (Signed)
? ? ?Office Visit  ?  ?Patient Name: Melissa English ?Date of Encounter: 05/18/2021 ? ?Primary Care Provider:  Cletis Athens, MD ?Primary Cardiologist:  Kathlyn Sacramento, MD ? ?Chief Complaint  ?  ?71 year old female with a history of hypertension, hyperlipidemia, obstructive sleep apnea on CPAP, hypothyroidism, and chest pain, who presents for follow-up after recent coronary CT angiogram. ? ?Past Medical History  ?  ?Past Medical History:  ?Diagnosis Date  ? Anxiety   ? Arthritis 2018  ? hands, back...getting injections for back  ? CAD (coronary artery disease)   ? a. 08/2016 MV: EF 45%, no ischemia (EF 55-60% by echo); b. 05/2021 Cor CTA: LM nl, LAD >70p (FFRct 0.85), LCX 60-69p (FFRct 0.96), RCA non-dom, nl (FFRct 0.88). Cor Ca2+ = 613 (92nd %'ile).  ? GERD (gastroesophageal reflux disease)   ? Hiatal hernia   ? a. 05/2021 Cor CTA: Hiatal hernia incidentally noted.  ? History of echocardiogram   ? a. 09/2016 Echo: EF 55-60%, no rmwa.  ? HLD (hyperlipidemia)   ? HTN (hypertension)   ? Hypothyroidism   ? Sleep apnea   ? uses cpap  ? ?Past Surgical History:  ?Procedure Laterality Date  ? ABDOMINAL HYSTERECTOMY    ? BACK SURGERY  11/05/2017  ? CARPAL TUNNEL RELEASE Right 2008  ? Dr. Derrel Nip  ? CARPAL TUNNEL RELEASE Left 11/01/2016  ? Procedure: CARPAL TUNNEL RELEASE;  Surgeon: Earnestine Leys, MD;  Location: [facility] ORS;  Service: Orthopedics;  Laterality: Left;  ? CHOLECYSTECTOMY    ? COLONOSCOPY WITH PROPOFOL N/A 10/03/2019  ? Procedure: COLONOSCOPY WITH PROPOFOL;  Surgeon: Virgel Manifold, MD;  Location: [facility] ENDOSCOPY;  Service: Endoscopy;  Laterality: N/A;  ? [place]  ? L4 - L5.  no metal in back  ? TOTAL VAGINAL HYSTERECTOMY  1998  ? ? ?Allergies ? ?Allergies  ?Allergen Reactions  ? Codeine Nausea And Vomiting and Other (See Comments)  ?  Severe n & v ?Severe n & v  ? Sulfa Antibiotics Nausea And Vomiting  ?  Occurred a long time ago so patient is unsure of reaction  ? Other Other (See Comments)  ?  Sulfasalazine   ? Prednisone Other (See Comments)  ?  Could not sleep and felt nervous.  ? ? ?History of Present Illness  ?  ?71 year old female with a history of hypertension, hyperlipidemia, obstructive sleep apnea on CPAP, hypothyroidism, and chest pain.  She previously underwent stress testing in August 2018, which showed no evidence of ischemia or infarct.  EF was recorded at 45% and follow-up echo showed an EF of 55 to 60%.  She was recently seen in cardiology clinic on May 2 with complaints of sharp left-sided chest pain and progressive dyspnea on exertion.  Coronary CT angiogram was undertaken and showed greater than 70% proximal LAD stenosis and a 60 to 69% proximal left circumflex stenosis.  Coronary calcium was elevated at 613 (92nd percentile).  Images were sent for FFR CT analysis, which returned normal (0.85 for the LAD, 0.96 for the left circumflex, 0.88 for the right coronary artery). ? ?Melissa English presents today to follow-up her CT.  Her daughter is present with her.  In discussing prior symptoms, chest pain was largely sharp and fleeting in nature, though she did have 1 episode of substernal chest tightness with left arm tingling and mild headache that eventually resolved spontaneously, prior to her last visit.  She has not had any recurrence of that type of symptom, though  she occasionally notes paresthesias in her left arm or left leg, which she most recently noted when lying with her granddaughter.  The symptoms resolved spontaneously.  She is not sure if changing position improved paresthesias.  Her biggest concern is a 1 year history of dyspnea on exertion.  She does not really think it is progressed, but has been persistent.  She does not routinely exercise but is very active on her yard noting that she is able to use a backpack leaf blower for 30 or more minutes without having to take a break.  She is also able to pull weeds and uses a weed [employer] for an hour or more prior to taking a break  related to fatigue or dyspnea.  She does not experience exertional chest tightness/pressure.  She denies palpitations, PND, orthopnea, dizziness, syncope, edema, or early satiety. ? ?Home Medications  ?  ?Current Outpatient Medications  ?Medication Sig Dispense Refill  ? acetaminophen (TYLENOL) 650 MG CR tablet 1 tab as needed for pain, 2- 3 times/days, 90 days    ? ALPRAZolam (XANAX) 0.5 MG tablet Take 1 tablet (0.5 mg total) by mouth 2 (two) times daily as needed for anxiety. 180 tablet 1  ? aspirin 81 MG tablet Take 81 mg by mouth daily.    ? atorvastatin (LIPITOR) 40 MG tablet Take 1 tablet (40 mg total) by mouth daily. 90 tablet 3  ? FLUoxetine (PROZAC) 20 MG capsule TAKE 1 CAPSULE BY MOUTH  DAILY 90 capsule 3  ? isosorbide mononitrate (IMDUR) 30 MG 24 hr tablet Take 0.5 tablets (15 mg total) by mouth daily. 45 tablet 3  ? levothyroxine (SYNTHROID) 150 MCG tablet Take 1 tablet (150 mcg total) by mouth daily. 90 tablet 3  ? lisinopril (ZESTRIL) 10 MG tablet TAKE 1 TABLET BY MOUTH  DAILY 90 tablet 3  ? nitroGLYCERIN (NITROSTAT) 0.4 MG SL tablet Place 1 tablet (0.4 mg total) under the tongue every 5 (five) minutes as needed for chest pain. 25 tablet 3  ? omeprazole (PRILOSEC) 20 MG capsule TAKE 1 CAPSULE BY MOUTH  DAILY 90 capsule 3  ? ?No current facility-administered medications for this visit.  ?  ? ?Review of Systems  ?  ?As above, occasional sharp and fleeting chest pain.  1 episode of chest tightness prior to her last visit.  Persistent dyspnea on exertion over the past year without significant progression.  She denies palpitations, PND, orthopnea, dizziness, syncope, edema, or early satiety.  All other systems reviewed and are otherwise negative except as noted above. ?  ? ?Physical Exam  ?  ?VS:  BP 110/60 (BP Location: Left Arm, Patient Position: Sitting, Cuff Size: Normal)   Pulse 66   Ht '5\' 6"'$  (1.676 m)   Wt 201 lb (91.2 kg)   SpO2 98%   BMI 32.44 kg/m?  , BMI Body mass index is 32.44 kg/m?. ?     ?GEN: Well nourished, well developed, in no acute distress. ?HEENT: normal. ?Neck: Supple, no JVD, carotid bruits, or masses. ?Cardiac: RRR, no murmurs, rubs, or gallops. No clubbing, cyanosis, edema.  Radials/PT 2+ and equal bilaterally.  ?Respiratory:  Respirations regular and unlabored, clear to auscultation bilaterally. ?GI: Soft, nontender, nondistended, BS + x 4. ?MS: no deformity or atrophy. ?Skin: warm and dry, no rash. ?Neuro:  Strength and sensation are intact. ?Psych: Normal affect. ? ?Accessory Clinical Findings  ?  ? ?Lab Results  ?Component Value Date  ? WBC 7.2 05/10/2021  ? HGB 13.2 05/10/2021  ?  HCT 41.4 05/10/2021  ? MCV 95.8 05/10/2021  ? PLT 163 05/10/2021  ? ?Lab Results  ?Component Value Date  ? CREATININE 0.87 05/10/2021  ? BUN 16 05/10/2021  ? NA 138 05/10/2021  ? K 4.6 05/10/2021  ? CL 106 05/10/2021  ? CO2 25 05/10/2021  ? ?Lab Results  ?Component Value Date  ? ALT 15 11/03/2020  ? AST 15 11/03/2020  ? GGT 20 05/03/2020  ? ALKPHOS 142 (H) 05/03/2020  ? BILITOT 0.7 11/03/2020  ? ?Lab Results  ?Component Value Date  ? CHOL 162 11/03/2020  ? HDL 51 11/03/2020  ? [place] 89 11/03/2020  ? TRIG 120 11/03/2020  ? CHOLHDL 3.2 11/03/2020  ?  ?Lab Results  ?Component Value Date  ? TSH 0.018 (L) 05/10/2021  ?  ? ?Assessment & Plan  ?  ?1.  Coronary artery disease/precordial chest pain: Patient was recently evaluated with complaints of 2 separate types of chest pain.  1 has been sharp and fleeting in nature while the other was a single episode of chest tightness associated with left arm discomfort and tingling.  Separately, she has also been experiencing dyspnea on exertion over the past year which has not progressed significantly.  Coronary CT angiogram was performed earlier this week and showed moderate to severe LAD and circumflex disease with normal FFRct in both locations.  We discussed her symptoms and CT findings in detail today.  Patient's and daughter's questions answered.  She is pending an  echo next week.  We agreed to pursue an initial course of medical therapy including aspirin, titration of statin, and low-dose long-acting nitrate therapy.  Await echo and plan early follow-up with consideration g

## 2021-05-18 NOTE — Patient Instructions (Signed)
Medication Instructions:  ?Your physician has recommended you make the following change in your medication:  ? ?INCREASE Atorvastatin to 40 mg once daily  ?START Isosorbide mononitrate 15 mg once daily  ? ?*If you need a refill on your cardiac medications before your next appointment, please call your pharmacy* ? ? ?Lab Work: ?None ? ?If you have labs (blood work) drawn today and your tests are completely normal, you will receive your results only by: ?MyChart Message (if you have MyChart) OR ?A paper copy in the mail ?If you have any lab test that is abnormal or we need to change your treatment, we will call you to review the results. ? ? ?Testing/Procedures: ?Keep scheduled echocardiogram.  ? ? ?Follow-Up: ?At [facility], you and your health needs are our priority.  As part of our continuing mission to provide you with exceptional heart care, we have created designated Provider Care Teams.  These Care Teams include your primary Cardiologist (physician) and Advanced Practice Providers (APPs -  Physician Assistants and Nurse Practitioners) who all work together to provide you with the care you need, when you need it. ? ? ?Your next appointment:   ?2 week(s) ? ?The format for your next appointment:   ?In Person ? ?Provider:   ?Kathlyn Sacramento, MD, Murray Hodgkins, NP, or Cadence Kathlen Mody, [place]  ? ? ? ? ? ?Important Information About Sugar ? ? ? ? ?  ?

## 2021-05-24 ENCOUNTER — Ambulatory Visit (INDEPENDENT_AMBULATORY_CARE_PROVIDER_SITE_OTHER): Payer: Medicare Other | Admitting: Internal Medicine

## 2021-05-24 ENCOUNTER — Encounter: Payer: Self-pay | Admitting: Internal Medicine

## 2021-05-24 VITALS — BP 126/76 | HR 57 | Ht 66.0 in | Wt 201.9 lb

## 2021-05-24 DIAGNOSIS — K219 Gastro-esophageal reflux disease without esophagitis: Secondary | ICD-10-CM

## 2021-05-24 DIAGNOSIS — J069 Acute upper respiratory infection, unspecified: Secondary | ICD-10-CM | POA: Diagnosis not present

## 2021-05-24 DIAGNOSIS — I10 Essential (primary) hypertension: Secondary | ICD-10-CM

## 2021-05-24 DIAGNOSIS — Z20822 Contact with and (suspected) exposure to covid-19: Secondary | ICD-10-CM

## 2021-05-24 DIAGNOSIS — M47816 Spondylosis without myelopathy or radiculopathy, lumbar region: Secondary | ICD-10-CM

## 2021-05-24 DIAGNOSIS — E034 Atrophy of thyroid (acquired): Secondary | ICD-10-CM

## 2021-05-24 DIAGNOSIS — E063 Autoimmune thyroiditis: Secondary | ICD-10-CM | POA: Diagnosis not present

## 2021-05-24 DIAGNOSIS — E038 Other specified hypothyroidism: Secondary | ICD-10-CM

## 2021-05-24 LAB — POC COVID19 BINAXNOW: SARS Coronavirus 2 Ag: NEGATIVE

## 2021-05-24 MED ORDER — LEVOTHYROXINE SODIUM 75 MCG PO TABS: 75.0000 ug | ORAL_TABLET | Freq: Every day | ORAL | 3 refills | Status: AC

## 2021-05-24 MED ORDER — AZITHROMYCIN 250 MG PO TABS: ORAL_TABLET | ORAL | 0 refills | Status: AC

## 2021-05-24 MED ORDER — METHYLPREDNISOLONE 4 MG PO TBPK: ORAL_TABLET | ORAL | 0 refills | Status: DC

## 2021-05-24 NOTE — Progress Notes (Signed)
? ?Established Patient Office Visit ? ?Subjective:  ?Patient ID: Melissa English, female    DOB: December 13, 1950  Age: 71 y.o. MRN: [id number] ? ?CC:  ?Chief Complaint  ?Patient presents with  ? URI  ?  Patient complains of congestion and cough, right ear pain, burning in chest. Symptoms started Saturday.  ? ? ?URI  ?Associated symptoms include chest pain, coughing and wheezing. Pertinent negatives include no abdominal pain.  ? ?Melissa English presents for upper respiratory infection.  She is negative for COVID, ?She complains of burning in the chest no wheezing ? ?Past Medical History:  ?Diagnosis Date  ? Anxiety   ? Arthritis 2018  ? hands, back...getting injections for back  ? CAD (coronary artery disease)   ? a. 08/2016 MV: EF 45%, no ischemia (EF 55-60% by echo); b. 05/2021 Cor CTA: LM nl, LAD >70p (FFRct 0.85), LCX 60-69p (FFRct 0.96), RCA non-dom, nl (FFRct 0.88). Cor Ca2+ = 613 (92nd %'ile).  ? GERD (gastroesophageal reflux disease)   ? Hiatal hernia   ? a. 05/2021 Cor CTA: Hiatal hernia incidentally noted.  ? History of echocardiogram   ? a. 09/2016 Echo: EF 55-60%, no rmwa.  ? HLD (hyperlipidemia)   ? HTN (hypertension)   ? Hypothyroidism   ? Sleep apnea   ? uses cpap  ? ? ?Past Surgical History:  ?Procedure Laterality Date  ? ABDOMINAL HYSTERECTOMY    ? BACK SURGERY  11/05/2017  ? CARPAL TUNNEL RELEASE Right 2008  ? Dr. Derrel Nip  ? CARPAL TUNNEL RELEASE Left 11/01/2016  ? Procedure: CARPAL TUNNEL RELEASE;  Surgeon: Earnestine Leys, MD;  Location: [facility] ORS;  Service: Orthopedics;  Laterality: Left;  ? CHOLECYSTECTOMY    ? COLONOSCOPY WITH PROPOFOL N/A 10/03/2019  ? Procedure: COLONOSCOPY WITH PROPOFOL;  Surgeon: Virgel Manifold, MD;  Location: [facility] END[facility]COPY;  Service: Endoscopy;  Laterality: N/A;  ? [place]  ? L4 - L5.  no metal in back  ? TOTAL VAGINAL HYSTERECTOMY  1998  ? ? ?Family History  ?Problem Relation Age of Onset  ? Breast cancer Neg Hx   ? ? ?Social History  ? ?Socioeconomic History  ?  Marital status: Married  ?  Spouse name: Not on file  ? Number of children: 3  ? Years of education: Not on file  ? Highest education level: Not on file  ?Occupational History  ? Occupation: housewife  ?Tobacco Use  ? Smoking status: Never  ? Smokeless tobacco: Never  ?Vaping Use  ? Vaping Use: Never used  ?Substance and Sexual Activity  ? Alcohol use: Yes  ?  Comment: occasional wine or beer  ? Drug use: No  ? Sexual activity: Not on file  ?Other Topics Concern  ? Not on file  ?Social History Narrative  ? Not on file  ? ?Social Determinants of Health  ? ?Financial Resource Strain: Low Risk   ? Difficulty of Paying Living Expenses: Not hard at all  ?Food Insecurity: No Food Insecurity  ? Worried About Charity fundraiser in the Last Year: Never true  ? Ran Out of Food in the Last Year: Never true  ?Transportation Needs: No Transportation Needs  ? Lack of Transportation (Medical): No  ? Lack of Transportation (Non-Medical): No  ?Physical Activity: Insufficiently Active  ? Days of Exercise per Week: 2 days  ? Minutes of Exercise per Session: 60 min  ?Stress: No Stress Concern Present  ? Feeling of Stress : Not at all  ?  Social Connections: Socially Integrated  ? Frequency of Communication with Friends and Family: More than three times a week  ? Frequency of Social Gatherings with Friends and Family: More than three times a week  ? Attends Religious Services: More than 4 times per year  ? Active Member of Clubs or Organizations: Yes  ? Attends Archivist Meetings: More than 4 times per year  ? Marital Status: Married  ?Intimate Partner Violence: Not At Risk  ? Fear of Current or Ex-Partner: No  ? Emotionally Abused: No  ? Physically Abused: No  ? Sexually Abused: No  ? ? ? ?Current Outpatient Medications:  ?  acetaminophen (TYLENOL) 650 MG CR tablet, 1 tab as needed for pain, 2- 3 times/days, 90 days, Disp: , Rfl:  ?  ALPRAZolam (XANAX) 0.5 MG tablet, Take 1 tablet (0.5 mg total) by mouth 2 (two) times daily  as needed for anxiety., Disp: 180 tablet, Rfl: 1 ?  aspirin 81 MG tablet, Take 81 mg by mouth daily., Disp: , Rfl:  ?  atorvastatin (LIPITOR) 40 MG tablet, Take 1 tablet (40 mg total) by mouth daily., Disp: 90 tablet, Rfl: 3 ?  azithromycin (ZITHROMAX) 250 MG tablet, Take 2 tablets on day 1, then 1 tablet daily on days 2 through 5, Disp: 6 tablet, Rfl: 0 ?  FLUoxetine (PROZAC) 20 MG capsule, TAKE 1 CAPSULE BY MOUTH  DAILY, Disp: 90 capsule, Rfl: 3 ?  isosorbide mononitrate (IMDUR) 30 MG 24 hr tablet, Take 0.5 tablets (15 mg total) by mouth daily., Disp: 45 tablet, Rfl: 3 ?  levothyroxine (SYNTHROID) 75 MCG tablet, Take 1 tablet (75 mcg total) by mouth daily., Disp: 90 tablet, Rfl: 3 ?  lisinopril (ZESTRIL) 10 MG tablet, TAKE 1 TABLET BY MOUTH  DAILY, Disp: 90 tablet, Rfl: 3 ?  methylPREDNISolone (MEDROL DOSEPAK) 4 MG TBPK tablet, As directed, Disp: 21 each, Rfl: 0 ?  nitroGLYCERIN (NITROSTAT) 0.4 MG SL tablet, Place 1 tablet (0.4 mg total) under the tongue every 5 (five) minutes as needed for chest pain., Disp: 25 tablet, Rfl: 3 ?  omeprazole (PRILOSEC) 20 MG capsule, TAKE 1 CAPSULE BY MOUTH  DAILY, Disp: 90 capsule, Rfl: 3  ? ?Allergies  ?Allergen Reactions  ? Codeine Nausea And Vomiting and Other (See Comments)  ?  Severe n & v ?Severe n & v  ? Sulfa Antibiotics Nausea And Vomiting  ?  Occurred a long time ago so patient is unsure of reaction  ? Other Other (See Comments)  ? Sulfasalazine   ? Prednisone Other (See Comments)  ?  Could not sleep and felt nervous.  ? ? ?ROS ?Review of Systems  ?Constitutional: Negative.   ?HENT: Negative.    ?Eyes:  Positive for itching.  ?Respiratory:  Positive for cough and wheezing.   ?Cardiovascular:  Positive for chest pain. Negative for palpitations.  ?     Retrosternal burning  ?Gastrointestinal: Negative.  Negative for abdominal pain.  ?Endocrine: Negative.   ?Genitourinary: Negative.   ?Musculoskeletal: Negative.   ?Skin: Negative.   ?Allergic/Immunologic: Negative.    ?Neurological: Negative.   ?Hematological: Negative.   ?Psychiatric/Behavioral: Negative.    ?All other systems reviewed and are negative. ? ?  ?Objective:  ?  ?Physical Exam ?Vitals reviewed.  ?Constitutional:   ?   Appearance: Normal appearance. She is obese.  ?HENT:  ?   Ears:  ?   Comments: Wax rt ear   lt  ear drum is red ?   Mouth/Throat:  ?  Mouth: Mucous membranes are moist.  ?Eyes:  ?   Pupils: Pupils are equal, round, and reactive to light.  ?Neck:  ?   Vascular: No carotid bruit.  ?Cardiovascular:  ?   Rate and Rhythm: Normal rate and regular rhythm.  ?   Pulses: Normal pulses.  ?   Heart sounds: Normal heart sounds.  ?Pulmonary:  ?   Effort: Pulmonary effort is normal.  ?   Breath sounds: Normal breath sounds.  ?Abdominal:  ?   General: Bowel sounds are normal.  ?   Palpations: Abdomen is soft. There is no hepatomegaly, splenomegaly or mass.  ?   Tenderness: There is no abdominal tenderness.  ?   Hernia: No hernia is present.  ?Musculoskeletal:     ?   General: No tenderness.  ?   Cervical back: Neck supple.  ?   Right lower leg: No edema.  ?   Left lower leg: No edema.  ?Skin: ?   Findings: No rash.  ?Neurological:  ?   Mental Status: She is alert and oriented to person, place, and time.  ?   Motor: No weakness.  ?Psychiatric:     ?   Mood and Affect: Mood and affect normal.     ?   Behavior: Behavior normal.  ?Findings ? ? ?BP 126/76   Pulse (!) 57   Ht '5\' 6"'$  (1.676 m)   Wt 201 lb 14.4 oz (91.6 kg)   BMI 32.59 kg/m?  ?Wt Readings from Last 3 Encounters:  ?05/24/21 201 lb 14.4 oz (91.6 kg)  ?05/18/21 201 lb (91.2 kg)  ?05/10/21 199 lb (90.3 kg)  ? ? ? ?Health Maintenance Due  ?Topic Date Due  ? TETANUS/TDAP  Never done  ? COVID-19 Vaccine (3 - Moderna risk series) 04/30/2019  ? ? ?There are no preventive care reminders to display for this patient. ? ?Lab Results  ?Component Value Date  ? TSH 0.018 (L) 05/10/2021  ? ?Lab Results  ?Component Value Date  ? WBC 7.2 05/10/2021  ? HGB 13.2 05/10/2021  ?  HCT 41.4 05/10/2021  ? MCV 95.8 05/10/2021  ? PLT 163 05/10/2021  ? ?Lab Results  ?Component Value Date  ? NA 138 05/10/2021  ? K 4.6 05/10/2021  ? CO2 25 05/10/2021  ? GLUCOSE 131 (H) 05/10/2021  ? BUN 16 05/11/18

## 2021-05-24 NOTE — Assessment & Plan Note (Signed)
I will give the patient a Medrol Dosepak and a Z-Pak as directed ?

## 2021-05-24 NOTE — Assessment & Plan Note (Signed)
-   Patient's back pain is under control with medication.  - Encouraged the patient to stretch or do yoga as able to help with back pain 

## 2021-05-24 NOTE — Assessment & Plan Note (Signed)

## 2021-05-24 NOTE — Assessment & Plan Note (Signed)
Blood pressure is stable 

## 2021-05-24 NOTE — Assessment & Plan Note (Signed)
We will reduce levothyroxine to 75 mcg p.o. daily ?

## 2021-05-31 ENCOUNTER — Ambulatory Visit (INDEPENDENT_AMBULATORY_CARE_PROVIDER_SITE_OTHER): Payer: Medicare Other

## 2021-05-31 DIAGNOSIS — R0609 Other forms of dyspnea: Secondary | ICD-10-CM

## 2021-05-31 LAB — ECHOCARDIOGRAM COMPLETE
AR max vel: 2.92 cm2
AV Area VTI: 2.84 cm2
AV Area mean vel: 2.58 cm2
AV Mean grad: 3 mmHg
AV Peak grad: 4.4 mmHg
Ao pk vel: 1.05 m/s
Area-P 1/2: 4.6 cm2
Calc EF: 59.3 %
S' Lateral: 3.1 cm
Single Plane A2C EF: 57.9 %
Single Plane A4C EF: 60.7 %

## 2021-06-01 ENCOUNTER — Encounter: Payer: Self-pay | Admitting: Nurse Practitioner

## 2021-06-01 ENCOUNTER — Ambulatory Visit: Payer: Medicare Other | Admitting: Nurse Practitioner

## 2021-06-01 VITALS — BP 110/66 | HR 56 | Ht 66.0 in | Wt 197.0 lb

## 2021-06-01 DIAGNOSIS — I25119 Atherosclerotic heart disease of native coronary artery with unspecified angina pectoris: Secondary | ICD-10-CM

## 2021-06-01 DIAGNOSIS — I10 Essential (primary) hypertension: Secondary | ICD-10-CM | POA: Diagnosis not present

## 2021-06-01 DIAGNOSIS — E785 Hyperlipidemia, unspecified: Secondary | ICD-10-CM

## 2021-06-01 DIAGNOSIS — G4733 Obstructive sleep apnea (adult) (pediatric): Secondary | ICD-10-CM | POA: Diagnosis not present

## 2021-06-01 DIAGNOSIS — Z79899 Other long term (current) drug therapy: Secondary | ICD-10-CM | POA: Diagnosis not present

## 2021-06-01 NOTE — Patient Instructions (Addendum)
Medication Instructions:  -Your physician recommends that you continue on your current medications as directed. Please refer to the Current Medication list given to you today.  *If you need a refill on your cardiac medications before your next appointment, please call your pharmacy*   Lab Work: - Your physician recommends that you return for FASTING lab work in: 1 month  Lipid/ Liver panel (Do not eat or drink anything for 8 hours prior to your lab draw, except for black coffee or water)  [place] at [facility] 1st desk on the right to check in (REGISTRATION)  Lab hours: Monday- Friday (7:30 am- 5:30 pm)    If you have labs (blood work) drawn today and your tests are completely normal, you will receive your results only by: MyChart Message (if you have MyChart) OR A paper copy in the mail If you have any lab test that is abnormal or we need to change your treatment, we will call you to review the results.   Testing/Procedures: - none ordered   Follow-Up: At [facility], you and your health needs are our priority.  As part of our continuing mission to provide you with exceptional heart care, we have created designated Provider Care Teams.  These Care Teams include your primary Cardiologist (physician) and Advanced Practice Providers (APPs -  Physician Assistants and Nurse Practitioners) who all work together to provide you with the care you need, when you need it.  We recommend signing up for the patient portal called "MyChart".  Sign up information is provided on this After Visit Summary.  MyChart is used to connect with patients for Virtual Visits (Telemedicine).  Patients are able to view lab/test results, encounter notes, upcoming appointments, etc.  Non-urgent messages can be sent to your provider as well.   To learn more about what you can do with MyChart, go to [email or web address].    Your next appointment:   As scheduled   The format for your next  appointment:   In Person  Provider:   Kathlyn Sacramento, MD    Other Instructions N/a  Important Information About Sugar

## 2021-06-01 NOTE — Progress Notes (Signed)
Office Visit    Patient Name: Melissa English Date of Encounter: 06/01/2021  Primary Care Provider:  Cletis Athens, MD Primary Cardiologist:  Kathlyn Sacramento, MD  Chief Complaint    71 year old female with history of hypertension, hyperlipidemia, obstructive sleep apnea on CPAP, hypothyroidism, and chest pain, presents for follow-up of CAD and precordial chest pain.  Past Medical History    Past Medical History:  Diagnosis Date   Anxiety    Arthritis 2018   hands, back...getting injections for back   CAD (coronary artery disease)    a. 08/2016 MV: EF 45%, no ischemia (EF 55-60% by echo); b. 05/2021 Cor CTA: LM nl, LAD >70p (FFRct 0.85), LCX 60-69p (FFRct 0.96), RCA non-dom, nl (FFRct 0.88). Cor Ca2+ = 613 (92nd %'ile).   GERD (gastroesophageal reflux disease)    Hiatal hernia    a. 05/2021 Cor CTA: Hiatal hernia incidentally noted.   History of echocardiogram    a. 09/2016 Echo: EF 55-60%, no rmwa; b. 05/2021 Echo: EF 55-60%, no rwma, GrI DD, nl RV fxn, mild MR.   HLD (hyperlipidemia)    HTN (hypertension)    Hypothyroidism    Sleep apnea    uses cpap   Past Surgical History:  Procedure Laterality Date   ABDOMINAL HYSTERECTOMY     BACK SURGERY  11/05/2017   CARPAL TUNNEL RELEASE Right 2008   Dr. Derrel Nip   CARPAL TUNNEL RELEASE Left 11/01/2016   Procedure: CARPAL TUNNEL RELEASE;  Surgeon: Earnestine Leys, MD;  Location: [facility] OR[facility];  Service: Orthopedics;  Laterality: Left;   CHOLECYSTECTOMY     COLONOSCOPY WITH PROPOFOL N/A 10/03/2019   Procedure: COLONOSCOPY WITH PROPOFOL;  Surgeon: Virgel Manifold, MD;  Location: [facility];  Service: Endoscopy;  Laterality: N/A;   LUMBAR DISC SURGERY  1998   L4 - L5.  no metal in back   TOTAL VAGINAL HYSTERECTOMY  1998    Allergies  Allergies  Allergen Reactions   Codeine Nausea And Vomiting and Other (See Comments)    Severe n & v Severe n & v   Sulfa Antibiotics Nausea And Vomiting    Occurred a long time ago so patient is  unsure of reaction   Other Other (See Comments)   Sulfasalazine    Prednisone Other (See Comments)    Could not sleep and felt nervous.    History of Present Illness    71 year old female with the above past medical history including hypertension, hyperlipidemia, obstructive sleep apnea on CPAP, hypothyroidism, and chest pain.  She previous underwent stress testing in August 2018, which showed no evidence of ischemia or infarct.  EF was recorded at 45% and follow-up echo showed an EF of 55 to 60%.  She was recently seen in cardiology clinic on May 2, with complaints of sharp, left-sided chest pain and progressive dyspnea exertion.  Coronary CT angiogram was undertaken and showed greater than 70% proximal LAD stenosis and a 60-69% proximal left circumflex stenosis.  Coronary calcium was elevated at 613 (92nd percentile).  Images were sent for FFR CT analysis, which returned normal at 0.85 for the LAD, 0.96 for the left circumflex, and 0.88 for the right coronary artery.  Melissa English was last seen in cardiology clinic on May 10, at which time she was not having recurrence of chest pain but noted occasional paresthesia in her left arm or leg, when lying on her side.  She also reported 1 year history of dyspnea on exertion, which has been relatively stable.  Despite dyspnea exertion, she noted good exercise tolerance while working in her yard.  We agreed to pursue an initial course of medical therapy including aspirin, titration of statin, and low-dose long-acting nitrate therapy.  Earlier this week, previously planned echocardiogram which showed normal LV function and mild mitral regurgitation.  Since her last visit, she has noted some improvement in activity tolerance.  She has not had any chest pain.  She has been tolerating long-acting nitrate therapy well and thinks it has made an improvement.  She does still experience dyspnea exertion with inclines/stairs.  She and her husband have yet to start a  walking program but do plan to.  She denies palpitations, PND, orthopnea, dizziness, syncope, edema, or early satiety.  Home Medications    Current Outpatient Medications  Medication Sig Dispense Refill   acetaminophen (TYLENOL) 650 MG CR tablet 1 tab as needed for pain, 2- 3 times/days, 90 days     ALPRAZolam (XANAX) 0.5 MG tablet Take 1 tablet (0.5 mg total) by mouth 2 (two) times daily as needed for anxiety. 180 tablet 1   aspirin 81 MG tablet Take 81 mg by mouth daily.     atorvastatin (LIPITOR) 40 MG tablet Take 1 tablet (40 mg total) by mouth daily. 90 tablet 3   FLUoxetine (PROZAC) 20 MG capsule TAKE 1 CAPSULE BY MOUTH  DAILY 90 capsule 3   isosorbide mononitrate (IMDUR) 30 MG 24 hr tablet Take 0.5 tablets (15 mg total) by mouth daily. 45 tablet 3   levothyroxine (SYNTHROID) 75 MCG tablet Take 1 tablet (75 mcg total) by mouth daily. 90 tablet 3   lisinopril (ZESTRIL) 10 MG tablet TAKE 1 TABLET BY MOUTH  DAILY 90 tablet 3   methylPREDNISolone (MEDROL DOSEPAK) 4 MG TBPK tablet As directed 21 each 0   nitroGLYCERIN (NITROSTAT) 0.4 MG SL tablet Place 1 tablet (0.4 mg total) under the tongue every 5 (five) minutes as needed for chest pain. 25 tablet 3   omeprazole (PRILOSEC) 20 MG capsule TAKE 1 CAPSULE BY MOUTH  DAILY 90 capsule 3   No current facility-administered medications for this visit.     Review of Systems    Still some dyspnea on exertion with inclines and stairs.  She denies chest pain, palpitations, PND, orthopnea, dizziness, syncope, edema, or early satiety.  All other systems reviewed and are otherwise negative except as noted above.    Physical Exam    VS:  BP 110/66 (BP Location: Left Arm, Patient Position: Sitting, Cuff Size: Normal)   Pulse (!) 56   Ht '5\' 6"'$  (1.676 m)   Wt 197 lb (89.4 kg)   SpO2 96%   BMI 31.80 kg/m  , BMI Body mass index is 31.8 kg/m.     GEN: Well nourished, well developed, in no acute distress. HEENT: normal. Neck: Supple, no JVD,  carotid bruits, or masses. Cardiac: RRR, no murmurs, rubs, or gallops. No clubbing, cyanosis, edema.  Radials/PT 2+ and equal bilaterally.  Respiratory:  Respirations regular and unlabored, clear to auscultation bilaterally. GI: Soft, nontender, nondistended, BS + x 4. MS: no deformity or atrophy. Skin: warm and dry, no rash. Neuro:  Strength and sensation are intact. Psych: Normal affect.  Accessory Clinical Findings    Lab Results  Component Value Date   WBC 7.2 05/10/2021   HGB 13.2 05/10/2021   HCT 41.4 05/10/2021   MCV 95.8 05/10/2021   PLT 163 05/10/2021   Lab Results  Component Value Date   CREATININE 0.87 05/10/2021  BUN 16 05/10/2021   NA 138 05/10/2021   K 4.6 05/10/2021   CL 106 05/10/2021   CO2 25 05/10/2021   Lab Results  Component Value Date   ALT 15 11/03/2020   AST 15 11/03/2020   GGT 20 05/03/2020   ALKPHOS 142 (H) 05/03/2020   BILITOT 0.7 11/03/2020   Lab Results  Component Value Date   CHOL 162 11/03/2020   HDL 51 11/03/2020   LDLCALC 89 11/03/2020   TRIG 120 11/03/2020   CHOLHDL 3.2 11/03/2020    Assessment & Plan    1.  Coronary artery disease/precordial chest pain: Patient recently evaluated with complaints of 2 separate types of chest pain-1 sharp and fleeting, the other site with left arm discomfort and tingling.  She also has some degree of chronic dyspnea on exertion over the past year or so.  Coronary CT angiogram performed in early May showed moderate to severe LAD and circumflex disease with normal FFR CT in both locations.  She was started on low-dose isosorbide at her last visit and since has noted some improvement in activity tolerance without recurrence of chest pain.  She still experiences dyspnea exertion with inclines.  Recent echo showed normal LV and RV function with grade 1 diastolic dysfunction and mild mitral regurgitation.  I encouraged regular activity for 30 minutes daily.  No further ischemic evaluation at this time.   Continue current medications including aspirin, statin, and nitrate therapy.  2.  Essential hypertension: Blood pressure stable on lisinopril and nitrate therapy.  3.  Hyperlipidemia: LDL of 89 last October.  I increased her atorvastatin to 40 mg daily at her last visit.  I strongly encouraged activity and weight loss.  We will plan to follow-up lipids and LFTs in about a month.  4.  Obstructive sleep apnea: Uses CPAP.  5.  Hypothyroidism/iatrogenic hyperthyroidism: TSH 0.018 on May 2, which was up from less than 0.01 last October.  She is on levothyroxine for history of hypothyroidism, and this is being managed by primary care.  6.  Disposition: Follow-up in July with Dr. Mariea Clonts as previously planned.  Murray Hodgkins, NP 06/01/2021, 5:09 PM

## 2021-06-16 DIAGNOSIS — G4733 Obstructive sleep apnea (adult) (pediatric): Secondary | ICD-10-CM | POA: Diagnosis not present

## 2021-06-29 DIAGNOSIS — E7800 Pure hypercholesterolemia, unspecified: Secondary | ICD-10-CM | POA: Insufficient documentation

## 2021-06-29 DIAGNOSIS — I10 Essential (primary) hypertension: Secondary | ICD-10-CM | POA: Diagnosis not present

## 2021-06-29 DIAGNOSIS — F32A Depression, unspecified: Secondary | ICD-10-CM | POA: Insufficient documentation

## 2021-06-29 DIAGNOSIS — F339 Major depressive disorder, recurrent, unspecified: Secondary | ICD-10-CM | POA: Insufficient documentation

## 2021-06-29 DIAGNOSIS — E039 Hypothyroidism, unspecified: Secondary | ICD-10-CM | POA: Diagnosis not present

## 2021-07-05 ENCOUNTER — Other Ambulatory Visit
Admission: RE | Admit: 2021-07-05 | Discharge: 2021-07-05 | Disposition: A | Discharge status: Home / Self Care | Payer: Medicare Other | Attending: Nurse Practitioner | Admitting: Nurse Practitioner

## 2021-07-05 DIAGNOSIS — Z79899 Other long term (current) drug therapy: Secondary | ICD-10-CM | POA: Diagnosis not present

## 2021-07-05 DIAGNOSIS — E785 Hyperlipidemia, unspecified: Secondary | ICD-10-CM | POA: Insufficient documentation

## 2021-07-05 LAB — LIPID PANEL
Cholesterol: 191 mg/dL (ref 0–200)
HDL: 63 mg/dL (ref >40)
LDL Cholesterol: 113 mg/dL — ABNORMAL HIGH (ref 0–99)
Total CHOL/HDL Ratio: 3 RATIO
Triglycerides: 77 mg/dL (ref <150)
VLDL: 15 mg/dL (ref 0–40)

## 2021-07-05 LAB — HEPATIC FUNCTION PANEL
ALT: 18 U/L (ref 0–44)
AST: 21 U/L (ref 15–41)
Albumin: 4.1 g/dL (ref 3.5–5.0)
Alkaline Phosphatase: 125 U/L (ref 38–126)
Bilirubin, Direct: 0.2 mg/dL (ref 0.0–0.2)
Indirect Bilirubin: 1.2 mg/dL — ABNORMAL HIGH (ref 0.3–0.9)
Total Bilirubin: 1.4 mg/dL — ABNORMAL HIGH (ref 0.3–1.2)
Total Protein: 7.6 g/dL (ref 6.5–8.1)

## 2021-07-06 ENCOUNTER — Other Ambulatory Visit: Payer: Self-pay | Admitting: *Deleted

## 2021-07-06 MED ORDER — EZETIMIBE 10 MG PO TABS: 10.0000 mg | ORAL_TABLET | Freq: Every day | ORAL | 3 refills | Status: DC

## 2021-07-08 DIAGNOSIS — G4733 Obstructive sleep apnea (adult) (pediatric): Secondary | ICD-10-CM | POA: Diagnosis not present

## 2021-07-19 DIAGNOSIS — J019 Acute sinusitis, unspecified: Secondary | ICD-10-CM | POA: Diagnosis not present

## 2021-07-19 DIAGNOSIS — J302 Other seasonal allergic rhinitis: Secondary | ICD-10-CM | POA: Diagnosis not present

## 2021-07-21 ENCOUNTER — Encounter: Payer: Self-pay | Admitting: Cardiovascular Disease

## 2021-07-21 ENCOUNTER — Ambulatory Visit: Payer: Medicare Other | Admitting: Cardiovascular Disease

## 2021-07-21 VITALS — BP 124/60 | HR 69 | Ht 64.0 in | Wt 192.5 lb

## 2021-07-21 DIAGNOSIS — I10 Essential (primary) hypertension: Secondary | ICD-10-CM | POA: Diagnosis not present

## 2021-07-21 DIAGNOSIS — I25118 Atherosclerotic heart disease of native coronary artery with other forms of angina pectoris: Secondary | ICD-10-CM | POA: Diagnosis not present

## 2021-07-21 DIAGNOSIS — E785 Hyperlipidemia, unspecified: Secondary | ICD-10-CM

## 2021-07-21 NOTE — Patient Instructions (Signed)
Medication Instructions:  Your physician recommends that you continue on your current medications as directed. Please refer to the Current Medication list given to you today.  *If you need a refill on your cardiac medications before your next appointment, please call your pharmacy*   Lab Work: Your physician recommends that you return for a FASTING lipid profile and Lft: in 6 weeks  Please have your labs drawn at the [facility]. No appt needed. Lab hours are Mon-Fri 7am-6pm.  Stop at the Registration desk to check in   If you have labs (blood work) drawn today and your tests are completely normal, you will receive your results only by: [place] (if you have MyChart) OR A paper copy in the mail If you have any lab test that is abnormal or we need to change your treatment, we will call you to review the results.   Testing/Procedures: None ordered   Follow-Up: At [facility], you and your health needs are our priority.  As part of our continuing mission to provide you with exceptional heart care, we have created designated Provider Care Teams.  These Care Teams include your primary Cardiologist (physician) and Advanced Practice Providers (APPs -  Physician Assistants and Nurse Practitioners) who all work together to provide you with the care you need, when you need it.  We recommend signing up for the patient portal called "MyChart".  Sign up information is provided on this After Visit Summary.  MyChart is used to connect with patients for Virtual Visits (Telemedicine).  Patients are able to view lab/test results, encounter notes, upcoming appointments, etc.  Non-urgent messages can be sent to your provider as well.   To learn more about what you can do with MyChart, go to [email or web address].    Your next appointment:   Your physician wants you to follow-up in: 6 months You will receive a reminder letter in the mail two months in advance. If you don't receive a letter,  please call our office to schedule the follow-up appointment.   The format for your next appointment:   In Person  Provider:   You may see Kathlyn Sacramento, MD or one of the following Advanced Practice Providers on your designated Care Team:   Murray Hodgkins, NP Christell Faith, PA-C Cadence Kathlen Mody, [place]   Other Instructions N/A  Important Information About Sugar

## 2021-07-21 NOTE — Progress Notes (Signed)
Cardiology Office Note   Date:  07/21/2021   ID:  Melissa English, DOB [birth year], MRN [id number]  PCP:  Cletis Athens, MD  Cardiologist:   Kathlyn Sacramento, MD   Chief Complaint  Patient presents with   Other    2 month f/u no complaints today. Meds reviewed verbally with pt.      History of Present Illness: Melissa English is a 71 y.o. female who presents for a follow-up visit regarding coronary artery disease.   She has known history of hypertension, hyperlipidemia, sleep apnea on CPAP and hypothyroidism. She was evaluated in 2018 for atypical chest pain. CTA was negative for pulmonary embolism. Nuclear stress test was normal.  Echocardiogram showed normal LV systolic function and no significant abnormalities.  She was evaluated at that time by pulmonary and was suspected of having exercise-induced asthma.  She was seen in May for worsening exertional dyspnea and atypical chest pain.  She underwent cardiac CTA which showed significant proximal LAD stenosis and moderate left circumflex stenosis.  Calcium score was elevated at 613.  CT FFR showed no obstructive disease. She has been doing well with no recurrent chest pain or shortness of breath.  The dose of atorvastatin was increased but subsequent lipid profile showed that her LDL was still above 100.  Thus, ezetimibe was added and she has been taking this medication with no side effects.  Past Medical History:  Diagnosis Date   Anxiety    Arthritis 2018   hands, back...getting injections for back   CAD (coronary artery disease)    a. 08/2016 MV: EF 45%, no ischemia (EF 55-60% by echo); b. 05/2021 Cor CTA: LM nl, LAD >70p (FFRct 0.85), LCX 60-69p (FFRct 0.96), RCA non-dom, nl (FFRct 0.88). Cor Ca2+ = 613 (92nd %'ile).   GERD (gastroesophageal reflux disease)    Hiatal hernia    a. 05/2021 Cor CTA: Hiatal hernia incidentally noted.   History of echocardiogram    a. 09/2016 Echo: EF 55-60%, no rmwa; b. 05/2021 Echo: EF 55-60%, no rwma,  GrI DD, nl RV fxn, mild MR.   HLD (hyperlipidemia)    HTN (hypertension)    Hypothyroidism    Sleep apnea    uses cpap    Past Surgical History:  Procedure Laterality Date   ABDOMINAL HYSTERECTOMY     BACK SURGERY  11/05/2017   CARPAL TUNNEL RELEASE Right 2008   Dr. Derrel Nip   CARPAL TUNNEL RELEASE Left 11/01/2016   Procedure: CARPAL TUNNEL RELEASE;  Surgeon: Earnestine Leys, MD;  Location: [facility] ORS;  Service: Orthopedics;  Laterality: Left;   CHOLECYSTECTOMY     COLONOSCOPY WITH PROPOFOL N/A 10/03/2019   Procedure: COLONOSCOPY WITH PROPOFOL;  Surgeon: Virgel Manifold, MD;  Location: [facility] END[facility];  Service: Endoscopy;  Laterality: N/A;   LUMBAR DISC SURGERY  1998   L4 - L5.  no metal in back   TOTAL VAGINAL HYSTERECTOMY  1998     Current Outpatient Medications  Medication Sig Dispense Refill   acetaminophen (TYLENOL) 650 MG CR tablet 1 tab as needed for pain, 2- 3 times/days, 90 days     ALPRAZolam (XANAX) 0.5 MG tablet Take 1 tablet (0.5 mg total) by mouth 2 (two) times daily as needed for anxiety. 180 tablet 1   amoxicillin-clavulanate (AUGMENTIN) 875-125 MG tablet Take 1 tablet by mouth 2 (two) times daily.     aspirin 81 MG tablet Take 81 mg by mouth daily.     atorvastatin (LIPITOR) 40 MG tablet  Take 1 tablet (40 mg total) by mouth daily. 90 tablet 3   ezetimibe (ZETIA) 10 MG tablet Take 1 tablet (10 mg total) by mouth daily. 90 tablet 3   FLUoxetine (PROZAC) 20 MG capsule TAKE 1 CAPSULE BY MOUTH  DAILY 90 capsule 3   isosorbide mononitrate (IMDUR) 30 MG 24 hr tablet Take 0.5 tablets (15 mg total) by mouth daily. 45 tablet 3   levothyroxine (SYNTHROID) 75 MCG tablet Take 1 tablet (75 mcg total) by mouth daily. 90 tablet 3   lisinopril (ZESTRIL) 10 MG tablet TAKE 1 TABLET BY MOUTH  DAILY 90 tablet 3   metFORMIN (GLUCOPHAGE) 500 MG tablet Take 500 mg by mouth daily.     methylPREDNISolone (MEDROL DOSEPAK) 4 MG TBPK tablet As directed 21 each 0   nitroGLYCERIN (NITROSTAT)  0.4 MG SL tablet Place 1 tablet (0.4 mg total) under the tongue every 5 (five) minutes as needed for chest pain. 25 tablet 3   omeprazole (PRILOSEC) 20 MG capsule TAKE 1 CAPSULE BY MOUTH  DAILY 90 capsule 3   phentermine 15 MG capsule Take 15 mg by mouth daily.     No current facility-administered medications for this visit.    Allergies:   Codeine, Sulfa antibiotics, Other, Sulfasalazine, and Prednisone    Social History:  The patient  reports that she has never smoked. She has never used smokeless tobacco. She reports current alcohol use. She reports that she does not use drugs.   Family History:  The patient's family history is negative for coronary artery disease or sudden death.   ROS:  Please see the history of present illness.   Otherwise, review of systems are positive for none.   All other systems are reviewed and negative.    PHYSICAL EXAM: VS:  BP 124/60 (BP Location: Left Arm, Patient Position: Sitting, Cuff Size: Normal)   Pulse 69   Ht '5\' 4"'$  (1.626 m)   Wt 192 lb 8 oz (87.3 kg)   SpO2 96%   BMI 33.04 kg/m  , BMI Body mass index is 33.04 kg/m. GEN: Well nourished, well developed, in no acute distress  HEENT: normal  Neck: no JVD, carotid bruits, or masses Cardiac: RRR; no murmurs, rubs, or gallops,no edema  Respiratory:  clear to auscultation bilaterally, normal work of breathing GI: soft, nontender, nondistended, + BS MS: no deformity or atrophy  Skin: warm and dry, no rash Neuro:  Strength and sensation are intact Psych: euthymic mood, full affect   EKG:  EKG is not ordered today.   Recent Labs: 05/10/2021: BUN 16; Creatinine, Ser 0.87; Hemoglobin 13.2; Platelets 163; Potassium 4.6; Sodium 138; TSH 0.018 07/05/2021: ALT 18    Lipid Panel    Component Value Date/Time   CHOL 191 07/05/2021 1100   CHOL 193 08/23/2011 0743   TRIG 77 07/05/2021 1100   TRIG 121 08/23/2011 0743   HDL 63 07/05/2021 1100   HDL 55 08/23/2011 0743   CHOLHDL 3.0 07/05/2021 1100    VLDL 15 07/05/2021 1100   VLDL 24 08/23/2011 0743   LDLCALC 113 (H) 07/05/2021 1100   LDLCALC 89 11/03/2020 1222   LDLCALC 114 (H) 08/23/2011 0743      Wt Readings from Last 3 Encounters:  07/21/21 192 lb 8 oz (87.3 kg)  06/01/21 197 lb (89.4 kg)  05/24/21 201 lb 14.4 oz (91.6 kg)           08/31/2016    7:51 AM  PAD Screen  Previous PAD dx?  No  Previous surgical procedure? No  Pain with walking? Yes  Subsides with rest? No  Feet/toe relief with dangling? No  Painful, non-healing ulcers? No  Extremities discolored? No      ASSESSMENT AND PLAN:  1.  Coronary artery disease involving native coronary arteries with other forms of angina: She is doing well overall with no recurrent chest pain.  She continues to be on small dose Imdur.  I discussed the importance of continued healthy lifestyle changes.  Overall, she improved her diet and lost 9 pounds since May.  2.  Essential hypertension: Blood pressures well controlled on lisinopril and Imdur.  3 . Hyperlipidemia: I agree with the addition of Zetia given that her LDL was still above 100.  I requested follow-up lipid and liver profile to be done in 6 weeks from now.  If her LDL remains above 70, I recommend adding a PCSK9 inhibitor.  This was discussed with her today.  4.  Obstructive sleep apnea: On CPAP.    Disposition:   FU with me in 6 months  Signed,  Kathlyn Sacramento, MD  07/21/2021 9:24 AM    [place]

## 2021-08-07 DIAGNOSIS — G4733 Obstructive sleep apnea (adult) (pediatric): Secondary | ICD-10-CM | POA: Diagnosis not present

## 2021-08-10 DIAGNOSIS — E119 Type 2 diabetes mellitus without complications: Secondary | ICD-10-CM | POA: Diagnosis not present

## 2021-08-10 DIAGNOSIS — Z713 Dietary counseling and surveillance: Secondary | ICD-10-CM | POA: Diagnosis not present

## 2021-08-18 ENCOUNTER — Other Ambulatory Visit: Payer: Self-pay

## 2021-08-31 ENCOUNTER — Telehealth: Payer: Self-pay | Admitting: Cardiovascular Disease

## 2021-08-31 DIAGNOSIS — E785 Hyperlipidemia, unspecified: Secondary | ICD-10-CM

## 2021-08-31 DIAGNOSIS — I10 Essential (primary) hypertension: Secondary | ICD-10-CM

## 2021-08-31 NOTE — Telephone Encounter (Signed)
Pt c/o BP issue: STAT if pt c/o blurred vision, one-sided weakness or slurred speech  1. What are your last 5 BP readings?  Laying down: 88/49 62 Sitting :     91/55 73 Standing:  74/52   93/46 63   84/58 78 Standing:  67/47 Laying down:  89/54  2. Are you having any other symptoms (ex. Dizziness, headache, blurred vision, passed out)? States she feels dizzy at times, it comes and goes the dizziness. She stated at times when she stands up she feels like she is going to faint, but she hasn't passed out.   3. What is your BP issue? Low blood pressure.

## 2021-08-31 NOTE — Telephone Encounter (Signed)
Spoke with the patient. Pt sts that she has been feeling dizzy for a couple of days. She did a bunch of yard over the weekend and was outdoors for prolonged periods. Pt sts she does try to drink water and stay hydrated, but doesn't always drink enough. Pt denies n/v or diarrhea.  Pt sts that this afternoon while letting her dog out she began to feel faint as if she was going to pass out. Pt denies syncope. Pt checked her BP laying, sitting, and standing, readings are provided below. Pt confirms that she is taking lisinopril 10 mg daily and imdur 15 mg daily. She has been dieting and has lost 15 lbs.  Adv the patient to hold lisinopril for now. Adv her to liberalize her fluid. She sts that she is drinking powerade zero. Adv the pt to change positions slowly. Adv the patient that I will fwd the update to Dr. Fletcher Anon for further recommendation. Patient agreeable with the plan and voiced appreciation for the call.

## 2021-09-01 NOTE — Telephone Encounter (Signed)
She is supposed to get lipid and liver profile soon.  Lets do CBC and basic metabolic profile with that to make sure she is not anemic or dehydrated.

## 2021-09-01 NOTE — Telephone Encounter (Signed)
Patient made aware of Dr. Tyrell Antonio response and recommendation. Pt sts that she was planning on having her fasting lab drawn on 09/05/21. Adv the patient of the additional lab orders added. Patient verbalized understanding and voiced appreciation for the call.

## 2021-09-05 ENCOUNTER — Other Ambulatory Visit
Admission: RE | Admit: 2021-09-05 | Discharge: 2021-09-05 | Disposition: A | Discharge status: Home / Self Care | Payer: Medicare Other | Attending: Cardiovascular Disease | Admitting: Cardiovascular Disease

## 2021-09-05 DIAGNOSIS — E785 Hyperlipidemia, unspecified: Secondary | ICD-10-CM | POA: Diagnosis not present

## 2021-09-05 DIAGNOSIS — I10 Essential (primary) hypertension: Secondary | ICD-10-CM | POA: Insufficient documentation

## 2021-09-05 LAB — COMPREHENSIVE METABOLIC PANEL
ALT: 22 U/L (ref 0–44)
AST: 21 U/L (ref 15–41)
Albumin: 4.2 g/dL (ref 3.5–5.0)
Alkaline Phosphatase: 110 U/L (ref 38–126)
Anion gap: 7 (ref 5–15)
BUN: 20 mg/dL (ref 8–23)
CO2: 27 mmol/L (ref 22–32)
Calcium: 9.3 mg/dL (ref 8.9–10.3)
Chloride: 104 mmol/L (ref 98–111)
Creatinine, Ser: 0.85 mg/dL (ref 0.44–1.00)
GFR, Estimated: >60 mL/min (ref >60)
Glucose, Bld: 111 mg/dL — ABNORMAL HIGH (ref 70–99)
Potassium: 4.3 mmol/L (ref 3.5–5.1)
Sodium: 138 mmol/L (ref 135–145)
Total Bilirubin: 1.5 mg/dL — ABNORMAL HIGH (ref 0.3–1.2)
Total Protein: 7.3 g/dL (ref 6.5–8.1)

## 2021-09-05 LAB — LIPID PANEL
Cholesterol: 121 mg/dL (ref 0–200)
HDL: 52 mg/dL (ref >40)
LDL Cholesterol: 57 mg/dL (ref 0–99)
Total CHOL/HDL Ratio: 2.3 RATIO
Triglycerides: 62 mg/dL (ref <150)
VLDL: 12 mg/dL (ref 0–40)

## 2021-09-05 LAB — CBC WITH DIFFERENTIAL/PLATELET
Abs Immature Granulocytes: 0.01 10*3/uL (ref 0.00–0.07)
Basophils Absolute: 0.1 10*3/uL (ref 0.0–0.1)
Basophils Relative: 1 %
Eosinophils Absolute: 0.2 10*3/uL (ref 0.0–0.5)
Eosinophils Relative: 2 %
HCT: 37.5 % (ref 36.0–46.0)
Hemoglobin: 12.2 g/dL (ref 12.0–15.0)
Immature Granulocytes: 0 %
Lymphocytes Relative: 29 %
Lymphs Abs: 1.8 10*3/uL (ref 0.7–4.0)
MCH: 30.5 pg (ref 26.0–34.0)
MCHC: 32.5 g/dL (ref 30.0–36.0)
MCV: 93.8 fL (ref 80.0–100.0)
Monocytes Absolute: 0.6 10*3/uL (ref 0.1–1.0)
Monocytes Relative: 9 %
Neutro Abs: 3.6 10*3/uL (ref 1.7–7.7)
Neutrophils Relative %: 59 %
Platelets: 125 10*3/uL — ABNORMAL LOW (ref 150–400)
RBC: 4 MIL/uL (ref 3.87–5.11)
RDW: 14.1 % (ref 11.5–15.5)
WBC: 6.2 10*3/uL (ref 4.0–10.5)
nRBC: 0 % (ref 0.0–0.2)

## 2021-09-07 ENCOUNTER — Other Ambulatory Visit: Payer: Self-pay

## 2021-09-07 DIAGNOSIS — G4733 Obstructive sleep apnea (adult) (pediatric): Secondary | ICD-10-CM | POA: Diagnosis not present

## 2021-09-07 DIAGNOSIS — R42 Dizziness and giddiness: Secondary | ICD-10-CM

## 2021-09-30 DIAGNOSIS — S62634A Displaced fracture of distal phalanx of right ring finger, initial encounter for closed fracture: Secondary | ICD-10-CM | POA: Diagnosis not present

## 2021-09-30 DIAGNOSIS — S62624A Displaced fracture of medial phalanx of right ring finger, initial encounter for closed fracture: Secondary | ICD-10-CM | POA: Diagnosis not present

## 2021-10-03 ENCOUNTER — Telehealth: Payer: Self-pay | Admitting: Cardiovascular Disease

## 2021-10-03 NOTE — Telephone Encounter (Signed)
   Name: Melissa English  DOB: 11/30/1950  MRN: [id number]  Primary Cardiologist: Kathlyn Sacramento, MD   Preoperative team, please contact this patient and set up a phone call appointment for further preoperative risk assessment. Please obtain consent and complete medication review. Thank you for your help.  I confirm that guidance regarding antiplatelet and oral anticoagulation therapy has been completed and, if necessary, noted below (none requested).    Lenna Sciara, NP 10/03/2021, [place]

## 2021-10-03 NOTE — Telephone Encounter (Signed)
   Pre-operative Risk Assessment    Patient Name: Melissa English  DOB: 1950/08/19 MRN: [id number]      Request for Surgical Clearance    Procedure:  Put pin in Right Ring Finger  Date of Surgery:  Clearance TBD                                 Surgeon:  Dr. Rolland Porter Surgeon's Group or Practice Name:  [facility]   Phone number:  [phone] Fax number:  [phone]   Type of Clearance Requested:   - Medical    Type of Anesthesia:  Not Indicated   Additional requests/questions:   Patient wants clearance for this surgery.  Signed, Heloise Beecham   10/03/2021, 2:08 PM

## 2021-10-04 DIAGNOSIS — S62624D Displaced fracture of medial phalanx of right ring finger, subsequent encounter for fracture with routine healing: Secondary | ICD-10-CM | POA: Diagnosis not present

## 2021-10-04 DIAGNOSIS — S62624A Displaced fracture of medial phalanx of right ring finger, initial encounter for closed fracture: Secondary | ICD-10-CM | POA: Diagnosis not present

## 2021-10-04 NOTE — Telephone Encounter (Signed)
Pt called back. She stated that she was the one that originally called and ask for clearance. The surgeons office did not request it. She declined making an appt because she had an appt with the Surgeon this morning and was told she didn't need clearance from our office.  I will remove her out of the pre op callback pool.

## 2021-10-04 NOTE — Telephone Encounter (Signed)
LM to call back.

## 2021-10-07 DIAGNOSIS — E119 Type 2 diabetes mellitus without complications: Secondary | ICD-10-CM | POA: Diagnosis not present

## 2021-10-07 DIAGNOSIS — I10 Essential (primary) hypertension: Secondary | ICD-10-CM | POA: Diagnosis not present

## 2021-10-07 DIAGNOSIS — K219 Gastro-esophageal reflux disease without esophagitis: Secondary | ICD-10-CM | POA: Diagnosis not present

## 2021-10-07 DIAGNOSIS — Z7984 Long term (current) use of oral hypoglycemic drugs: Secondary | ICD-10-CM | POA: Diagnosis not present

## 2021-10-07 DIAGNOSIS — E785 Hyperlipidemia, unspecified: Secondary | ICD-10-CM | POA: Diagnosis not present

## 2021-10-07 DIAGNOSIS — Z7982 Long term (current) use of aspirin: Secondary | ICD-10-CM | POA: Diagnosis not present

## 2021-10-07 DIAGNOSIS — I2510 Atherosclerotic heart disease of native coronary artery without angina pectoris: Secondary | ICD-10-CM | POA: Diagnosis not present

## 2021-10-07 DIAGNOSIS — S62624A Displaced fracture of medial phalanx of right ring finger, initial encounter for closed fracture: Secondary | ICD-10-CM | POA: Diagnosis not present

## 2021-10-07 DIAGNOSIS — G4733 Obstructive sleep apnea (adult) (pediatric): Secondary | ICD-10-CM | POA: Diagnosis not present

## 2021-10-08 DIAGNOSIS — G4733 Obstructive sleep apnea (adult) (pediatric): Secondary | ICD-10-CM | POA: Diagnosis not present

## 2021-10-10 ENCOUNTER — Other Ambulatory Visit: Payer: Self-pay | Admitting: Internal Medicine

## 2021-10-10 DIAGNOSIS — F419 Anxiety disorder, unspecified: Secondary | ICD-10-CM

## 2021-10-19 DIAGNOSIS — Z713 Dietary counseling and surveillance: Secondary | ICD-10-CM | POA: Diagnosis not present

## 2021-10-19 DIAGNOSIS — E119 Type 2 diabetes mellitus without complications: Secondary | ICD-10-CM | POA: Diagnosis not present

## 2021-10-25 DIAGNOSIS — S62624D Displaced fracture of medial phalanx of right ring finger, subsequent encounter for fracture with routine healing: Secondary | ICD-10-CM | POA: Diagnosis not present

## 2021-10-25 DIAGNOSIS — Z4889 Encounter for other specified surgical aftercare: Secondary | ICD-10-CM | POA: Diagnosis not present

## 2021-10-25 DIAGNOSIS — S62624A Displaced fracture of medial phalanx of right ring finger, initial encounter for closed fracture: Secondary | ICD-10-CM | POA: Diagnosis not present

## 2021-11-01 ENCOUNTER — Telehealth: Payer: Self-pay

## 2021-11-01 ENCOUNTER — Telehealth: Payer: Self-pay | Admitting: Cardiovascular Disease

## 2021-11-01 ENCOUNTER — Other Ambulatory Visit: Payer: Self-pay

## 2021-11-01 DIAGNOSIS — Z86010 Personal history of colonic polyps: Secondary | ICD-10-CM

## 2021-11-01 DIAGNOSIS — Z4889 Encounter for other specified surgical aftercare: Secondary | ICD-10-CM | POA: Diagnosis not present

## 2021-11-01 DIAGNOSIS — S62624D Displaced fracture of medial phalanx of right ring finger, subsequent encounter for fracture with routine healing: Secondary | ICD-10-CM | POA: Diagnosis not present

## 2021-11-01 MED ORDER — PEG 3350-KCL-NA BICARB-NACL 420 G PO SOLR: 4000.0000 mL | ORAL | 0 refills | Status: AC

## 2021-11-01 NOTE — Telephone Encounter (Signed)
Patient is scheduled for a telephone visit on 11/07/21 for clearance

## 2021-11-01 NOTE — Telephone Encounter (Signed)
   Pre-operative Risk Assessment    Patient Name: Melissa English  DOB: 01/07/1951 MRN: [id number]      Request for Surgical Clearance    Procedure:   colonoscopy  Date of Surgery:  Clearance 12/12/21                               Surgeon:  not indicated Surgeon's Group or Practice Name:  [facility] Phone number:  [phone] Fax number:  [phone]  Type of Clearance Requested:   - Medical    Type of Anesthesia:  General    Additional requests/questions:    SignedEli Phillips   11/01/2021, 2:57 PM

## 2021-11-01 NOTE — Telephone Encounter (Signed)
Gastroenterology Pre-Procedure Review  Request Date: 12/12/21 Requesting Physician: Dr. Vicente Males  PATIENT REVIEW QUESTIONS: The patient responded to the following health history questions as indicated:    1. Are you having any GI issues? yes (Constipation.  Patient will have a two day bowel prep as this was recommended on previous colonoscopy) 2. Do you have a personal history of Polyps? yes (performed by Dr. Bonna Gains 10/03/19) 3. Do you have a family history of Colon Cancer or Polyps? no 4. Diabetes Mellitus? yes (type 2 has been advised and noted on instructions to hold Metformin 12/10/21, Hold Phentermine 12/05/21) 5. Joint replacements in the past 12 months?yes (broken ring finger 3 weeks ago) 6. Major health problems in the past 3 months?no 7. Any artificial heart valves, MVP, or defibrillator? No however patient see's cardiologist for CAD cardiac request sent to Dr. Fletcher Anon    MEDICATIONS & ALLERGIES:    Patient reports the following regarding taking any anticoagulation/antiplatelet therapy:   Plavix, Coumadin, Eliquis, Xarelto, Lovenox, Pradaxa, Brilinta, or Effient? no Aspirin? yes ('81mg'$  daily)  Patient confirms/reports the following medications:  Current Outpatient Medications  Medication Sig Dispense Refill   acetaminophen (TYLENOL) 650 MG CR tablet 1 tab as needed for pain, 2- 3 times/days, 90 days     ALPRAZolam (XANAX) 0.5 MG tablet Take 1 tablet (0.5 mg total) by mouth 2 (two) times daily as needed for anxiety. 180 tablet 1   aspirin 81 MG tablet Take 81 mg by mouth daily.     levothyroxine (SYNTHROID) 75 MCG tablet Take 1 tablet (75 mcg total) by mouth daily. 90 tablet 3   lisinopril (ZESTRIL) 10 MG tablet TAKE 1 TABLET BY MOUTH  DAILY 90 tablet 3   metFORMIN (GLUCOPHAGE) 500 MG tablet Take 500 mg by mouth daily.     omeprazole (PRILOSEC) 20 MG capsule TAKE 1 CAPSULE BY MOUTH  DAILY 90 capsule 3   phentermine 15 MG capsule Take 15 mg by mouth daily.     polyethylene  glycol-electrolytes (NULYTELY) 420 g solution Take 4,000 mLs by mouth as directed for 2 days. 4000 mL 0   atorvastatin (LIPITOR) 40 MG tablet Take 1 tablet (40 mg total) by mouth daily. 90 tablet 3   ezetimibe (ZETIA) 10 MG tablet Take 1 tablet (10 mg total) by mouth daily. 90 tablet 3   FLUoxetine (PROZAC) 20 MG capsule TAKE 1 CAPSULE BY MOUTH  DAILY 90 capsule 3   isosorbide mononitrate (IMDUR) 30 MG 24 hr tablet Take 0.5 tablets (15 mg total) by mouth daily. 45 tablet 3   nitroGLYCERIN (NITROSTAT) 0.4 MG SL tablet Place 1 tablet (0.4 mg total) under the tongue every 5 (five) minutes as needed for chest pain. 25 tablet 3   No current facility-administered medications for this visit.    Patient confirms/reports the following allergies:  Allergies  Allergen Reactions   Codeine Nausea And Vomiting and Other (See Comments)    Severe n & v Severe n & v   Sulfa Antibiotics Nausea And Vomiting    Occurred a long time ago so patient is unsure of reaction   Other Other (See Comments)   Sulfasalazine    Prednisone Other (See Comments)    Could not sleep and felt nervous.    Orders Placed This Encounter  Procedures   Ambulatory referral to Gastroenterology    Referral Priority:   Routine    Referral Type:   Consultation    Referral Reason:   Specialty Services Required    Referred  to Provider:   Jonathon Bellows, MD    Number of Visits Requested:   1    AUTHORIZATION INFORMATION Primary Insurance: 1D#: Group #:  Secondary Insurance: 1D#: Group #:  SCHEDULE INFORMATION: Date: 12/04 Time: Location: [facility]

## 2021-11-01 NOTE — Telephone Encounter (Signed)
  Patient Consent for Virtual Visit         Melissa English has provided verbal consent on 11/01/2021 for a virtual visit (video or telephone).   CONSENT FOR VIRTUAL VISIT FOR:  Melissa English  By participating in this virtual visit I agree to the following:  I hereby voluntarily request, consent and authorize [facility] and its employed or contracted physicians, physician assistants, nurse practitioners or other licensed health care professionals (the Practitioner), to provide me with telemedicine health care services (the "Services") as deemed necessary by the treating Practitioner. I acknowledge and consent to receive the Services by the Practitioner via telemedicine. I understand that the telemedicine visit will involve communicating with the Practitioner through live audiovisual communication technology and the disclosure of certain medical information by electronic transmission. I acknowledge that I have been given the opportunity to request an in-person assessment or other available alternative prior to the telemedicine visit and am voluntarily participating in the telemedicine visit.  I understand that I have the right to withhold or withdraw my consent to the use of telemedicine in the course of my care at any time, without affecting my right to future care or treatment, and that the Practitioner or I may terminate the telemedicine visit at any time. I understand that I have the right to inspect all information obtained and/or recorded in the course of the telemedicine visit and may receive copies of available information for a reasonable fee.  I understand that some of the potential risks of receiving the Services via telemedicine include:  Delay or interruption in medical evaluation due to technological equipment failure or disruption; Information transmitted may not be sufficient (e.g. poor resolution of images) to allow for appropriate medical decision making by the Practitioner;  and/or  In rare instances, security protocols could fail, causing a breach of personal health information.  Furthermore, I acknowledge that it is my responsibility to provide information about my medical history, conditions and care that is complete and accurate to the best of my ability. I acknowledge that Practitioner's advice, recommendations, and/or decision may be based on factors not within their control, such as incomplete or inaccurate data provided by me or distortions of diagnostic images or specimens that may result from electronic transmissions. I understand that the practice of medicine is not an exact science and that Practitioner makes no warranties or guarantees regarding treatment outcomes. I acknowledge that a copy of this consent can be made available to me via my patient portal ([facility]), or I can request a printed copy by calling the office of [facility].    I understand that my insurance will be billed for this visit.   I have read or had this consent read to me. I understand the contents of this consent, which adequately explains the benefits and risks of the Services being provided via telemedicine.  I have been provided ample opportunity to ask questions regarding this consent and the Services and have had my questions answered to my satisfaction. I give my informed consent for the services to be provided through the use of telemedicine in my medical care

## 2021-11-03 ENCOUNTER — Telehealth: Payer: Self-pay

## 2021-11-03 NOTE — Telephone Encounter (Signed)
Patient contacted office to reschedule her 12/12/21 colonoscopy.  Colonoscopy has been rescheduled to 12/14/21.  Trish in Endo has been notified of new date.  Thanks,  [place], [place]

## 2021-11-07 ENCOUNTER — Ambulatory Visit: Payer: Medicare Other | Attending: Cardiovascular Disease | Admitting: Nurse Practitioner

## 2021-11-07 DIAGNOSIS — G4733 Obstructive sleep apnea (adult) (pediatric): Secondary | ICD-10-CM | POA: Diagnosis not present

## 2021-11-07 DIAGNOSIS — Z01810 Encounter for preprocedural cardiovascular examination: Secondary | ICD-10-CM | POA: Diagnosis not present

## 2021-11-07 NOTE — Progress Notes (Deleted)
Virtual Visit via Telephone Note   Because of Shaylene Paganelli Gaus's co-morbid illnesses, she is at least at moderate risk for complications without adequate follow up.  This format is felt to be most appropriate for this patient at this time.  The patient did not have access to video technology/had technical difficulties with video requiring transitioning to audio format only (telephone).  All issues noted in this document were discussed and addressed.  No physical exam could be performed with this format.  Please refer to the patient's chart for her consent to telehealth for [facility].    Date:  11/07/2021   ID:  Melissa English, DOB August 08, 1950, MRN [id number] The patient was identified using 2 identifiers.  {Patient [phone]::"Home"} {Provider Location:[phone]::"Home Office"}   PCP:  Cletis Athens, MD   [facility] Providers Cardiologist:  Kathlyn Sacramento, MD { Click to update primary MD,subspecialty MD or APP then REFRESH:1}    Evaluation Performed:  {Choose Visit [phone]::"Follow-Up Visit"}  Chief Complaint:  ***  History of Present Illness:    Melissa English is a 71 y.o. female with ***          Past Medical History:  Diagnosis Date   Anxiety    Arthritis 2018   hands, back...getting injections for back   CAD (coronary artery disease)    a. 08/2016 MV: EF 45%, no ischemia (EF 55-60% by echo); b. 05/2021 Cor CTA: LM nl, LAD >70p (FFRct 0.85), LCX 60-69p (FFRct 0.96), RCA non-dom, nl (FFRct 0.88). Cor Ca2+ = 613 (92nd %'ile).   GERD (gastroesophageal reflux disease)    Hiatal hernia    a. 05/2021 Cor CTA: Hiatal hernia incidentally noted.   History of echocardiogram    a. 09/2016 Echo: EF 55-60%, no rmwa; b. 05/2021 Echo: EF 55-60%, no rwma, GrI DD, nl RV fxn, mild MR.   HLD (hyperlipidemia)    HTN (hypertension)    Hypothyroidism    Sleep apnea    uses cpap   Past Surgical History:  Procedure Laterality Date   ABDOMINAL  HYSTERECTOMY     BACK SURGERY  11/05/2017   CARPAL TUNNEL RELEASE Right 2008   Dr. Derrel Nip   CARPAL TUNNEL RELEASE Left 11/01/2016   Procedure: CARPAL TUNNEL RELEASE;  Surgeon: Earnestine Leys, MD;  Location: [facility] ORS;  Service: Orthopedics;  Laterality: Left;   CHOLECYSTECTOMY     COLONOSCOPY WITH PROPOFOL N/A 10/03/2019   Procedure: COLONOSCOPY WITH PROPOFOL;  Surgeon: Virgel Manifold, MD;  Location: [facility] ENDOSCOPY;  Service: Endoscopy;  Laterality: N/A;   LUMBAR DISC SURGERY  1998   L4 - L5.  no metal in back   [place]     No outpatient medications have been marked as taking for the 11/07/21 encounter (Appointment) with [facility].     Allergies:   Codeine, Sulfa antibiotics, Other, Sulfasalazine, and Prednisone   Social History   Tobacco Use   Smoking status: Never   Smokeless tobacco: Never  Vaping Use   Vaping Use: Never used  Substance Use Topics   Alcohol use: Yes    Comment: occasional wine or beer   Drug use: No     Family Hx: The patient's family history is negative for Breast cancer.  ROS:   Please see the history of present illness.    *** All other systems reviewed and are negative.   Prior CV studies:   The following studies were reviewed today:  ***  Labs/Other Tests and Data Reviewed:    EKG:  {EKG/Telemetry Strips [phone]}  Recent Labs: 05/10/2021: TSH 0.018 09/05/2021: ALT 22; BUN 20; Creatinine, Ser 0.85; Hemoglobin 12.2; Platelets 125; Potassium 4.3; Sodium 138   Recent Lipid Panel Lab Results  Component Value Date/Time   CHOL 121 09/05/2021 09:10 AM   CHOL 193 08/23/2011 07:43 AM   TRIG 62 09/05/2021 09:10 AM   TRIG 121 08/23/2011 07:43 AM   HDL 52 09/05/2021 09:10 AM   HDL 55 08/23/2011 07:43 AM   CHOLHDL 2.3 09/05/2021 09:10 AM   LDLCALC 57 09/05/2021 09:10 AM   LDLCALC 89 11/03/2020 12:22 PM   LDLCALC 114 (H) 08/23/2011 07:43 AM    Wt Readings from Last 3 Encounters:   07/21/21 192 lb 8 oz (87.3 kg)  06/01/21 197 lb (89.4 kg)  05/24/21 201 lb 14.4 oz (91.6 kg)     Risk Assessment/Calculations:   {Does this patient have ATRIAL FIBRILLATION?:[phone]}      Objective:    Vital Signs:  There were no vitals taken for this visit.   {[facility] Virtual Exam (Optional):[phone]::"VITAL SIGNS:  reviewed"}  ASSESSMENT & PLAN:    ***      {Are you ordering a CV Procedure (e.g. stress test, cath, DCCV, TEE, etc)?   Press F2        :[id number]}     Time:   Today, I have spent *** minutes with the patient with telehealth technology discussing the above problems.     Medication Adjustments/Labs and Tests Ordered: Current medicines are reviewed at length with the patient today.  Concerns regarding medicines are outlined above.   Tests Ordered: No orders of the defined types were placed in this encounter.   Medication Changes: No orders of the defined types were placed in this encounter.   Follow Up:  {F/U Format:[phone]} {follow up:15908}  Signed, Finis Bud, NP  11/07/2021 8:16 AM    [facility]

## 2021-11-07 NOTE — Progress Notes (Signed)
Virtual Visit via Telephone Note   Because of Melissa English's co-morbid illnesses, she is at least at moderate risk for complications without adequate follow up.  This format is felt to be most appropriate for this patient at this time.  The patient did not have access to video technology/had technical difficulties with video requiring transitioning to audio format only (telephone).  All issues noted in this document were discussed and addressed.  No physical exam could be performed with this format.  Please refer to the patient's chart for her consent to telehealth for [facility].  Evaluation Performed:  Preoperative cardiovascular risk assessment _____________   Date:  11/07/2021   Patient ID:  Melissa English, DOB 03/07/1950, MRN [id number] Patient Location:  Home Provider location:   Office  Primary Care Provider:  Cletis Athens, MD Primary Cardiologist:  Kathlyn Sacramento, MD  Chief Complaint / Patient Profile   71 y.o. y/o female with a h/o CAD, HTN, HLD, OSA on CPAP, Type 2 diabetes, and hypothyroidism who is pending colonoscopy and presents today for telephonic preoperative cardiovascular risk assessment.  Past Medical History    Past Medical History:  Diagnosis Date   Anxiety    Arthritis 2018   hands, back...getting injections for back   CAD (coronary artery disease)    a. 08/2016 MV: EF 45%, no ischemia (EF 55-60% by echo); b. 05/2021 Cor CTA: LM nl, LAD >70p (FFRct 0.85), LCX 60-69p (FFRct 0.96), RCA non-dom, nl (FFRct 0.88). Cor Ca2+ = 613 (92nd %'ile).   GERD (gastroesophageal reflux disease)    Hiatal hernia    a. 05/2021 Cor CTA: Hiatal hernia incidentally noted.   History of echocardiogram    a. 09/2016 Echo: EF 55-60%, no rmwa; b. 05/2021 Echo: EF 55-60%, no rwma, GrI DD, nl RV fxn, mild MR.   HLD (hyperlipidemia)    HTN (hypertension)    Hypothyroidism    Sleep apnea    uses cpap   Past Surgical History:  Procedure Laterality Date   ABDOMINAL  HYSTERECTOMY     BACK SURGERY  11/05/2017   CARPAL TUNNEL RELEASE Right 2008   Dr. Derrel Nip   CARPAL TUNNEL RELEASE Left 11/01/2016   Procedure: CARPAL TUNNEL RELEASE;  Surgeon: Earnestine Leys, MD;  Location: [facility] ORS;  Service: Orthopedics;  Laterality: Left;   CHOLECYSTECTOMY     COLONOSCOPY WITH PROPOFOL N/A 10/03/2019   Procedure: COLONOSCOPY WITH PROPOFOL;  Surgeon: Virgel Manifold, MD;  Location: [facility] ENDOSCOPY;  Service: Endoscopy;  Laterality: N/A;   LUMBAR DISC SURGERY  1998   L4 - L5.  no metal in back   TOTAL VAGINAL HYSTERECTOMY  1998    Allergies  Allergies  Allergen Reactions   Codeine Nausea And Vomiting and Other (See Comments)    Severe n & v Severe n & v   Sulfa Antibiotics Nausea And Vomiting    Occurred a long time ago so patient is unsure of reaction   Other Other (See Comments)   Sulfasalazine    Prednisone Other (See Comments)    Could not sleep and felt nervous.    History of Present Illness    In 2018 patient was evaluated for atypical chest pain.  CTA was negative for PE, nuclear stress test was normal.  Echo revealed normal LV function with no significant valve normalities.  She was evaluated by pulmonary and was suspected at that time of having exercise-induced asthma.  She was seen in May of this year for worsening exertional  dyspnea and atypical chest pain.  She underwent coronary CTA that revealed significant proximal LAD stenosis and moderate left circumflex stenosis.  Coronary calcium score was elevated at 613.  CT FFR revealed no obstructive disease.  Melissa English is a 71 y.o. female who presents via audio/video conferencing for a telehealth visit today.  Pt was last seen in cardiology clinic on July 21, 2021 by Dr. Fletcher Anon.  At that time Melissa English was doing well .  The patient is now pending procedure as outlined above.  Upcoming colonoscopy procedure is scheduled for December 12, 2021.  Surgeon has not been indicated, but surgeon's group or  practice name is [facility].  Type of anesthesia will be general anesthesia.  She is only on aspirin 81 mg daily and is not on any other [place] or antiplatelet.  Since her last visit, she has been doing very well, breathing has improved greatly since last office visit.  She denies any chest pain, recent shortness of breath, palpitations, swelling, orthopnea, PND, syncope, presyncope, dizziness, bleeding, or claudication.  Denies any other questions or concerns today.  Home Medications    Prior to Admission medications   Medication Sig Start Date End Date Taking? Authorizing Provider  acetaminophen (TYLENOL) 650 MG CR tablet 1 tab as needed for pain, 2- 3 times/days, 90 days 08/08/17   [provider]  ALPRAZolam Duanne Moron) 0.5 MG tablet Take 1 tablet (0.5 mg total) by mouth 2 (two) times daily as needed for anxiety. 12/13/20   Cletis Athens, MD  aspirin 81 MG tablet Take 81 mg by mouth daily.    [provider]  atorvastatin (LIPITOR) 40 MG tablet Take 1 tablet (40 mg total) by mouth daily. 05/18/21 11/01/21  Theora Gianotti, NP  ezetimibe (ZETIA) 10 MG tablet Take 1 tablet (10 mg total) by mouth daily. 07/06/21 11/01/21  Theora Gianotti, NP  FLUoxetine (PROZAC) 20 MG capsule TAKE 1 CAPSULE BY MOUTH  DAILY 04/04/21   Cletis Athens, MD  isosorbide mononitrate (IMDUR) 30 MG 24 hr tablet Take 0.5 tablets (15 mg total) by mouth daily. 05/18/21 08/16/21  Theora Gianotti, NP  levothyroxine (SYNTHROID) 75 MCG tablet Take 1 tablet (75 mcg total) by mouth daily. 05/24/21   Cletis Athens, MD  lisinopril (ZESTRIL) 10 MG tablet TAKE 1 TABLET BY MOUTH  DAILY 04/22/21   Cletis Athens, MD  metFORMIN (GLUCOPHAGE) 500 MG tablet Take 500 mg by mouth daily. 07/08/21   [provider]  nitroGLYCERIN (NITROSTAT) 0.4 MG SL tablet Place 1 tablet (0.4 mg total) under the tongue every 5 (five) minutes as needed for chest pain. 05/18/21 08/16/21  Theora Gianotti, NP  omeprazole  (PRILOSEC) 20 MG capsule TAKE 1 CAPSULE BY MOUTH  DAILY 02/28/21   Cletis Athens, MD  phentermine 15 MG capsule Take 15 mg by mouth daily. 06/29/21   [provider]    Physical Exam    Vital Signs:  [place] does not have vital signs available for review today.  Given telephonic nature of communication, physical exam is limited. AAOx3. NAD. Normal affect.  Speech and respirations are unlabored.  Accessory Clinical Findings    None  Assessment & Plan    1.  Preoperative Cardiovascular Risk Assessment:     Ms. Horan's perioperative risk of a major cardiac event is 0.4% according to the Revised Cardiac Risk Index (RCRI).  Therefore, she is at low risk for perioperative complications.   Her functional capacity is excellent at  8.27 METs according to the [facility] Activity Status Index (DASI). Recommendations: According to ACC/AHA guidelines, no further cardiovascular testing needed.  The patient may proceed to surgery at acceptable risk.   Antiplatelet and/or Anticoagulation Recommendations: Because colonoscopy is considered a low bleeding risk procedure, aspirin should be continued without interruption.  However if GI surgeon prefers aspirin to be held due to high bleeding risk, aspirin may be held 7 days prior to procedure.  She is not on any anticoagulation therapy and does not require SBE prophylaxis prior to surgery.  The patient was advised that if she develops new symptoms prior to surgery to contact our office to arrange for a follow-up visit, and she verbalized understanding.  A copy of this note will be routed to requesting surgeon.  Time:   Today, I have spent 9 minutes with the patient with telehealth technology discussing medical history, symptoms, and management plan.     Finis Bud, NP  11/07/2021, 1:53 PM

## 2021-11-18 ENCOUNTER — Other Ambulatory Visit: Payer: Self-pay

## 2021-11-18 ENCOUNTER — Other Ambulatory Visit: Payer: Self-pay | Admitting: Nurse Practitioner

## 2021-11-18 DIAGNOSIS — F419 Anxiety disorder, unspecified: Secondary | ICD-10-CM

## 2021-11-18 MED ORDER — ALPRAZOLAM 0.5 MG PO TABS: 0.5000 mg | ORAL_TABLET | Freq: Two times a day (BID) | ORAL | 0 refills | Status: DC | PRN

## 2021-11-29 DIAGNOSIS — S62624D Displaced fracture of medial phalanx of right ring finger, subsequent encounter for fracture with routine healing: Secondary | ICD-10-CM | POA: Diagnosis not present

## 2021-12-05 ENCOUNTER — Encounter: Payer: Medicare Other | Admitting: Internal Medicine

## 2021-12-06 DIAGNOSIS — K659 Peritonitis, unspecified: Secondary | ICD-10-CM | POA: Diagnosis not present

## 2021-12-06 DIAGNOSIS — D649 Anemia, unspecified: Secondary | ICD-10-CM | POA: Diagnosis not present

## 2021-12-06 DIAGNOSIS — K5732 Diverticulitis of large intestine without perforation or abscess without bleeding: Secondary | ICD-10-CM | POA: Diagnosis not present

## 2021-12-06 DIAGNOSIS — R1030 Lower abdominal pain, unspecified: Secondary | ICD-10-CM | POA: Diagnosis not present

## 2021-12-06 DIAGNOSIS — Z882 Allergy status to sulfonamides status: Secondary | ICD-10-CM | POA: Diagnosis not present

## 2021-12-06 DIAGNOSIS — E079 Disorder of thyroid, unspecified: Secondary | ICD-10-CM | POA: Diagnosis not present

## 2021-12-06 DIAGNOSIS — Z7982 Long term (current) use of aspirin: Secondary | ICD-10-CM | POA: Diagnosis not present

## 2021-12-06 DIAGNOSIS — R1084 Generalized abdominal pain: Secondary | ICD-10-CM | POA: Diagnosis not present

## 2021-12-06 DIAGNOSIS — R1032 Left lower quadrant pain: Secondary | ICD-10-CM | POA: Diagnosis not present

## 2021-12-06 DIAGNOSIS — R1031 Right lower quadrant pain: Secondary | ICD-10-CM | POA: Diagnosis not present

## 2021-12-06 DIAGNOSIS — Z885 Allergy status to narcotic agent status: Secondary | ICD-10-CM | POA: Diagnosis not present

## 2021-12-07 ENCOUNTER — Telehealth: Payer: Self-pay

## 2021-12-07 NOTE — Telephone Encounter (Signed)
Returned patients phone call.  LVM for her to call back to reschedule her colonoscopy due to diverticulitis.  Per Dr. Vicente Males her procedure should be rescheduled 8 weeks out.  Thanks,  [place], [place]

## 2021-12-07 NOTE — Telephone Encounter (Signed)
Patient contacted office to reschedule her 12/14/21 colonoscopy due to yesterday's ER Visit [facility]. She has been diagnosed with Diverticulitis.    Please advise as to how far out I should reschedule her procedure.  Thanks,  [place], [place]

## 2021-12-08 DIAGNOSIS — G4733 Obstructive sleep apnea (adult) (pediatric): Secondary | ICD-10-CM | POA: Diagnosis not present

## 2021-12-12 ENCOUNTER — Ambulatory Visit (INDEPENDENT_AMBULATORY_CARE_PROVIDER_SITE_OTHER): Payer: Medicare Other

## 2021-12-12 VITALS — Wt 192.0 lb

## 2021-12-12 DIAGNOSIS — Z0000 Encounter for general adult medical examination without abnormal findings: Secondary | ICD-10-CM | POA: Diagnosis not present

## 2021-12-12 NOTE — Patient Instructions (Signed)
Melissa English , Thank you for taking time to come for your Medicare Wellness Visit. I appreciate your ongoing commitment to your health goals. Please review the following plan we discussed and let me know if I can assist you in the future.   Screening recommendations/referrals: Colonoscopy: 10/03/19, postponed for now Mammogram: 02/14/21 Bone Density: referral sent Recommended yearly ophthalmology/optometry visit for glaucoma screening and checkup Recommended yearly dental visit for hygiene and checkup  Vaccinations: Influenza vaccine: 11/03/20 Pneumococcal vaccine: 11/03/20 Tdap vaccine: n/d Shingles vaccine: Shingrix 03/18/17, 12/22/17   Covid-19:03/05/19, 04/02/19  Advanced directives: no  Conditions/risks identified: none  Next appointment: Follow up in one year for your annual wellness visit    Preventive Care 44 Years and Older, Female Preventive care refers to lifestyle choices and visits with your health care provider that can promote health and wellness. What does preventive care include? A yearly physical exam. This is also called an annual well check. Dental exams once or twice a year. Routine eye exams. Ask your health care provider how often you should have your eyes checked. Personal lifestyle choices, including: Daily care of your teeth and gums. Regular physical activity. Eating a healthy diet. Avoiding tobacco and drug use. Limiting alcohol use. Practicing safe sex. Taking low-dose aspirin every day. Taking vitamin and mineral supplements as recommended by your health care provider. What happens during an annual well check? The services and screenings done by your health care provider during your annual well check will depend on your age, overall health, lifestyle risk factors, and family history of disease. Counseling  Your health care provider may ask you questions about your: Alcohol use. Tobacco use. Drug use. Emotional well-being. Home and relationship  well-being. Sexual activity. Eating habits. History of falls. Memory and ability to understand (cognition). Work and work Statistician. Reproductive health. Screening  You may have the following tests or measurements: Height, weight, and BMI. Blood pressure. Lipid and cholesterol levels. These may be checked every 5 years, or more frequently if you are over 49 years old. Skin check. Lung cancer screening. You may have this screening every year starting at age 53 if you have a 30-pack-year history of smoking and currently smoke or have quit within the past 15 years. Fecal occult blood test (FOBT) of the stool. You may have this test every year starting at age 72. Flexible sigmoidoscopy or colonoscopy. You may have a sigmoidoscopy every 5 years or a colonoscopy every 10 years starting at age 72. Hepatitis C blood test. Hepatitis B blood test. Sexually transmitted disease (STD) testing. Diabetes screening. This is done by checking your blood sugar (glucose) after you have not eaten for a while (fasting). You may have this done every 1-3 years. Bone density scan. This is done to screen for osteoporosis. You may have this done starting at age 72. Mammogram. This may be done every 72-2 years. Talk to your health care provider about how often you should have regular mammograms. Talk with your health care provider about your test results, treatment options, and if necessary, the need for more tests. Vaccines  Your health care provider may recommend certain vaccines, such as: Influenza vaccine. This is recommended every year. Tetanus, diphtheria, and acellular pertussis (Tdap, Td) vaccine. You may need a Td booster every 10 years. Zoster vaccine. You may need this after age 72. Pneumococcal 13-valent conjugate (PCV13) vaccine. One dose is recommended after age 72. Pneumococcal polysaccharide (PPSV23) vaccine. One dose is recommended after age 72. Talk to your health care  provider about which  screenings and vaccines you need and how often you need them. This information is not intended to replace advice given to you by your health care provider. Make sure you discuss any questions you have with your health care provider. Document Released: 01/22/2015 Document Revised: 09/15/2015 Document Reviewed: 10/27/2014 Elsevier Interactive Patient Education  2017 [employer] Prevention in the Home Falls can cause injuries. They can happen to people of all ages. There are many things you can do to make your home safe and to help prevent falls. What can I do on the outside of my home? Regularly fix the edges of walkways and driveways and fix any cracks. Remove anything that might make you trip as you walk through a door, such as a raised step or threshold. Trim any bushes or trees on the path to your home. Use bright outdoor lighting. Clear any walking paths of anything that might make someone trip, such as rocks or tools. Regularly check to see if handrails are loose or broken. Make sure that both sides of any steps have handrails. Any raised decks and porches should have guardrails on the edges. Have any leaves, snow, or ice cleared regularly. Use sand or salt on walking paths during winter. Clean up any spills in your garage right away. This includes oil or grease spills. What can I do in the bathroom? Use night lights. Install grab bars by the toilet and in the tub and shower. Do not use towel bars as grab bars. Use non-skid mats or decals in the tub or shower. If you need to sit down in the shower, use a plastic, non-slip stool. Keep the floor dry. Clean up any water that spills on the floor as soon as it happens. Remove soap buildup in the tub or shower regularly. Attach bath mats securely with double-sided non-slip rug tape. Do not have throw rugs and other things on the floor that can make you trip. What can I do in the bedroom? Use night lights. Make sure that you have a  light by your bed that is easy to reach. Do not use any sheets or blankets that are too big for your bed. They should not hang down onto the floor. Have a firm chair that has side arms. You can use this for support while you get dressed. Do not have throw rugs and other things on the floor that can make you trip. What can I do in the kitchen? Clean up any spills right away. Avoid walking on wet floors. Keep items that you use a lot in easy-to-reach places. If you need to reach something above you, use a strong step stool that has a grab bar. Keep electrical cords out of the way. Do not use floor polish or wax that makes floors slippery. If you must use wax, use non-skid floor wax. Do not have throw rugs and other things on the floor that can make you trip. What can I do with my stairs? Do not leave any items on the stairs. Make sure that there are handrails on both sides of the stairs and use them. Fix handrails that are broken or loose. Make sure that handrails are as long as the stairways. Check any carpeting to make sure that it is firmly attached to the stairs. Fix any carpet that is loose or worn. Avoid having throw rugs at the top or bottom of the stairs. If you do have throw rugs, attach them to the  floor with carpet tape. Make sure that you have a light switch at the top of the stairs and the bottom of the stairs. If you do not have them, ask someone to add them for you. What else can I do to help prevent falls? Wear shoes that: Do not have high heels. Have rubber bottoms. Are comfortable and fit you well. Are closed at the toe. Do not wear sandals. If you use a stepladder: Make sure that it is fully opened. Do not climb a closed stepladder. Make sure that both sides of the stepladder are locked into place. Ask someone to hold it for you, if possible. Clearly mark and make sure that you can see: Any grab bars or handrails. First and last steps. Where the edge of each step  is. Use tools that help you move around (mobility aids) if they are needed. These include: Canes. Walkers. Scooters. Crutches. Turn on the lights when you go into a dark area. Replace any light bulbs as soon as they burn out. Set up your furniture so you have a clear path. Avoid moving your furniture around. If any of your floors are uneven, fix them. If there are any pets around you, be aware of where they are. Review your medicines with your doctor. Some medicines can make you feel dizzy. This can increase your chance of falling. Ask your doctor what other things that you can do to help prevent falls. This information is not intended to replace advice given to you by your health care provider. Make sure you discuss any questions you have with your health care provider. Document Released: 10/22/2008 Document Revised: 06/03/2015 Document Reviewed: 01/30/2014 Elsevier Interactive Patient Education  2017 [employer].

## 2021-12-12 NOTE — Progress Notes (Signed)
Virtual Visit via Telephone Note  I connected with  Melissa English on 12/12/21 at  2:45 PM EST by telephone and verified that I am speaking with the correct person using two identifiers.  Location: Patient: home Provider: [facility] Persons participating in the virtual visit: patient/Nurse Health Advisor   I discussed the limitations, risks, security and privacy concerns of performing an evaluation and management service by telephone and the availability of in person appointments. The patient expressed understanding and agreed to proceed.  Interactive audio and video telecommunications were attempted between this nurse and patient, however failed, due to patient having technical difficulties OR patient did not have access to video capability.  We continued and completed visit with audio only.  Some vital signs may be absent or patient reported.   Dionisio David, LPN  Subjective:   Melissa English is a 71 y.o. female who presents for Medicare Annual (Subsequent) preventive examination.  Review of Systems     Cardiac Risk Factors include: advanced age (>40mn, >>68women);hypertension     Objective:    There were no vitals filed for this visit. There is no height or weight on file to calculate BMI.     12/12/2021    2:48 PM 11/05/2020   10:08 AM 10/03/2019    9:39 AM 10/20/2016    1:20 PM  Advanced Directives  Does Patient Have a Medical Advance Directive? No No No No  Would patient like information on creating a medical advance directive? No - Patient declined No - Patient declined No - Patient declined No - Patient declined    Current Medications (verified) Outpatient Encounter Medications as of 12/12/2021  Medication Sig   acetaminophen (TYLENOL) 650 MG CR tablet 1 tab as needed for pain, 2- 3 times/days, 90 days   ALPRAZolam (XANAX) 0.5 MG tablet Take 1 tablet (0.5 mg total) by mouth 2 (two) times daily as needed for anxiety.   aspirin 81 MG tablet Take 81 mg by mouth daily.    FLUoxetine (PROZAC) 20 MG capsule TAKE 1 CAPSULE BY MOUTH  DAILY   levothyroxine (SYNTHROID) 75 MCG tablet Take 1 tablet (75 mcg total) by mouth daily.   lisinopril (ZESTRIL) 10 MG tablet TAKE 1 TABLET BY MOUTH  DAILY   metFORMIN (GLUCOPHAGE) 500 MG tablet Take 500 mg by mouth daily.   omeprazole (PRILOSEC) 20 MG capsule TAKE 1 CAPSULE BY MOUTH  DAILY   atorvastatin (LIPITOR) 40 MG tablet Take 1 tablet (40 mg total) by mouth daily.   ezetimibe (ZETIA) 10 MG tablet Take 1 tablet (10 mg total) by mouth daily.   isosorbide mononitrate (IMDUR) 30 MG 24 hr tablet Take 0.5 tablets (15 mg total) by mouth daily.   nitroGLYCERIN (NITROSTAT) 0.4 MG SL tablet Place 1 tablet (0.4 mg total) under the tongue every 5 (five) minutes as needed for chest pain.   phentermine 15 MG capsule Take 15 mg by mouth daily. (Patient not taking: Reported on 12/12/2021)   No facility-administered encounter medications on file as of 12/12/2021.    Allergies (verified) Codeine, Sulfa antibiotics, Other, Sulfasalazine, and Prednisone   History: Past Medical History:  Diagnosis Date   Anxiety    Arthritis 2018   hands, back...getting injections for back   CAD (coronary artery disease)    a. 08/2016 MV: EF 45%, no ischemia (EF 55-60% by echo); b. 05/2021 Cor CTA: LM nl, LAD >70p (FFRct 0.85), LCX 60-69p (FFRct 0.96), RCA non-dom, nl (FFRct 0.88). Cor Ca2+ = 613 (92nd %'  ile).   GERD (gastroesophageal reflux disease)    Hiatal hernia    a. 05/2021 Cor CTA: Hiatal hernia incidentally noted.   History of echocardiogram    a. 09/2016 Echo: EF 55-60%, no rmwa; b. 05/2021 Echo: EF 55-60%, no rwma, GrI DD, nl RV fxn, mild MR.   HLD (hyperlipidemia)    HTN (hypertension)    Hypothyroidism    Sleep apnea    uses cpap   Past Surgical History:  Procedure Laterality Date   ABDOMINAL HYSTERECTOMY     BACK SURGERY  11/05/2017   CARPAL TUNNEL RELEASE Right 2008   Dr. Derrel Nip   CARPAL TUNNEL RELEASE Left 11/01/2016   Procedure:  CARPAL TUNNEL RELEASE;  Surgeon: Earnestine Leys, MD;  Location: [facility] ORS;  Service: Orthopedics;  Laterality: Left;   CHOLECYSTECTOMY     COLONOSCOPY WITH PROPOFOL N/A 10/03/2019   Procedure: COLONOSCOPY WITH PROPOFOL;  Surgeon: Virgel Manifold, MD;  Location: [facility] ENDOSCOPY;  Service: Endoscopy;  Laterality: N/A;   LUMBAR DISC SURGERY  1998   L4 - L5.  no metal in back   TOTAL VAGINAL HYSTERECTOMY  1998   Family History  Problem Relation Age of Onset   Breast cancer Neg Hx    Social History   Socioeconomic History   Marital status: Married    Spouse name: Not on file   Number of children: 3   Years of education: Not on file   Highest education level: Not on file  Occupational History   Occupation: housewife  Tobacco Use   Smoking status: Never   Smokeless tobacco: Never  Vaping Use   Vaping Use: Never used  Substance and Sexual Activity   Alcohol use: Yes    Comment: occasional wine or beer   Drug use: No   Sexual activity: Not on file  Other Topics Concern   Not on file  Social History Narrative   Not on file   Social Determinants of Health   Financial Resource Strain: Low Risk  (12/12/2021)   Overall Financial Resource Strain (CARDIA)    Difficulty of Paying Living Expenses: Not hard at all  Food Insecurity: No Food Insecurity (12/12/2021)   Hunger Vital Sign    Worried About Running Out of Food in the Last Year: Never true    [place] in the Last Year: Never true  Transportation Needs: No Transportation Needs (12/12/2021)   PRAPARE - Hydrologist (Medical): No    Lack of Transportation (Non-Medical): No  Physical Activity: Sufficiently Active (12/12/2021)   Exercise Vital Sign    Days of Exercise per Week: 2 days    Minutes of Exercise per Session: 90 min  Stress: No Stress Concern Present (12/12/2021)   [place]    Feeling of Stress : Not at all  Social  Connections: Moderately Integrated (12/12/2021)   Social Connection and Isolation Panel [NHANES]    Frequency of Communication with Friends and Family: More than three times a week    Frequency of Social Gatherings with Friends and Family: Once a week    Attends Religious Services: More than 4 times per year    Active Member of [employer] or Organizations: No    Attends Archivist Meetings: Never    Marital Status: Married    Tobacco Counseling Counseling given: Not Answered   Clinical Intake:  Pre-visit preparation completed: Yes  Pain : No/denies pain  Nutritional Risks: None Diabetes: Yes CBG done?: No Did pt. bring in CBG monitor from home?: No  How often do you need to have someone help you when you read instructions, pamphlets, or other written materials from your doctor or pharmacy?: 1 - Never  Diabetic?no  Interpreter Needed?: No  Information entered by :: Kirke Shaggy, LPN   Activities of Daily Living    12/12/2021    2:50 PM  In your present state of health, do you have any difficulty performing the following activities:  Hearing? 0  Vision? 0  Difficulty concentrating or making decisions? 0  Walking or climbing stairs? 0  Dressing or bathing? 0  Doing errands, shopping? 0  Preparing Food and eating ? N  Using the Toilet? N  In the past six months, have you accidently leaked urine? N  Do you have problems with loss of bowel control? N  Managing your Medications? N  Managing your Finances? N  Housekeeping or managing your Housekeeping? N    Patient Care Team: Cletis Athens, MD as PCP - General (Internal Medicine) Wellington Hampshire, MD as PCP - Cardiology (Cardiology)  Indicate any recent Medical Services you may have received from other than [facility] providers in the past year (date may be approximate).     Assessment:   This is a routine wellness examination for Melissa English.  Hearing/Vision screen Hearing Screening - Comments:: Wears  aids Vision Screening - Comments:: Readers- [facility] in [place] issues and exercise activities discussed: Current Exercise Habits: Home exercise routine, Type of exercise: walking, Time (Minutes): 60, Frequency (Times/Week): 2, Weekly Exercise (Minutes/Week): 120, Intensity: Mild   Goals Addressed             This Visit's Progress    DIET - EAT MORE FRUITS AND VEGETABLES         Depression Screen    12/12/2021    2:46 PM 11/05/2020   10:08 AM 11/03/2020   12:21 PM 08/05/2019    8:54 AM  PHQ 2/9 Scores  PHQ - 2 Score 0 0 0 0  PHQ- 9 Score 0       Fall Risk    12/12/2021    2:48 PM 11/05/2020   10:21 AM 11/03/2020   12:21 PM 08/05/2019    8:53 AM  Fall Risk   Falls in the past year? 1 0 0 0  Number falls in past yr: 0 0 0 0  Injury with Fall? 0 0 0 0  Risk for fall due to : History of fall(s) No Fall Risks No Fall Risks No Fall Risks  Follow up Falls prevention discussed;Falls evaluation completed Falls evaluation completed Falls evaluation completed     FALL RISK PREVENTION PERTAINING TO THE HOME:  Any stairs in or around the home? Yes  If so, are there any without handrails? No  Home free of loose throw rugs in walkways, pet beds, electrical cords, etc? Yes  Adequate lighting in your home to reduce risk of falls? Yes   ASSISTIVE DEVICES UTILIZED TO PREVENT FALLS:  Life alert? No  Use of a cane, walker or w/c? No  Grab bars in the bathroom? Yes  Shower chair or bench in shower? No  Elevated toilet seat or a handicapped toilet? No    Cognitive Function:    11/05/2020   10:24 AM 11/05/2020   10:11 AM  MMSE - Mini Mental State Exam  Not completed: Unable to complete Unable to complete  12/12/2021    2:50 PM 11/05/2020   10:24 AM 11/05/2020   10:11 AM  6CIT Screen  What Year? 0 points  0 points  What month? 0 points  0 points  What time? 0 points  0 points  Count back from 20 0 points  0 points  Months in reverse 0 points 0  points 0 points  Repeat phrase 0 points 0 points   Total Score 0 points      Immunizations Immunization History  Administered Date(s) Administered   Fluad Quad(high Dose 65+) 11/03/2020   Moderna Sars-Covid-2 Vaccination 03/05/2019, 04/02/2019   PNEUMOCOCCAL CONJUGATE-20 11/03/2020   Zoster Recombinat (Shingrix) 03/18/2017, 12/22/2017    TDAP status: Due, Education has been provided regarding the importance of this vaccine. Advised may receive this vaccine at local pharmacy or Health Dept. Aware to provide a copy of the vaccination record if obtained from local pharmacy or Health Dept. Verbalized acceptance and understanding.  Flu Vaccine status: Up to date  Pneumococcal vaccine status: Due, Education has been provided regarding the importance of this vaccine. Advised may receive this vaccine at local pharmacy or Health Dept. Aware to provide a copy of the vaccination record if obtained from local pharmacy or Health Dept. Verbalized acceptance and understanding.  Covid-19 vaccine status: Completed vaccines  Qualifies for Shingles Vaccine? Yes   Zostavax completed No   Shingrix Completed?: Yes  Screening Tests Health Maintenance  Topic Date Due   DTaP/Tdap/Td (1 - Tdap) Never done   DEXA SCAN  Never done   COVID-19 Vaccine (3 - Moderna risk series) 04/30/2019   INFLUENZA VACCINE  08/09/2021   COLONOSCOPY (Pts 45-36yr Insurance coverage will need to be confirmed)  10/02/2021   MAMMOGRAM  02/14/2022   Medicare Annual Wellness (AWV)  12/13/2022   Pneumonia Vaccine 72 Years old  Completed   Hepatitis C Screening  Completed   Zoster Vaccines- Shingrix  Completed   HPV VACCINES  Aged Out    Health Maintenance  Health Maintenance Due  Topic Date Due   DTaP/Tdap/Td (1 - Tdap) Never done   DEXA SCAN  Never done   COVID-19 Vaccine (3 - Moderna risk series) 04/30/2019   INFLUENZA VACCINE  08/09/2021   COLONOSCOPY (Pts 45-477yrInsurance coverage will need to be confirmed)   10/02/2021    Colorectal cancer screening: Type of screening: Colonoscopy. Completed 10/03/19. Repeat every 2 years- has postponed for now  Mammogram status: Completed 02/14/21. Repeat every year  Bone Density status: Ordered 12/12/21. Pt provided with contact info and advised to call to schedule appt.  Lung Cancer Screening: (Low Dose CT Chest recommended if Age 25100-80ears, 30 pack-year currently smoking OR have quit w/in 15years.) does not qualify.   Additional Screening:  Hepatitis C Screening: does qualify; Completed 11/03/20  Vision Screening: Recommended annual ophthalmology exams for early detection of glaucoma and other disorders of the eye. Is the patient up to date with their annual eye exam?  Yes  Who is the provider or what is the name of the office in which the patient attends annual eye exams? [facility]  If pt is not established with a provider, would they like to be referred to a provider to establish care? No .   Dental Screening: Recommended annual dental exams for proper oral hygiene  Community Resource Referral / Chronic Care Management: CRR required this visit?  No   CCM required this visit?  No      Plan:     I  have personally reviewed and noted the following in the patient's chart:   Medical and social history Use of alcohol, tobacco or illicit drugs  Current medications and supplements including opioid prescriptions. Patient is not currently taking opioid prescriptions. Functional ability and status Nutritional status Physical activity Advanced directives List of other physicians Hospitalizations, surgeries, and ER visits in previous 12 months Vitals Screenings to include cognitive, depression, and falls Referrals and appointments  In addition, I have reviewed and discussed with patient certain preventive protocols, quality metrics, and best practice recommendations. A written personalized care plan for preventive services as well as general  preventive health recommendations were provided to patient.     Dionisio David, LPN   16/01/958   Nurse Notes: none

## 2021-12-14 ENCOUNTER — Encounter: Admission: RE | Payer: Self-pay | Source: Home / Self Care

## 2021-12-14 ENCOUNTER — Ambulatory Visit: Admission: RE | Admit: 2021-12-14 | Payer: Medicare Other | Source: Home / Self Care | Admitting: Gastroenterology

## 2021-12-14 SURGERY — COLONOSCOPY WITH PROPOFOL
Anesthesia: General

## 2021-12-16 DIAGNOSIS — G4733 Obstructive sleep apnea (adult) (pediatric): Secondary | ICD-10-CM | POA: Diagnosis not present

## 2021-12-22 NOTE — Progress Notes (Signed)
I have reviewed this visit and agree with the documentation.   

## 2021-12-22 NOTE — Progress Notes (Signed)
.  awv

## 2021-12-26 ENCOUNTER — Telehealth: Payer: Self-pay

## 2021-12-26 ENCOUNTER — Other Ambulatory Visit: Payer: Self-pay

## 2021-12-26 DIAGNOSIS — Z86010 Personal history of colonic polyps: Secondary | ICD-10-CM

## 2021-12-26 DIAGNOSIS — N951 Menopausal and female climacteric states: Secondary | ICD-10-CM

## 2021-12-26 MED ORDER — PEG 3350-KCL-NA BICARB-NACL 420 G PO SOLR: 4000.0000 mL | Freq: Once | ORAL | 0 refills | Status: AC

## 2021-12-26 NOTE — Telephone Encounter (Signed)
Patient contacted office to schedule her colonoscopy that had to be canceled previously due to diverticulitis.  Colonoscopy has been scheduled with Dr. Vicente Males for 02/06/22.  She will be doing a 2 day prep due to constipation.

## 2021-12-26 NOTE — Telephone Encounter (Signed)
Patient is calling in asking for orders to have a bone density test. Asked for a call back if possible.

## 2021-12-27 NOTE — Telephone Encounter (Signed)
Order placed

## 2022-01-01 DIAGNOSIS — J029 Acute pharyngitis, unspecified: Secondary | ICD-10-CM | POA: Diagnosis not present

## 2022-01-01 DIAGNOSIS — R051 Acute cough: Secondary | ICD-10-CM | POA: Diagnosis not present

## 2022-01-07 DIAGNOSIS — G4733 Obstructive sleep apnea (adult) (pediatric): Secondary | ICD-10-CM | POA: Diagnosis not present

## 2022-01-10 DIAGNOSIS — S62624A Displaced fracture of medial phalanx of right ring finger, initial encounter for closed fracture: Secondary | ICD-10-CM | POA: Diagnosis not present

## 2022-01-17 DIAGNOSIS — H2513 Age-related nuclear cataract, bilateral: Secondary | ICD-10-CM | POA: Diagnosis not present

## 2022-01-17 DIAGNOSIS — H524 Presbyopia: Secondary | ICD-10-CM | POA: Diagnosis not present

## 2022-01-20 ENCOUNTER — Other Ambulatory Visit: Payer: Self-pay | Admitting: Internal Medicine

## 2022-01-20 DIAGNOSIS — Z1231 Encounter for screening mammogram for malignant neoplasm of breast: Secondary | ICD-10-CM

## 2022-01-25 DIAGNOSIS — R059 Cough, unspecified: Secondary | ICD-10-CM | POA: Diagnosis not present

## 2022-01-27 ENCOUNTER — Other Ambulatory Visit: Payer: Self-pay | Admitting: Internal Medicine

## 2022-01-31 DIAGNOSIS — R030 Elevated blood-pressure reading, without diagnosis of hypertension: Secondary | ICD-10-CM | POA: Diagnosis not present

## 2022-01-31 DIAGNOSIS — J40 Bronchitis, not specified as acute or chronic: Secondary | ICD-10-CM | POA: Diagnosis not present

## 2022-02-02 ENCOUNTER — Telehealth: Payer: Self-pay | Admitting: Gastroenterology

## 2022-02-02 NOTE — Telephone Encounter (Signed)
Patient said she has been coughing and had to go to Urgent Care on Tues-Respiratory  Infection rescheduled colonoscopy from 02/06/22 to 02/21/22. Trish in Endo notified.  Thanks, [place], [place]

## 2022-02-02 NOTE — Telephone Encounter (Signed)
Returned patients call.  Left voice message asking her to call me back to reschedule her procedure since she had a fever this morning.  She is scheduled with Dr. Vicente Males 02/06/22.  Thanks, [place], [place]

## 2022-02-02 NOTE — Telephone Encounter (Signed)
Patient called stating that she has been sick and had a fever this morning. She is on antibiotics for 7 days which will end on Monday which is the day of her procedure. She is calling and wondering if she needs to reschedule her colonoscopy. Requesting call back.

## 2022-02-07 DIAGNOSIS — G4733 Obstructive sleep apnea (adult) (pediatric): Secondary | ICD-10-CM | POA: Diagnosis not present

## 2022-02-15 DIAGNOSIS — E119 Type 2 diabetes mellitus without complications: Secondary | ICD-10-CM | POA: Diagnosis not present

## 2022-02-15 DIAGNOSIS — Z713 Dietary counseling and surveillance: Secondary | ICD-10-CM | POA: Diagnosis not present

## 2022-02-21 ENCOUNTER — Encounter
Admission: RE | Disposition: A | Discharge status: Home / Self Care | Payer: Self-pay | Source: Home / Self Care | Attending: Gastroenterology

## 2022-02-21 ENCOUNTER — Ambulatory Visit
Admission: RE | Admit: 2022-02-21 | Discharge: 2022-02-21 | Disposition: A | Discharge status: Home / Self Care | Payer: Medicare Other | Attending: Gastroenterology | Admitting: Gastroenterology

## 2022-02-21 ENCOUNTER — Other Ambulatory Visit: Payer: Self-pay

## 2022-02-21 ENCOUNTER — Encounter: Payer: Self-pay | Admitting: Gastroenterology

## 2022-02-21 ENCOUNTER — Ambulatory Visit: Payer: Medicare Other

## 2022-02-21 DIAGNOSIS — K5730 Diverticulosis of large intestine without perforation or abscess without bleeding: Secondary | ICD-10-CM | POA: Diagnosis not present

## 2022-02-21 DIAGNOSIS — I2510 Atherosclerotic heart disease of native coronary artery without angina pectoris: Secondary | ICD-10-CM | POA: Diagnosis not present

## 2022-02-21 DIAGNOSIS — Z1211 Encounter for screening for malignant neoplasm of colon: Secondary | ICD-10-CM | POA: Insufficient documentation

## 2022-02-21 DIAGNOSIS — G4730 Sleep apnea, unspecified: Secondary | ICD-10-CM | POA: Insufficient documentation

## 2022-02-21 DIAGNOSIS — Z6828 Body mass index (BMI) 28.0-28.9, adult: Secondary | ICD-10-CM | POA: Diagnosis not present

## 2022-02-21 DIAGNOSIS — K219 Gastro-esophageal reflux disease without esophagitis: Secondary | ICD-10-CM | POA: Diagnosis not present

## 2022-02-21 DIAGNOSIS — Z7984 Long term (current) use of oral hypoglycemic drugs: Secondary | ICD-10-CM | POA: Insufficient documentation

## 2022-02-21 DIAGNOSIS — E039 Hypothyroidism, unspecified: Secondary | ICD-10-CM | POA: Diagnosis not present

## 2022-02-21 DIAGNOSIS — I10 Essential (primary) hypertension: Secondary | ICD-10-CM | POA: Insufficient documentation

## 2022-02-21 DIAGNOSIS — Z860100 Personal history of colon polyps, unspecified: Secondary | ICD-10-CM

## 2022-02-21 DIAGNOSIS — K649 Unspecified hemorrhoids: Secondary | ICD-10-CM | POA: Diagnosis not present

## 2022-02-21 DIAGNOSIS — E669 Obesity, unspecified: Secondary | ICD-10-CM | POA: Insufficient documentation

## 2022-02-21 DIAGNOSIS — K5790 Diverticulosis of intestine, part unspecified, without perforation or abscess without bleeding: Secondary | ICD-10-CM | POA: Diagnosis not present

## 2022-02-21 DIAGNOSIS — E119 Type 2 diabetes mellitus without complications: Secondary | ICD-10-CM | POA: Diagnosis not present

## 2022-02-21 HISTORY — PX: COLONOSCOPY WITH PROPOFOL: SHX5780

## 2022-02-21 SURGERY — COLONOSCOPY WITH PROPOFOL
Anesthesia: General

## 2022-02-21 MED ORDER — PROPOFOL 500 MG/50ML IV EMUL
INTRAVENOUS | Status: DC | PRN
  Administered 2022-02-21: 125 ug/kg/min via INTRAVENOUS

## 2022-02-21 MED ORDER — LIDOCAINE HCL (CARDIAC) PF 100 MG/5ML IV SOSY
PREFILLED_SYRINGE | INTRAVENOUS | Status: DC | PRN
  Administered 2022-02-21: 60 mg via INTRAVENOUS

## 2022-02-21 MED ORDER — PROPOFOL 10 MG/ML IV BOLUS
INTRAVENOUS | Status: DC | PRN
  Administered 2022-02-21: 20 mg via INTRAVENOUS
  Administered 2022-02-21: 50 mg via INTRAVENOUS

## 2022-02-21 MED ORDER — SODIUM CHLORIDE 0.9 % IV SOLN: INTRAVENOUS | Status: DC

## 2022-02-21 NOTE — H&P (Signed)
Jonathon Bellows, MD [address], [place], [place], [place], [address] [address], [place], [place], [place], [address] Phone: [phone]  Fax: [phone]  Primary Care Physician:  Cletis Athens, MD   Pre-Procedure History & Physical: HPI:  Melissa English is a 72 y.o. female is here for an colonoscopy.   Past Medical History:  Diagnosis Date   Anxiety    Arthritis 2018   hands, back...getting injections for back   CAD (coronary artery disease)    a. 08/2016 MV: EF 45%, no ischemia (EF 55-60% by echo); b. 05/2021 Cor CTA: LM nl, LAD >70p (FFRct 0.85), LCX 60-69p (FFRct 0.96), RCA non-dom, nl (FFRct 0.88). Cor Ca2+ = 613 (92nd %'ile).   GERD (gastroesophageal reflux disease)    Hiatal hernia    a. 05/2021 Cor CTA: Hiatal hernia incidentally noted.   History of echocardiogram    a. 09/2016 Echo: EF 55-60%, no rmwa; b. 05/2021 Echo: EF 55-60%, no rwma, GrI DD, nl RV fxn, mild MR.   HLD (hyperlipidemia)    HTN (hypertension)    Hypothyroidism    Sleep apnea    uses cpap    Past Surgical History:  Procedure Laterality Date   ABDOMINAL HYSTERECTOMY     BACK SURGERY  11/05/2017   CARPAL TUNNEL RELEASE Right 2008   Dr. Derrel Nip   CARPAL TUNNEL RELEASE Left 11/01/2016   Procedure: CARPAL TUNNEL RELEASE;  Surgeon: Earnestine Leys, MD;  Location: [facility] ORS;  Service: Orthopedics;  Laterality: Left;   CHOLECYSTECTOMY     COLONOSCOPY WITH PROPOFOL N/A 10/03/2019   Procedure: COLONOSCOPY WITH PROPOFOL;  Surgeon: Virgel Manifold, MD;  Location: [facility] ENDOSCOPY;  Service: Endoscopy;  Laterality: N/A;   LUMBAR DISC SURGERY  1998   L4 - L5.  no metal in back   [place]    Prior to Admission medications   Medication Sig Start Date End Date Taking? Authorizing Provider  ezetimibe (ZETIA) 10 MG tablet Take 1 tablet (10 mg total) by mouth daily. 07/06/21 11/01/21 Yes Theora Gianotti, NP  FLUoxetine (PROZAC) 20 MG capsule TAKE 1 CAPSULE BY MOUTH  DAILY 04/04/21   Yes Masoud, Viann Shove, MD  isosorbide mononitrate (IMDUR) 30 MG 24 hr tablet Take 0.5 tablets (15 mg total) by mouth daily. 05/18/21 02/21/22 Yes Theora Gianotti, NP  levothyroxine (SYNTHROID) 75 MCG tablet Take 1 tablet (75 mcg total) by mouth daily. 05/24/21  Yes Masoud, Viann Shove, MD  lisinopril (ZESTRIL) 10 MG tablet TAKE 1 TABLET BY MOUTH  DAILY 04/22/21  Yes Masoud, Viann Shove, MD  metFORMIN (GLUCOPHAGE) 500 MG tablet Take 500 mg by mouth daily. 07/08/21  Yes [provider]  omeprazole (PRILOSEC) 20 MG capsule TAKE 1 CAPSULE BY MOUTH  DAILY 02/28/21  Yes Masoud, Viann Shove, MD  acetaminophen (TYLENOL) 650 MG CR tablet 1 tab as needed for pain, 2- 3 times/days, 90 days 08/08/17   [provider]  ALPRAZolam Duanne Moron) 0.5 MG tablet Take 1 tablet (0.5 mg total) by mouth 2 (two) times daily as needed for anxiety. 11/18/21   Theresia Lo, NP  aspirin 81 MG tablet Take 81 mg by mouth daily.    [provider]  atorvastatin (LIPITOR) 40 MG tablet Take 1 tablet (40 mg total) by mouth daily. Patient not taking: Reported on 02/21/2022 05/18/21 11/01/21  Theora Gianotti, NP  nitroGLYCERIN (NITROSTAT) 0.4 MG SL tablet Place 1 tablet (0.4 mg total) under the tongue every 5 (five) minutes as needed for chest pain. 05/18/21  08/16/21  Theora Gianotti, NP  phentermine 15 MG capsule Take 15 mg by mouth daily. Patient not taking: Reported on 12/12/2021 06/29/21   [provider]    Allergies as of 12/26/2021 - Review Complete 12/26/2021  Allergen Reaction Noted   Codeine Nausea And Vomiting and Other (See Comments) 07/20/2010   Sulfa antibiotics Nausea And Vomiting 07/20/2010   Other Other (See Comments) 08/30/2012   Sulfasalazine  07/20/2010   Prednisone Other (See Comments) 08/08/2017    Family History  Problem Relation Age of Onset   Breast cancer Neg Hx     Social History   Socioeconomic History   Marital status: Married    Spouse name: Not on file    Number of children: 3   Years of education: Not on file   Highest education level: Not on file  Occupational History   Occupation: housewife  Tobacco Use   Smoking status: Never   Smokeless tobacco: Never  Vaping Use   Vaping Use: Never used  Substance and Sexual Activity   Alcohol use: Yes    Comment: occasional wine or beer   Drug use: No   Sexual activity: Not on file  Other Topics Concern   Not on file  Social History Narrative   Not on file   Social Determinants of Health   Financial Resource Strain: Low Risk  (12/12/2021)   Overall Financial Resource Strain (CARDIA)    Difficulty of Paying Living Expenses: Not hard at all  Food Insecurity: No Food Insecurity (12/12/2021)   Hunger Vital Sign    Worried About Running Out of Food in the Last Year: Never true    Ran Out of Food in the Last Year: Never true  Transportation Needs: No Transportation Needs (12/12/2021)   PRAPARE - Hydrologist (Medical): No    Lack of Transportation (Non-Medical): No  Physical Activity: Sufficiently Active (12/12/2021)   Exercise Vital Sign    Days of Exercise per Week: 2 days    Minutes of Exercise per Session: 90 min  Stress: No Stress Concern Present (12/12/2021)   [place]    Feeling of Stress : Not at all  Social Connections: Moderately Integrated (12/12/2021)   Social Connection and Isolation Panel [NHANES]    Frequency of Communication with Friends and Family: More than three times a week    Frequency of Social Gatherings with Friends and Family: Once a week    Attends Religious Services: More than 4 times per year    Active Member of [employer] or Organizations: No    Attends Archivist Meetings: Never    Marital Status: Married  Human resources officer Violence: Not At Risk (12/12/2021)   Humiliation, Afraid, Rape, and Kick questionnaire    Fear of Current or Ex-Partner: No    Emotionally  Abused: No    Physically Abused: No    Sexually Abused: No    Review of Systems: See HPI, otherwise negative ROS  Physical Exam: BP 139/82   Pulse 64   Temp 98.4 F (36.9 C) (Oral)   Resp 16   Ht 5' 6"$  (1.676 m)   Wt 81.2 kg   SpO2 100%   BMI 28.89 kg/m  General:   Alert,  pleasant and cooperative in NAD Head:  Normocephalic and atraumatic. Neck:  Supple; no masses or thyromegaly. Lungs:  Clear throughout to auscultation, normal respiratory effort.    Heart:  +S1, +S2,  Regular rate and rhythm, No edema. Abdomen:  Soft, nontender and nondistended. Normal bowel sounds, without guarding, and without rebound.   Neurologic:  Alert and  oriented x4;  grossly normal neurologically.  Impression/Plan: Melissa English is here for an colonoscopy to be performed for surveillance due to prior history of colon polyps   Risks, benefits, limitations, and alternatives regarding  colonoscopy have been reviewed with the patient.  Questions have been answered.  All parties agreeable.   Jonathon Bellows, MD  02/21/2022, 9:09 AM

## 2022-02-21 NOTE — Anesthesia Preprocedure Evaluation (Addendum)
Anesthesia Evaluation  Patient identified by MRN, date of birth, ID band Patient awake    Reviewed: Allergy & Precautions, NPO status , Patient's Chart, lab work & pertinent test results  History of Anesthesia Complications Negative for: history of anesthetic complications  Airway Mallampati: I   Neck ROM: Full    Dental no notable dental hx.    Pulmonary sleep apnea and Continuous Positive Airway Pressure Ventilation    Pulmonary exam normal breath sounds clear to auscultation       Cardiovascular hypertension, + CAD  Normal cardiovascular exam Rhythm:Regular Rate:Normal  ECG 05/10/21: SB, otherwise normal   Neuro/Psych  PSYCHIATRIC DISORDERS Anxiety     negative neurological ROS     GI/Hepatic hiatal hernia,GERD  ,,  Endo/Other  diabetes, Type 2Hypothyroidism  Obesity   Renal/GU negative Renal ROS     Musculoskeletal  (+) Arthritis ,    Abdominal   Peds  Hematology negative hematology ROS (+)   Anesthesia Other Findings Cardiology note 11/07/21:  1.  Preoperative Cardiovascular Risk Assessment: Ms. Thal's perioperative risk of a major cardiac event is 0.4% according to the Revised Cardiac Risk Index (RCRI).  Therefore, she is at low risk for perioperative complications.   Her functional capacity is excellent at 8.27 METs according to the Duke Activity Status Index (DASI). Recommendations: According to ACC/AHA guidelines, no further cardiovascular testing needed.  The patient may proceed to surgery at acceptable risk.   Antiplatelet and/or Anticoagulation Recommendations: Because colonoscopy is considered a low bleeding risk procedure, aspirin should be continued without interruption.  However if GI surgeon prefers aspirin to be held due to high bleeding risk, aspirin may be held 7 days prior to procedure.  She is not on any anticoagulation therapy and does not require SBE prophylaxis prior to surgery.    Reproductive/Obstetrics                             Anesthesia Physical Anesthesia Plan  ASA: 2  Anesthesia Plan: General   Post-op Pain Management:    Induction: Intravenous  PONV Risk Score and Plan: 3 and Propofol infusion, TIVA and Treatment may vary due to age or medical condition  Airway Management Planned: Natural Airway  Additional Equipment:   Intra-op Plan:   Post-operative Plan:   Informed Consent: I have reviewed the patients History and Physical, chart, labs and discussed the procedure including the risks, benefits and alternatives for the proposed anesthesia with the patient or authorized representative who has indicated his/her understanding and acceptance.       Plan Discussed with: CRNA  Anesthesia Plan Comments: (LMA/GETA backup discussed.  Patient consented for risks of anesthesia including but not limited to:  - adverse reactions to medications - damage to eyes, teeth, lips or other oral mucosa - nerve damage due to positioning  - sore throat or hoarseness - damage to heart, brain, nerves, lungs, other parts of body or loss of life  Informed patient about role of CRNA in peri- and intra-operative care.  Patient voiced understanding.)        Anesthesia Quick Evaluation

## 2022-02-21 NOTE — Anesthesia Postprocedure Evaluation (Signed)
Anesthesia Post Note  Patient: Melissa English  Procedure(s) Performed: COLONOSCOPY WITH PROPOFOL  Patient location during evaluation: PACU Anesthesia Type: General Level of consciousness: awake and alert, oriented and patient cooperative Pain management: pain level controlled Vital Signs Assessment: post-procedure vital signs reviewed and stable Respiratory status: spontaneous breathing, nonlabored ventilation and respiratory function stable Cardiovascular status: blood pressure returned to baseline and stable Postop Assessment: adequate PO intake Anesthetic complications: no   No notable events documented.   Last Vitals:  Vitals:   02/21/22 0934 02/21/22 0946  BP: (!) 88/60 117/70  Pulse: (!) 59 60  Resp: (!) 26 17  Temp: (!) 35.9 C   SpO2: 96% 99%    Last Pain:  Vitals:   02/21/22 0946  TempSrc:   PainSc: 0-No pain                 Darrin Nipper

## 2022-02-21 NOTE — Transfer of Care (Signed)
Immediate Anesthesia Transfer of Care Note  Patient: Melissa English  Procedure(s) Performed: COLONOSCOPY WITH PROPOFOL  Patient Location: PACU  Anesthesia Type:General  Level of Consciousness: awake and drowsy  Airway & Oxygen Therapy: Patient Spontanous Breathing and Patient connected to nasal cannula oxygen  Post-op Assessment: Report given to RN and Post -op Vital signs reviewed and stable  Post vital signs: stable  Last Vitals:  Vitals Value Taken Time  BP 97/71 02/21/22 0937  Temp 35.9 C 02/21/22 0934  Pulse 64 02/21/22 0937  Resp 19 02/21/22 0937  SpO2 96 % 02/21/22 0937  Vitals shown include unvalidated device data.  Last Pain:  Vitals:   02/21/22 0934  TempSrc: Temporal  PainSc: Asleep         Complications: No notable events documented.

## 2022-02-21 NOTE — Op Note (Addendum)
[facility] Gastroenterology Patient Name: Melissa English Procedure Date: 02/21/2022 9:01 AM MRN: [id number] Account #: [id number] Date of Birth: [birth year] Admit Type: Outpatient Age: 72 Room: [facility] ENDO ROOM 2 Gender: Female Note Status: Finalized Instrument Name: Park Meo [id number] Procedure:             Colonoscopy Indications:           Surveillance: Personal history of adenomatous polyps                         on last colonoscopy 5 years ago Providers:             Jonathon Bellows MD, MD Referring MD:          Jonathon Bellows MD, MD (Referring MD) Medicines:             Monitored Anesthesia Care Complications:         No immediate complications. Procedure:             Pre-Anesthesia Assessment:                        - Prior to the procedure, a History and Physical was                         performed, and patient medications, allergies and                         sensitivities were reviewed. The patient's tolerance                         of previous anesthesia was reviewed.                        - The risks and benefits of the procedure and the                         sedation options and risks were discussed with the                         patient. All questions were answered and informed                         consent was obtained.                        - ASA Grade Assessment: II - A patient with mild                         systemic disease.                        After obtaining informed consent, the colonoscope was                         passed under direct vision. Throughout the procedure,                         the patient's blood pressure, pulse, and oxygen  saturations were monitored continuously. The                         Colonoscope was introduced through the anus and                         advanced to the the cecum, identified by the                         appendiceal orifice. The colonoscopy was performed                          with ease. The patient tolerated the procedure well.                         The quality of the bowel preparation was excellent.                         The ileocecal valve, appendiceal orifice, and rectum                         were photographed. Findings:      The perianal and digital rectal examinations were normal.      Multiple medium-mouthed diverticula were found in the sigmoid colon.      The exam was otherwise without abnormality on direct and retroflexion       views. Impression:            - Diverticulosis in the sigmoid colon.                        - The examination was otherwise normal on direct and                         retroflexion views.                        - No specimens collected. Recommendation:        - Discharge patient to home (with escort).                        - Resume previous diet.                        - Continue present medications.                        - Repeat colonoscopy is not recommended due to current                         age (41 years or older) for surveillance. Procedure Code(s):     --- Professional ---                        [phone], Colonoscopy, flexible; diagnostic, including                         collection of specimen(s) by brushing or washing, when                         performed (separate  procedure) Diagnosis Code(s):     --- Professional ---                        Z86.010, Personal history of colonic polyps                        K57.30, Diverticulosis of large intestine without                         perforation or abscess without bleeding CPT copyright 2022 American Medical Association. All rights reserved. The codes documented in this report are preliminary and upon coder review may  be revised to meet current compliance requirements. Jonathon Bellows, MD Jonathon Bellows MD, MD 02/21/2022 9:35:40 AM This report has been signed electronically. Number of Addenda: 0 Note Initiated On: 02/21/2022 9:01 AM Scope Withdrawal Time:  0 hours 8 minutes 27 seconds  Total Procedure Duration: 0 hours 13 minutes 58 seconds  Estimated Blood Loss:  Estimated blood loss: none.      [facility]

## 2022-02-21 NOTE — Anesthesia Procedure Notes (Signed)
Procedure Name: MAC Date/Time: 02/21/2022 9:15 AM  Performed by: Hilbert Odor, CRNAPre-anesthesia Checklist: Patient identified, Emergency Drugs available, Suction available, Patient being monitored and Timeout performed Patient Re-evaluated:Patient Re-evaluated prior to induction Oxygen Delivery Method: Nasal cannula

## 2022-02-22 ENCOUNTER — Ambulatory Visit: Payer: Medicare Other | Admitting: Nurse Practitioner

## 2022-02-22 ENCOUNTER — Encounter: Payer: Self-pay | Admitting: Gastroenterology

## 2022-02-24 ENCOUNTER — Other Ambulatory Visit: Payer: Self-pay | Admitting: Nurse Practitioner

## 2022-02-24 DIAGNOSIS — F419 Anxiety disorder, unspecified: Secondary | ICD-10-CM

## 2022-03-07 ENCOUNTER — Ambulatory Visit
Admission: RE | Admit: 2022-03-07 | Discharge: 2022-03-07 | Disposition: A | Discharge status: Home / Self Care | Payer: Medicare Other | Source: Ambulatory Visit | Attending: Internal Medicine | Admitting: Internal Medicine

## 2022-03-07 DIAGNOSIS — Z1231 Encounter for screening mammogram for malignant neoplasm of breast: Secondary | ICD-10-CM

## 2022-03-08 ENCOUNTER — Other Ambulatory Visit
Admission: RE | Admit: 2022-03-08 | Discharge: 2022-03-08 | Disposition: A | Discharge status: Home / Self Care | Payer: Medicare Other | Attending: Nurse Practitioner | Admitting: Nurse Practitioner

## 2022-03-08 ENCOUNTER — Encounter: Payer: Self-pay | Admitting: Nurse Practitioner

## 2022-03-08 ENCOUNTER — Ambulatory Visit: Payer: Medicare Other | Attending: Nurse Practitioner | Admitting: Nurse Practitioner

## 2022-03-08 VITALS — BP 144/84 | HR 68 | Ht 65.0 in | Wt 187.0 lb

## 2022-03-08 DIAGNOSIS — I10 Essential (primary) hypertension: Secondary | ICD-10-CM | POA: Diagnosis not present

## 2022-03-08 DIAGNOSIS — D649 Anemia, unspecified: Secondary | ICD-10-CM | POA: Diagnosis not present

## 2022-03-08 DIAGNOSIS — E034 Atrophy of thyroid (acquired): Secondary | ICD-10-CM

## 2022-03-08 DIAGNOSIS — I2510 Atherosclerotic heart disease of native coronary artery without angina pectoris: Secondary | ICD-10-CM | POA: Diagnosis not present

## 2022-03-08 DIAGNOSIS — R072 Precordial pain: Secondary | ICD-10-CM

## 2022-03-08 DIAGNOSIS — E785 Hyperlipidemia, unspecified: Secondary | ICD-10-CM

## 2022-03-08 LAB — CBC
HCT: 35.2 % — ABNORMAL LOW (ref 36.0–46.0)
Hemoglobin: 11.2 g/dL — ABNORMAL LOW (ref 12.0–15.0)
MCH: 29.7 pg (ref 26.0–34.0)
MCHC: 31.8 g/dL (ref 30.0–36.0)
MCV: 93.4 fL (ref 80.0–100.0)
Platelets: 195 10*3/uL (ref 150–400)
RBC: 3.77 MIL/uL — ABNORMAL LOW (ref 3.87–5.11)
RDW: 14.8 % (ref 11.5–15.5)
WBC: 7.2 10*3/uL (ref 4.0–10.5)
nRBC: 0 % (ref 0.0–0.2)

## 2022-03-08 MED ORDER — ISOSORBIDE MONONITRATE ER 30 MG PO TB24: 15.0000 mg | ORAL_TABLET | Freq: Every day | ORAL | 0 refills | Status: DC

## 2022-03-08 MED ORDER — EZETIMIBE 10 MG PO TABS: 10.0000 mg | ORAL_TABLET | Freq: Every day | ORAL | 0 refills | Status: DC

## 2022-03-08 NOTE — Progress Notes (Signed)
Office Visit    Patient Name: Melissa English Date of Encounter: 03/08/2022  Primary Care Provider:  Cletis Athens, MD Primary Cardiologist:  Kathlyn Sacramento, MD  Chief Complaint    72 y/o ? w/ a h/o HTN, HL, OSA on CPAP, hypothyroidism, and nonobstructive CAD, who presents for f/u of HTN.    Past Medical History    Past Medical History:  Diagnosis Date   Anxiety    Arthritis 2018   hands, back...getting injections for back   CAD (coronary artery disease)    a. 08/2016 MV: EF 45%, no ischemia (EF 55-60% by echo); b. 05/2021 Cor CTA: LM nl, LAD >70p (FFRct 0.85), LCX 60-69p (FFRct 0.96), RCA non-dom, nl (FFRct 0.88). Cor Ca2+ = 613 (92nd %'ile).   GERD (gastroesophageal reflux disease)    Hiatal hernia    a. 05/2021 Cor CTA: Hiatal hernia incidentally noted.   History of echocardiogram    a. 09/2016 Echo: EF 55-60%, no rmwa; b. 05/2021 Echo: EF 55-60%, no rwma, GrI DD, nl RV fxn, mild MR.   HLD (hyperlipidemia)    HTN (hypertension)    Hypothyroidism    Sleep apnea    uses cpap   Past Surgical History:  Procedure Laterality Date   ABDOMINAL HYSTERECTOMY     BACK SURGERY  11/05/2017   CARPAL TUNNEL RELEASE Right 2008   Dr. Derrel Nip   CARPAL TUNNEL RELEASE Left 11/01/2016   Procedure: CARPAL TUNNEL RELEASE;  Surgeon: Earnestine Leys, MD;  Location: [facility] ORS;  Service: Orthopedics;  Laterality: Left;   CHOLECYSTECTOMY     COLONOSCOPY WITH PROPOFOL N/A 10/03/2019   Procedure: COLONOSCOPY WITH PROPOFOL;  Surgeon: Virgel Manifold, MD;  Location: [facility] ENDOSCOPY;  Service: Endoscopy;  Laterality: N/A;   COLONOSCOPY WITH PROPOFOL N/A 02/21/2022   Procedure: COLONOSCOPY WITH PROPOFOL;  Surgeon: Jonathon Bellows, MD;  Location: [facility] ENDOSCOPY;  Service: Gastroenterology;  Laterality: N/A;   LUMBAR DISC SURGERY  1998   L4 - L5.  no metal in back   TOTAL VAGINAL HYSTERECTOMY  1998    Allergies  Allergies  Allergen Reactions   Codeine Nausea And Vomiting, Other (See Comments) and  Nausea Only    Severe n & v   Sulfa Antibiotics Nausea And Vomiting    Occurred a long time ago so patient is unsure of reaction   Other Other (See Comments)   Sulfasalazine    Prednisone Other (See Comments)    Could not sleep and felt nervous.    History of Present Illness    72 y/o ? w/ a h/o HTN, HL, OSA on CPAP, hypothyroidism, and nonobstructive CAD.  She previously underwent stress testing in 08/2016, which showed no evidence of ischemia or infarct.  EF was 45% and follow-up echo showed an EF of 55 to 60%.  In May 2023, she reported left-sided chest pain.  Coronary CT angiogram showed greater than 70% proximal LAD stenosis and a 60 to 69% proximal left circumflex stenosis.  Coronary calcium was elevated at 613 (92nd percentile).  FFR CT analysis was normal in the LAD (0.85), left circumflex (0.96), and RCA (0.88).  She was placed on long-acting nitrate therapy with improvement in symptoms and activity tolerance, and was doing well when last seen in clinic in July 2023.  She was seen in the emergency department at [facility] in November with abdominal pain and was diagnosed with uncomplicated diverticulitis without perforation.  Labs were notable for mild normocytic anemia with an H&H of 10.6  and 31.6.  She is since undergone colonoscopies, most recently earlier this month.  From a cardiac standpoint, Ms. Dix has been feeling well.  She remains active without symptoms or limitations.  She denies chest pain, dyspnea, palpitations, PND, orthopnea, dizziness, syncope, edema, or early satiety.  Home Medications    Current Outpatient Medications  Medication Sig Dispense Refill   ALPRAZolam (XANAX) 0.5 MG tablet Take 1 tablet (0.5 mg total) by mouth 2 (two) times daily as needed for anxiety. 60 tablet 0   Ascorbic Acid (VITAMIN C PO) Take 1 tablet by mouth daily.     aspirin 81 MG tablet Take 81 mg by mouth daily.     atorvastatin (LIPITOR) 40 MG tablet Take 1 tablet (40 mg total) by  mouth daily. 90 tablet 3   Cholecalciferol (VITAMIN D-3 PO) Take 1 capsule by mouth daily.     FLUoxetine (PROZAC) 20 MG capsule TAKE 1 CAPSULE BY MOUTH  DAILY 90 capsule 3   levothyroxine (SYNTHROID) 75 MCG tablet Take 1 tablet (75 mcg total) by mouth daily. 90 tablet 3   metFORMIN (GLUCOPHAGE) 500 MG tablet Take 500 mg by mouth daily.     nitroGLYCERIN (NITROSTAT) 0.4 MG SL tablet Place 1 tablet (0.4 mg total) under the tongue every 5 (five) minutes as needed for chest pain. 25 tablet 3   omeprazole (PRILOSEC) 20 MG capsule TAKE 1 CAPSULE BY MOUTH  DAILY 90 capsule 3   phentermine (ADIPEX-P) 37.5 MG tablet Take 37.5 mg by mouth daily before breakfast.     phentermine 15 MG capsule Take 37.5 mg by mouth daily.     acetaminophen (TYLENOL) 650 MG CR tablet 1 tab as needed for pain, 2- 3 times/days, 90 days (Patient not taking: Reported on 03/08/2022)     ezetimibe (ZETIA) 10 MG tablet Take 1 tablet (10 mg total) by mouth daily. 90 tablet 0   isosorbide mononitrate (IMDUR) 30 MG 24 hr tablet Take 0.5 tablets (15 mg total) by mouth daily. 45 tablet 0   lisinopril (ZESTRIL) 10 MG tablet TAKE 1 TABLET BY MOUTH  DAILY (Patient not taking: Reported on 03/08/2022) 90 tablet 3   No current facility-administered medications for this visit.     Review of Systems    She denies chest pain, palpitations, dyspnea, pnd, orthopnea, n, v, dizziness, syncope, edema, weight gain, or early satiety.  All other systems reviewed and are otherwise negative except as noted above.    Physical Exam    VS:  BP (!) 150/86 (BP Location: Left Arm, Patient Position: Sitting, Cuff Size: Large)   Pulse 68   Ht '5\' 5"'$  (1.651 m)   Wt 187 lb (84.8 kg)   SpO2 99%   BMI 31.12 kg/m  , BMI Body mass index is 31.12 kg/m.     Vitals:   03/08/22 1116 03/08/22 1239  BP: (!) 150/86 (!) 144/84  Pulse: 68   SpO2: 99%     GEN: Well nourished, well developed, in no acute distress. HEENT: normal. Neck: Supple, no JVD, carotid  bruits, or masses. Cardiac: RRR, no murmurs, rubs, or gallops. No clubbing, cyanosis, edema.  Radials 2+/PT 2+ and equal bilaterally.  Respiratory:  Respirations regular and unlabored, clear to auscultation bilaterally. GI: Soft, nontender, nondistended, BS + x 4. MS: no deformity or atrophy. Skin: warm and dry, no rash. Neuro:  Strength and sensation are intact. Psych: Normal affect.  Accessory Clinical Findings    ECG personally reviewed by me today - RSR, 68 -  no acute changes.  Lab Results  Component Value Date   WBC 6.2 09/05/2021   HGB 12.2 09/05/2021   HCT 37.5 09/05/2021   MCV 93.8 09/05/2021   PLT 125 (L) 09/05/2021   Lab Results  Component Value Date   CREATININE 0.85 09/05/2021   BUN 20 09/05/2021   NA 138 09/05/2021   K 4.3 09/05/2021   CL 104 09/05/2021   CO2 27 09/05/2021   Lab Results  Component Value Date   ALT 22 09/05/2021   AST 21 09/05/2021   GGT 20 05/03/2020   ALKPHOS 110 09/05/2021   BILITOT 1.5 (H) 09/05/2021   Lab Results  Component Value Date   CHOL 121 09/05/2021   HDL 52 09/05/2021   LDLCALC 57 09/05/2021   TRIG 62 09/05/2021   CHOLHDL 2.3 09/05/2021    Lab Results  Component Value Date   TSH 0.018 (L) 05/10/2021    Assessment & Plan    1.  Coronary artery disease/precordial chest pain: Patient with a history of precordial chest pain and nonobstructive diffuse coronary disease on coronary CTA in 2023.  She has been doing well on aspirin, statin, and low-dose long-acting nitrate therapy without symptoms or limitations.  No changes to medications today.  2.  Essential hypertension: Blood pressure is elevated today.  She was previously on lisinopril but this was discontinued secondary to soft blood pressures at home with associated lightheadedness.  Pressure was elevated on 2 readings today.  Given history of symptomatic low blood pressures, will have her check her blood pressures over the next few weeks and contact us with  information.  3.  Hyperlipidemia: LDL of 57 in August 2023.  She remains on statin and Zetia.  4.  Obstructive sleep apnea: Compliant with CPAP.  5.  Normocytic anemia: Labs at [facility] in November showed an H&H of 10.6 and 31.6 in the setting of diverticulitis.  Follow-up CBC today.  Recent GI evaluation with colonoscopy.  6.  Hypothyroidism: On levothyroxine.  As noted last year, TSH was 0.018 in May 2023.  I do not see that this has been repeated.  Strongly encouraged to follow-up with her primary care provider.  7.  Disposition: Follow-up in 6 months or sooner if necessary.  Murray Hodgkins, NP 03/08/2022, 12:17 PM

## 2022-03-08 NOTE — Patient Instructions (Signed)
Medication Instructions:   Your physician recommends that you continue on your current medications as directed. Please refer to the Current Medication list given to you today.  *If you need a refill on your cardiac medications before your next appointment, please call your pharmacy*   Lab Work:  Your physician recommends you go to the medical mall for lab work.   If you have labs (blood work) drawn today and your tests are completely normal, you will receive your results only by: [place] (if you have MyChart) OR A paper copy in the mail If you have any lab test that is abnormal or we need to change your treatment, we will call you to review the results.   Testing/Procedures:  None ordered    Follow-Up: At [facility], you and your health needs are our priority.  As part of our continuing mission to provide you with exceptional heart care, we have created designated Provider Care Teams.  These Care Teams include your primary Cardiologist (physician) and Advanced Practice Providers (APPs -  Physician Assistants and Nurse Practitioners) who all work together to provide you with the care you need, when you need it.  We recommend signing up for the patient portal called "MyChart".  Sign up information is provided on this After Visit Summary.  MyChart is used to connect with patients for Virtual Visits (Telemedicine).  Patients are able to view lab/test results, encounter notes, upcoming appointments, etc.  Non-urgent messages can be sent to your provider as well.   To learn more about what you can do with MyChart, go to [email or web address].    Your next appointment:   6 month(s)  Provider:   You may see Kathlyn Sacramento, MD or one of the following Advanced Practice Providers on your designated Care Team:   Murray Hodgkins, NP Christell Faith, PA-C Cadence Kathlen Mody, PA-C Gerrie Nordmann, NP

## 2022-03-09 ENCOUNTER — Telehealth: Payer: Self-pay | Admitting: Nurse Practitioner

## 2022-03-09 DIAGNOSIS — G4733 Obstructive sleep apnea (adult) (pediatric): Secondary | ICD-10-CM | POA: Diagnosis not present

## 2022-03-09 NOTE — Telephone Encounter (Signed)
Spoke to patient and informed her of Melissa Hodgkins, NP recommendations from lab results: Blood counts improved since November - only mildly abnl H/H.   Upon reviewing older labs, I note that her TSH was very low in 05/2021 and hasn't been repeated since.  Pls encourage her to follow-up with Dr. Lavera Guise as this needs to be re-evaluated, given ongoing levothyroxine therapy.  Patient understood with read back

## 2022-03-09 NOTE — Telephone Encounter (Signed)
Follow Up:      Patient is returning call, concerning her lab results from yesterday.

## 2022-03-16 NOTE — Telephone Encounter (Signed)
We will do a one time refill for her. She need to be seen.

## 2022-03-17 NOTE — Telephone Encounter (Signed)
I called the patient and informed her that the medication Xanax was sent to the pharmacy and he is scheduled to see the provider next week for TOC.  ng

## 2022-03-20 DIAGNOSIS — J029 Acute pharyngitis, unspecified: Secondary | ICD-10-CM | POA: Diagnosis not present

## 2022-03-20 DIAGNOSIS — E079 Disorder of thyroid, unspecified: Secondary | ICD-10-CM | POA: Diagnosis not present

## 2022-03-20 DIAGNOSIS — E039 Hypothyroidism, unspecified: Secondary | ICD-10-CM | POA: Diagnosis not present

## 2022-03-20 DIAGNOSIS — Z885 Allergy status to narcotic agent status: Secondary | ICD-10-CM | POA: Diagnosis not present

## 2022-03-20 DIAGNOSIS — J0110 Acute frontal sinusitis, unspecified: Secondary | ICD-10-CM | POA: Diagnosis not present

## 2022-03-20 DIAGNOSIS — M542 Cervicalgia: Secondary | ICD-10-CM | POA: Diagnosis not present

## 2022-03-20 DIAGNOSIS — E785 Hyperlipidemia, unspecified: Secondary | ICD-10-CM | POA: Diagnosis not present

## 2022-03-20 DIAGNOSIS — Z79899 Other long term (current) drug therapy: Secondary | ICD-10-CM | POA: Diagnosis not present

## 2022-03-20 DIAGNOSIS — Z7951 Long term (current) use of inhaled steroids: Secondary | ICD-10-CM | POA: Diagnosis not present

## 2022-03-20 DIAGNOSIS — Z888 Allergy status to other drugs, medicaments and biological substances status: Secondary | ICD-10-CM | POA: Diagnosis not present

## 2022-03-20 DIAGNOSIS — H539 Unspecified visual disturbance: Secondary | ICD-10-CM | POA: Diagnosis not present

## 2022-03-20 DIAGNOSIS — Z7982 Long term (current) use of aspirin: Secondary | ICD-10-CM | POA: Diagnosis not present

## 2022-03-20 DIAGNOSIS — R0982 Postnasal drip: Secondary | ICD-10-CM | POA: Diagnosis not present

## 2022-03-20 DIAGNOSIS — Z882 Allergy status to sulfonamides status: Secondary | ICD-10-CM | POA: Diagnosis not present

## 2022-03-20 DIAGNOSIS — I10 Essential (primary) hypertension: Secondary | ICD-10-CM | POA: Diagnosis not present

## 2022-03-20 DIAGNOSIS — R519 Headache, unspecified: Secondary | ICD-10-CM | POA: Diagnosis not present

## 2022-03-20 DIAGNOSIS — I2510 Atherosclerotic heart disease of native coronary artery without angina pectoris: Secondary | ICD-10-CM | POA: Diagnosis not present

## 2022-03-20 DIAGNOSIS — J069 Acute upper respiratory infection, unspecified: Secondary | ICD-10-CM | POA: Diagnosis not present

## 2022-03-21 DIAGNOSIS — R519 Headache, unspecified: Secondary | ICD-10-CM | POA: Diagnosis not present

## 2022-03-24 ENCOUNTER — Other Ambulatory Visit (INDEPENDENT_AMBULATORY_CARE_PROVIDER_SITE_OTHER): Payer: Medicare Other

## 2022-03-24 ENCOUNTER — Ambulatory Visit (INDEPENDENT_AMBULATORY_CARE_PROVIDER_SITE_OTHER): Payer: Medicare Other | Admitting: Nurse Practitioner

## 2022-03-24 ENCOUNTER — Telehealth: Payer: Self-pay

## 2022-03-24 VITALS — BP 105/60 | HR 65 | Temp 98.1°F | Ht 65.0 in | Wt 184.6 lb

## 2022-03-24 DIAGNOSIS — I2510 Atherosclerotic heart disease of native coronary artery without angina pectoris: Secondary | ICD-10-CM | POA: Diagnosis not present

## 2022-03-24 DIAGNOSIS — E063 Autoimmune thyroiditis: Secondary | ICD-10-CM

## 2022-03-24 DIAGNOSIS — K219 Gastro-esophageal reflux disease without esophagitis: Secondary | ICD-10-CM

## 2022-03-24 DIAGNOSIS — F419 Anxiety disorder, unspecified: Secondary | ICD-10-CM

## 2022-03-24 DIAGNOSIS — E038 Other specified hypothyroidism: Secondary | ICD-10-CM

## 2022-03-24 DIAGNOSIS — E6609 Other obesity due to excess calories: Secondary | ICD-10-CM

## 2022-03-24 DIAGNOSIS — E7800 Pure hypercholesterolemia, unspecified: Secondary | ICD-10-CM | POA: Diagnosis not present

## 2022-03-24 DIAGNOSIS — I10 Essential (primary) hypertension: Secondary | ICD-10-CM

## 2022-03-24 DIAGNOSIS — E2839 Other primary ovarian failure: Secondary | ICD-10-CM | POA: Diagnosis not present

## 2022-03-24 DIAGNOSIS — Z6832 Body mass index (BMI) 32.0-32.9, adult: Secondary | ICD-10-CM

## 2022-03-24 LAB — T4, FREE: Free T4: 1.03 ng/dL (ref 0.60–1.60)

## 2022-03-24 LAB — TSH: TSH: 6.36 u[IU]/mL — ABNORMAL HIGH (ref 0.35–5.50)

## 2022-03-24 MED ORDER — ALBUTEROL SULFATE (2.5 MG/3ML) 0.083% IN NEBU: 2.5000 mg | INHALATION_SOLUTION | Freq: Four times a day (QID) | RESPIRATORY_TRACT | 1 refills | Status: AC | PRN

## 2022-03-24 NOTE — Progress Notes (Unsigned)
Established Patient Office Visit  Subjective:  Patient ID: Melissa English, female    DOB: Jan 05, 1951  Age: 72 y.o. MRN: [id number]  CC:  Chief Complaint  Patient presents with   Transitions Of Care    HPI  Melissa English presents for: Followed by cardiologist and weight management at [facility] [address], Deirdre Priest, [facility] [place] [facility].  HPI   Past Medical History:  Diagnosis Date   Anxiety    Arthritis 2018   hands, back...getting injections for back   CAD (coronary artery disease)    a. 08/2016 MV: EF 45%, no ischemia (EF 55-60% by echo); b. 05/2021 Cor CTA: LM nl, LAD >70p (FFRct 0.85), LCX 60-69p (FFRct 0.96), RCA non-dom, nl (FFRct 0.88). Cor Ca2+ = 613 (92nd %'ile).   GERD (gastroesophageal reflux disease)    Hiatal hernia    a. 05/2021 Cor CTA: Hiatal hernia incidentally noted.   History of echocardiogram    a. 09/2016 Echo: EF 55-60%, no rmwa; b. 05/2021 Echo: EF 55-60%, no rwma, GrI DD, nl RV fxn, mild MR.   HLD (hyperlipidemia)    HTN (hypertension)    Hypothyroidism    Sleep apnea    uses cpap    Past Surgical History:  Procedure Laterality Date   ABDOMINAL HYSTERECTOMY     BACK SURGERY  11/05/2017   CARPAL TUNNEL RELEASE Right 2008   Dr. Derrel Nip   CARPAL TUNNEL RELEASE Left 11/01/2016   Procedure: CARPAL TUNNEL RELEASE;  Surgeon: Earnestine Leys, MD;  Location: [facility] ORS;  Service: Orthopedics;  Laterality: Left;   CHOLECYSTECTOMY     COLONOSCOPY WITH PROPOFOL N/A 10/03/2019   Procedure: COLONOSCOPY WITH PROPOFOL;  Surgeon: Virgel Manifold, MD;  Location: [facility] ENDOSCOPY;  Service: Endoscopy;  Laterality: N/A;   COLONOSCOPY WITH PROPOFOL N/A 02/21/2022   Procedure: COLONOSCOPY WITH PROPOFOL;  Surgeon: Jonathon Bellows, MD;  Location: [facility] ENDOSCOPY;  Service: Gastroenterology;  Laterality: N/A;   LUMBAR DISC SURGERY  1998   L4 - L5.  no metal in back   TOTAL VAGINAL HYSTERECTOMY  1998    Family History  Problem Relation Age of Onset   Breast cancer Neg Hx     Social  History   Socioeconomic History   Marital status: Married    Spouse name: Not on file   Number of children: 3   Years of education: Not on file   Highest education level: Not on file  Occupational History   Occupation: housewife  Tobacco Use   Smoking status: Never   Smokeless tobacco: Never  Vaping Use   Vaping Use: Never used  Substance and Sexual Activity   Alcohol use: Yes    Comment: occasional wine or beer   Drug use: No   Sexual activity: Not on file  Other Topics Concern   Not on file  Social History Narrative   Not on file   Social Determinants of Health   Financial Resource Strain: Low Risk  (12/12/2021)   Overall Financial Resource Strain (CARDIA)    Difficulty of Paying Living Expenses: Not hard at all  Food Insecurity: No Food Insecurity (12/12/2021)   Hunger Vital Sign    Worried About Running Out of Food in the Last Year: Never true    [place] in the Last Year: Never true  Transportation Needs: No Transportation Needs (12/12/2021)   PRAPARE - Hydrologist (Medical): No    Lack of Transportation (Non-Medical): No  Physical Activity:  Sufficiently Active (12/12/2021)   Exercise Vital Sign    Days of Exercise per Week: 2 days    Minutes of Exercise per Session: 90 min  Stress: No Stress Concern Present (12/12/2021)   [place]    Feeling of Stress : Not at all  Social Connections: Moderately Integrated (12/12/2021)   Social Connection and Isolation Panel [NHANES]    Frequency of Communication with Friends and Family: More than three times a week    Frequency of Social Gatherings with Friends and Family: Once a week    Attends Religious Services: More than 4 times per year    Active Member of [employer] or Organizations: No    Attends Archivist Meetings: Never    Marital Status: Married  Human resources officer Violence: Not At Risk (12/12/2021)   Humiliation,  Afraid, Rape, and Kick questionnaire    Fear of Current or Ex-Partner: No    Emotionally Abused: No    Physically Abused: No    Sexually Abused: No     Outpatient Medications Prior to Visit  Medication Sig Dispense Refill   ALPRAZolam (XANAX) 0.5 MG tablet TAKE 1 TABLET BY MOUTH TWICE  DAILY AS NEEDED FOR ANXIETY 60 tablet 0   Ascorbic Acid (VITAMIN C PO) Take 1 tablet by mouth daily.     aspirin 81 MG tablet Take 81 mg by mouth daily.     atorvastatin (LIPITOR) 40 MG tablet Take 1 tablet (40 mg total) by mouth daily. 90 tablet 3   Cholecalciferol (VITAMIN D-3 PO) Take 1 capsule by mouth daily.     ezetimibe (ZETIA) 10 MG tablet Take 1 tablet (10 mg total) by mouth daily. 90 tablet 0   FLUoxetine (PROZAC) 20 MG capsule TAKE 1 CAPSULE BY MOUTH  DAILY 90 capsule 3   isosorbide mononitrate (IMDUR) 30 MG 24 hr tablet Take 0.5 tablets (15 mg total) by mouth daily. 45 tablet 0   levothyroxine (SYNTHROID) 75 MCG tablet Take 1 tablet (75 mcg total) by mouth daily. 90 tablet 3   lisinopril (ZESTRIL) 10 MG tablet TAKE 1 TABLET BY MOUTH  DAILY 90 tablet 3   metFORMIN (GLUCOPHAGE) 500 MG tablet Take 500 mg by mouth daily.     nitroGLYCERIN (NITROSTAT) 0.4 MG SL tablet Place 1 tablet (0.4 mg total) under the tongue every 5 (five) minutes as needed for chest pain. 25 tablet 3   omeprazole (PRILOSEC) 20 MG capsule TAKE 1 CAPSULE BY MOUTH  DAILY 90 capsule 3   phentermine (ADIPEX-P) 37.5 MG tablet Take 37.5 mg by mouth daily before breakfast.     phentermine 15 MG capsule Take 37.5 mg by mouth daily.     acetaminophen (TYLENOL) 650 MG CR tablet 1 tab as needed for pain, 2- 3 times/days, 90 days (Patient not taking: Reported on 03/24/2022)     No facility-administered medications prior to visit.    Allergies  Allergen Reactions   Codeine Nausea And Vomiting, Other (See Comments) and Nausea Only    Severe n & v   Sulfa Antibiotics Nausea And Vomiting    Occurred a long time ago so patient is unsure  of reaction   Other Other (See Comments)   Sulfasalazine    Prednisone Other (See Comments)    Could not sleep and felt nervous.    ROS Review of Systems  Constitutional: Negative.   HENT: Negative.    Eyes: Negative.   Respiratory:  Positive for cough and  shortness of breath.   Cardiovascular: Negative.   Gastrointestinal: Negative.   Genitourinary: Negative.   Musculoskeletal: Negative.   Neurological: Negative.   Psychiatric/Behavioral: Negative.        Objective:    Physical Exam Constitutional:      Appearance: Normal appearance. She is obese.  HENT:     Head: Normocephalic.     Right Ear: Tympanic membrane normal.     Left Ear: Tympanic membrane normal.     Nose: Nose normal.     Mouth/Throat:     Mouth: Mucous membranes are moist.     Pharynx: Oropharynx is clear.  Eyes:     Extraocular Movements: Extraocular movements intact.     Conjunctiva/sclera: Conjunctivae normal.     Pupils: Pupils are equal, round, and reactive to light.  Cardiovascular:     Rate and Rhythm: Normal rate and regular rhythm.     Pulses: Normal pulses.     Heart sounds: Normal heart sounds.  Pulmonary:     Effort: Pulmonary effort is normal. No respiratory distress.     Breath sounds: Normal breath sounds. No rhonchi.  Abdominal:     General: Bowel sounds are normal.     Palpations: Abdomen is soft. There is no mass.     Tenderness: There is no abdominal tenderness.     Hernia: No hernia is present.  Musculoskeletal:        General: Normal range of motion.     Cervical back: Neck supple. No tenderness.  Skin:    General: Skin is warm.     Capillary Refill: Capillary refill takes less than 2 seconds.  Neurological:     General: No focal deficit present.     Mental Status: She is alert and oriented to person, place, and time. Mental status is at baseline.  Psychiatric:        Mood and Affect: Mood normal.        Behavior: Behavior normal.        Thought Content: Thought  content normal.        Judgment: Judgment normal.     BP 105/60   Pulse 65   Temp 98.1 F (36.7 C) (Oral)   Ht 5\' 5"  (1.651 m)   Wt 184 lb 9.6 oz (83.7 kg)   SpO2 97%   BMI 30.72 kg/m  Wt Readings from Last 3 Encounters:  03/24/22 184 lb 9.6 oz (83.7 kg)  03/08/22 187 lb (84.8 kg)  02/21/22 179 lb (81.2 kg)     Health Maintenance  Topic Date Due   DTaP/Tdap/Td (1 - Tdap) Never done   DEXA SCAN  Never done   COVID-19 Vaccine (3 - Moderna risk series) 04/09/2022 (Originally 04/30/2019)   Medicare Annual Wellness ([place])  12/13/2022   MAMMOGRAM  03/08/2023   COLONOSCOPY (Pts 45-39yrs Insurance coverage will need to be confirmed)  02/22/2024   Pneumonia Vaccine 42+ Years old  Completed   INFLUENZA VACCINE  Completed   Hepatitis C Screening  Completed   Zoster Vaccines- Shingrix  Completed   HPV VACCINES  Aged Out    There are no preventive care reminders to display for this patient.  Lab Results  Component Value Date   TSH 0.018 (L) 05/10/2021   Lab Results  Component Value Date   WBC 7.2 03/08/2022   HGB 11.2 (L) 03/08/2022   HCT 35.2 (L) 03/08/2022   MCV 93.4 03/08/2022   PLT 195 03/08/2022   Lab Results  Component Value Date  NA 138 09/05/2021   K 4.3 09/05/2021   CO2 27 09/05/2021   GLUCOSE 111 (H) 09/05/2021   BUN 20 09/05/2021   CREATININE 0.85 09/05/2021   BILITOT 1.5 (H) 09/05/2021   ALKPHOS 110 09/05/2021   AST 21 09/05/2021   ALT 22 09/05/2021   PROT 7.3 09/05/2021   ALBUMIN 4.2 09/05/2021   CALCIUM 9.3 09/05/2021   ANIONGAP 7 09/05/2021   EGFR 85 11/03/2020   Lab Results  Component Value Date   CHOL 121 09/05/2021   Lab Results  Component Value Date   HDL 52 09/05/2021   Lab Results  Component Value Date   LDLCALC 57 09/05/2021   Lab Results  Component Value Date   TRIG 62 09/05/2021   Lab Results  Component Value Date   CHOLHDL 2.3 09/05/2021   No results found for: "HGBA1C"    Assessment & Plan:  Primary  hypertension  Hypothyroidism due to Hashimoto's thyroiditis  Gastroesophageal reflux disease without esophagitis  Estrogen deficiency -     HM DEXA SCAN; Future    Follow-up: No follow-ups on file.   Theresia Lo, NP

## 2022-03-24 NOTE — Patient Instructions (Addendum)
Will will check TSH today. Dexa scan ordered Sent the albuterol nebulizer solution. Follow up in 4 months.

## 2022-03-24 NOTE — Telephone Encounter (Signed)
I changed one of test to our lab to be added on. Not sure if the other can be added since it goes to [facility]. If it can not be added, they will notify me, & I will notify you.

## 2022-03-25 LAB — T3: T3, Total: 80 ng/dL (ref 71–180)

## 2022-03-25 NOTE — Telephone Encounter (Signed)
Fine, thank you

## 2022-03-26 ENCOUNTER — Other Ambulatory Visit: Payer: Self-pay | Admitting: Nurse Practitioner

## 2022-03-26 ENCOUNTER — Encounter: Payer: Self-pay | Admitting: Nurse Practitioner

## 2022-03-26 DIAGNOSIS — I2510 Atherosclerotic heart disease of native coronary artery without angina pectoris: Secondary | ICD-10-CM | POA: Insufficient documentation

## 2022-03-26 NOTE — Assessment & Plan Note (Addendum)
Patient BP  Vitals:   03/24/22 0906  BP: 105/60    in the office. Advised pt to follow a low sodium and heart healthy diet. Continue lisinopril 10 mg daily.

## 2022-03-26 NOTE — Assessment & Plan Note (Signed)
Previous TSH 0.018 on 05/10/2021. Continue levothyroxine 75 mcg. Will check TSH today.

## 2022-03-26 NOTE — Assessment & Plan Note (Signed)
Followed by cardiologist Dr. Fletcher Anon. On Imdur and nitroglycerin as needed.

## 2022-03-26 NOTE — Assessment & Plan Note (Signed)
Stable at present. Continue Prozac 20 mg and Xanax as needed.

## 2022-03-26 NOTE — Assessment & Plan Note (Signed)
Cholesterol 121, trigs 62, LDL 57 on 09/05/2021 Continue statin therapy for cardiovascular risk reduction.

## 2022-03-26 NOTE — Assessment & Plan Note (Signed)
Body mass index is 30.72 kg/m. She is followed by weight management Dr. Deirdre Priest, [facility]. She is on phentermine and metformin for weight management.

## 2022-03-29 ENCOUNTER — Telehealth: Payer: Self-pay | Admitting: Cardiovascular Disease

## 2022-03-29 NOTE — Telephone Encounter (Signed)
Pt was would a callback regarding getting provider to approve and prescribe her lisinopril (ZESTRIL) 10 MG tablet moving forward because she forgot to mention at her last office visit on 03/08/22. Please advise.

## 2022-03-29 NOTE — Telephone Encounter (Signed)
Pt is calling about lab work results and her medication refill on SYNTHROID

## 2022-03-31 ENCOUNTER — Other Ambulatory Visit: Payer: Self-pay | Admitting: Nurse Practitioner

## 2022-03-31 DIAGNOSIS — R7989 Other specified abnormal findings of blood chemistry: Secondary | ICD-10-CM

## 2022-03-31 MED ORDER — LISINOPRIL 10 MG PO TABS: 10.0000 mg | ORAL_TABLET | Freq: Every day | ORAL | 3 refills | Status: DC

## 2022-03-31 NOTE — Progress Notes (Signed)
TSH is slightly elevated but T3 and T4 are normal. No change in medication at present and we will repeat TSH in 6 weeks.

## 2022-03-31 NOTE — Telephone Encounter (Signed)
Lisinopril has been sent to [facility] per her request.

## 2022-03-31 NOTE — Telephone Encounter (Signed)
Yes that is fine

## 2022-03-31 NOTE — Telephone Encounter (Signed)
Pt has seen results on mychart, but has not heard from you about her results. Please send results to pool.

## 2022-04-08 DIAGNOSIS — J329 Chronic sinusitis, unspecified: Secondary | ICD-10-CM | POA: Diagnosis not present

## 2022-04-08 DIAGNOSIS — R519 Headache, unspecified: Secondary | ICD-10-CM | POA: Diagnosis not present

## 2022-04-08 DIAGNOSIS — G4733 Obstructive sleep apnea (adult) (pediatric): Secondary | ICD-10-CM | POA: Diagnosis not present

## 2022-04-08 DIAGNOSIS — H6121 Impacted cerumen, right ear: Secondary | ICD-10-CM | POA: Diagnosis not present

## 2022-04-11 ENCOUNTER — Telehealth: Payer: Self-pay | Admitting: Nurse Practitioner

## 2022-04-11 NOTE — Telephone Encounter (Signed)
Pt daughter called wanting the cma to call her regarding an upcoming visit and imaging being done. Pt daughter stated pt has pain in her neck and she was seen at urgent care and ER. Pt daughter stated the ER did a CT scan of her head and it was normal but pt still have constant so she went to [facility] staturday and was given a shot of toradol. Pt daughter stated pt is depressed and mood is up and down

## 2022-04-11 NOTE — Telephone Encounter (Signed)
Patient has ben pain in the back of her head, on the right side with vision changes. Patient went to UC at [facility] on 03/20/2022. Was sent to ER at [facility] was given shot of Toradol and an antibiotic for sinus. Patient is still having pain  became severe on Saturday and the ER advised due to CT being normal that wanted to pursue an MRI for this pain which is located near the mastoid bone. Patient daughter says that this has been going on for the last year and gradually worsening, daughter also reports patient is very depressed. Daughter states her moods go from normal to to angry feeling no one cares about her and then she becomes depressed to the point she does not want to get out of bed. Daughter plans to come with Patient to visit on 04/13/22 would like to speak with PCP before speaking with patient.There are notes in care everywhere  for [facility] visits on 03/20/22/ and 04/08/22 and note from [facility] on 03/20/22.

## 2022-04-13 ENCOUNTER — Telehealth: Payer: Self-pay

## 2022-04-13 ENCOUNTER — Encounter: Payer: Self-pay | Admitting: Nurse Practitioner

## 2022-04-13 ENCOUNTER — Telehealth: Payer: Self-pay | Admitting: Nurse Practitioner

## 2022-04-13 ENCOUNTER — Ambulatory Visit (INDEPENDENT_AMBULATORY_CARE_PROVIDER_SITE_OTHER): Payer: Medicare Other | Admitting: Nurse Practitioner

## 2022-04-13 VITALS — BP 118/80 | HR 67 | Temp 97.4°F | Ht 65.0 in | Wt 182.2 lb

## 2022-04-13 DIAGNOSIS — G4489 Other headache syndrome: Secondary | ICD-10-CM

## 2022-04-13 DIAGNOSIS — F339 Major depressive disorder, recurrent, unspecified: Secondary | ICD-10-CM

## 2022-04-13 DIAGNOSIS — H539 Unspecified visual disturbance: Secondary | ICD-10-CM

## 2022-04-13 DIAGNOSIS — E119 Type 2 diabetes mellitus without complications: Secondary | ICD-10-CM | POA: Diagnosis not present

## 2022-04-13 DIAGNOSIS — Z13820 Encounter for screening for osteoporosis: Secondary | ICD-10-CM

## 2022-04-13 MED ORDER — SUMATRIPTAN SUCCINATE 25 MG PO TABS: 25.0000 mg | ORAL_TABLET | Freq: Three times a day (TID) | ORAL | 0 refills | Status: DC | PRN

## 2022-04-13 MED ORDER — FLUOXETINE HCL 40 MG PO CAPS: 40.0000 mg | ORAL_CAPSULE | Freq: Every day | ORAL | 3 refills | Status: AC

## 2022-04-13 NOTE — Telephone Encounter (Signed)
Pt returned both previous call. Note below was read to pt regarding her rx. Pt aware. About her MRI, call was transferred to [facility].

## 2022-04-13 NOTE — Telephone Encounter (Signed)
[facility]

## 2022-04-13 NOTE — Telephone Encounter (Signed)
Patient states Melissa Lo, NP, mentioned prescribing some medication for patient's migraines.  Patient states she would like to know which medication we will be giving her for migraines.  [facility] [place].  Patient states she will be out and about today, so we may call her on her cell phone.  I spoke with Jeralyn Bennett, CMA, and she states she will find out which medication patient will be taking and let her know.

## 2022-04-13 NOTE — Patient Instructions (Addendum)
Increased Prozac to 40 mg. Referral sent to Psych. MRI ordered. Schedule an appointment with  your ENT. Appointment with [facility] has been scheduled on

## 2022-04-13 NOTE — Telephone Encounter (Signed)
Lft pt vm to call ofc to sch MRI. thanks 

## 2022-04-13 NOTE — Progress Notes (Signed)
Established Patient Office Visit  Subjective:  Patient ID: Melissa English, female    DOB: 05/28/1950  Age: 72 y.o. MRN: [id number]  CC:  Chief Complaint  Patient presents with   Acute Visit    Left side neck pain & numbness Left eye vision changes Dizziness Low BP readings     HPI  Melissa English is accompanied with her daughter. She presents for right neck pain, numbness, blurry vision  and low blood pressure off and on. She was seen in the ED  on March 21, 2022 due to headache, vision changes and right-sided face tingling. The CT was reassuring.   She complaint of right sided eye pressure and pain with  pressure in the right temple and back of the head.   She has not been seen by the ophthalmology after she has that pain.   Low blood pressure readings at home. Doing better now .  HPI   Past Medical History:  Diagnosis Date   Anxiety    Arthritis 2018   hands, back...getting injections for back   CAD (coronary artery disease)    a. 08/2016 MV: EF 45%, no ischemia (EF 55-60% by echo); b. 05/2021 Cor CTA: LM nl, LAD >70p (FFRct 0.85), LCX 60-69p (FFRct 0.96), RCA non-dom, nl (FFRct 0.88). Cor Ca2+ = 613 (92nd %'ile).   GERD (gastroesophageal reflux disease)    Hiatal hernia    a. 05/2021 Cor CTA: Hiatal hernia incidentally noted.   History of echocardiogram    a. 09/2016 Echo: EF 55-60%, no rmwa; b. 05/2021 Echo: EF 55-60%, no rwma, GrI DD, nl RV fxn, mild MR.   HLD (hyperlipidemia)    HTN (hypertension)    Hypothyroidism    Sleep apnea    uses cpap    Past Surgical History:  Procedure Laterality Date   ABDOMINAL HYSTERECTOMY     BACK SURGERY  11/05/2017   CARPAL TUNNEL RELEASE Right 2008   Dr. Kennith Center   CARPAL TUNNEL RELEASE Left 11/01/2016   Procedure: CARPAL TUNNEL RELEASE;  Surgeon: Deeann Saint, MD;  Location: [facility] ORS;  Service: Orthopedics;  Laterality: Left;   CHOLECYSTECTOMY     COLONOSCOPY WITH PROPOFOL N/A 10/03/2019   Procedure: COLONOSCOPY WITH PROPOFOL;   Surgeon: Pasty Spillers, MD;  Location: [facility] ENDOSCOPY;  Service: Endoscopy;  Laterality: N/A;   COLONOSCOPY WITH PROPOFOL N/A 02/21/2022   Procedure: COLONOSCOPY WITH PROPOFOL;  Surgeon: Wyline Mood, MD;  Location: [facility] ENDOSCOPY;  Service: Gastroenterology;  Laterality: N/A;   LUMBAR DISC SURGERY  1998   L4 - L5.  no metal in back   TOTAL VAGINAL HYSTERECTOMY  1998    Family History  Problem Relation Age of Onset   Breast cancer Neg Hx     Social History   Socioeconomic History   Marital status: Married    Spouse name: Not on file   Number of children: 3   Years of education: Not on file   Highest education level: Not on file  Occupational History   Occupation: housewife  Tobacco Use   Smoking status: Never   Smokeless tobacco: Never  Vaping Use   Vaping Use: Never used  Substance and Sexual Activity   Alcohol use: Yes    Comment: occasional wine or beer   Drug use: No   Sexual activity: Not on file  Other Topics Concern   Not on file  Social History Narrative   Not on file   Social Determinants of Health   Financial  Resource Strain: Low Risk  (12/12/2021)   Overall Financial Resource Strain (CARDIA)    Difficulty of Paying Living Expenses: Not hard at all  Food Insecurity: No Food Insecurity (12/12/2021)   Hunger Vital Sign    Worried About Running Out of Food in the Last Year: Never true    Ran Out of Food in the Last Year: Never true  Transportation Needs: No Transportation Needs (12/12/2021)   PRAPARE - Administrator, Civil Service (Medical): No    Lack of Transportation (Non-Medical): No  Physical Activity: Sufficiently Active (12/12/2021)   Exercise Vital Sign    Days of Exercise per Week: 2 days    Minutes of Exercise per Session: 90 min  Stress: No Stress Concern Present (12/12/2021)   [employer] of Occupational Health - Occupational Stress Questionnaire    Feeling of Stress : Not at all  Social Connections: Moderately Integrated  (12/12/2021)   Social Connection and Isolation Panel [NHANES]    Frequency of Communication with Friends and Family: More than three times a week    Frequency of Social Gatherings with Friends and Family: Once a week    Attends Religious Services: More than 4 times per year    Active Member of [employer] or Organizations: No    Attends Banker Meetings: Never    Marital Status: Married  Catering manager Violence: Not At Risk (12/12/2021)   Humiliation, Afraid, Rape, and Kick questionnaire    Fear of Current or Ex-Partner: No    Emotionally Abused: No    Physically Abused: No    Sexually Abused: No     Outpatient Medications Prior to Visit  Medication Sig Dispense Refill   albuterol (PROVENTIL) (2.5 MG/3ML) 0.083% nebulizer solution Take 3 mLs (2.5 mg total) by nebulization every 6 (six) hours as needed for wheezing or shortness of breath. 150 mL 1   ALPRAZolam (XANAX) 0.5 MG tablet TAKE 1 TABLET BY MOUTH TWICE  DAILY AS NEEDED FOR ANXIETY 60 tablet 0   Ascorbic Acid (VITAMIN C PO) Take 1 tablet by mouth daily.     aspirin 81 MG tablet Take 81 mg by mouth daily.     atorvastatin (LIPITOR) 40 MG tablet TAKE 1 TABLET BY MOUTH DAILY 90 tablet 3   Cholecalciferol (VITAMIN D-3 PO) Take 1 capsule by mouth daily.     ezetimibe (ZETIA) 10 MG tablet Take 1 tablet (10 mg total) by mouth daily. 90 tablet 0   levothyroxine (SYNTHROID) 75 MCG tablet Take 1 tablet (75 mcg total) by mouth daily. 90 tablet 3   lisinopril (ZESTRIL) 10 MG tablet Take 1 tablet (10 mg total) by mouth daily. 90 tablet 3   metFORMIN (GLUCOPHAGE) 500 MG tablet Take 500 mg by mouth daily.     nitroGLYCERIN (NITROSTAT) 0.4 MG SL tablet Place 1 tablet (0.4 mg total) under the tongue every 5 (five) minutes as needed for chest pain. 25 tablet 3   omeprazole (PRILOSEC) 20 MG capsule TAKE 1 CAPSULE BY MOUTH  DAILY 90 capsule 3   phentermine (ADIPEX-P) 37.5 MG tablet Take 37.5 mg by mouth daily before breakfast.      phentermine 15 MG capsule Take 37.5 mg by mouth daily.     FLUoxetine (PROZAC) 20 MG capsule TAKE 1 CAPSULE BY MOUTH  DAILY 90 capsule 3   isosorbide mononitrate (IMDUR) 30 MG 24 hr tablet Take 0.5 tablets (15 mg total) by mouth daily. 45 tablet 0   No facility-administered medications prior to  visit.    Allergies  Allergen Reactions   Codeine Nausea And Vomiting, Other (See Comments) and Nausea Only    Severe n & v   Sulfa Antibiotics Nausea And Vomiting    Occurred a long time ago so patient is unsure of reaction   Other Other (See Comments)   Sulfasalazine    Prednisone Other (See Comments)    Could not sleep and felt nervous.    ROS Review of Systems  Constitutional: Negative.   HENT: Negative.    Eyes:  Positive for visual disturbance.  Respiratory:  Negative for cough.   Cardiovascular:  Negative for leg swelling.  Musculoskeletal:  Positive for neck pain.  Neurological:  Positive for dizziness, numbness and headaches.  Psychiatric/Behavioral:  The patient is nervous/anxious.       Objective:    Physical Exam Constitutional:      Appearance: Normal appearance.  HENT:     Head: Normocephalic.     Right Ear: Tympanic membrane normal.     Left Ear: Tympanic membrane normal.     Nose: Nose normal.     Mouth/Throat:     Mouth: Mucous membranes are moist.     Pharynx: Oropharynx is clear.  Eyes:     Extraocular Movements: Extraocular movements intact.     Conjunctiva/sclera: Conjunctivae normal.     Pupils: Pupils are equal, round, and reactive to light.  Cardiovascular:     Rate and Rhythm: Normal rate and regular rhythm.     Pulses: Normal pulses.     Heart sounds: Normal heart sounds.  Pulmonary:     Effort: Pulmonary effort is normal. No respiratory distress.     Breath sounds: Normal breath sounds. No rhonchi.  Musculoskeletal:        General: Normal range of motion.     Cervical back: Neck supple. No tenderness.  Skin:    General: Skin is warm.      Capillary Refill: Capillary refill takes less than 2 seconds.  Neurological:     General: No focal deficit present.     Mental Status: She is alert and oriented to person, place, and time. Mental status is at baseline.  Psychiatric:        Mood and Affect: Mood normal.        Behavior: Behavior normal.        Thought Content: Thought content normal.        Judgment: Judgment normal.     BP 118/80   Pulse 67   Temp (!) 97.4 F (36.3 C)   Ht 5\' 5"  (1.651 m)   Wt 182 lb 3.2 oz (82.6 kg)   SpO2 97%   BMI 30.32 kg/m  Wt Readings from Last 3 Encounters:  04/13/22 182 lb 3.2 oz (82.6 kg)  03/24/22 184 lb 9.6 oz (83.7 kg)  03/08/22 187 lb (84.8 kg)     Health Maintenance  Topic Date Due   DTaP/Tdap/Td (1 - Tdap) Never done   DEXA SCAN  Never done   COVID-19 Vaccine (3 - Moderna risk series) 04/29/2022 (Originally 04/30/2019)   INFLUENZA VACCINE  08/10/2022   Medicare Annual Wellness (AWV)  12/13/2022   MAMMOGRAM  03/08/2023   COLONOSCOPY (Pts 45-60yrs Insurance coverage will need to be confirmed)  02/22/2024   Pneumonia Vaccine 47+ Years old  Completed   Hepatitis C Screening  Completed   Zoster Vaccines- Shingrix  Completed   HPV VACCINES  Aged Out    There are no preventive care  reminders to display for this patient.  Lab Results  Component Value Date   TSH 6.36 (H) 03/24/2022   Lab Results  Component Value Date   WBC 7.2 03/08/2022   HGB 11.2 (L) 03/08/2022   HCT 35.2 (L) 03/08/2022   MCV 93.4 03/08/2022   PLT 195 03/08/2022   Lab Results  Component Value Date   NA 138 09/05/2021   K 4.3 09/05/2021   CO2 27 09/05/2021   GLUCOSE 111 (H) 09/05/2021   BUN 20 09/05/2021   CREATININE 0.85 09/05/2021   BILITOT 1.5 (H) 09/05/2021   ALKPHOS 110 09/05/2021   AST 21 09/05/2021   ALT 22 09/05/2021   PROT 7.3 09/05/2021   ALBUMIN 4.2 09/05/2021   CALCIUM 9.3 09/05/2021   ANIONGAP 7 09/05/2021   EGFR 85 11/03/2020   Lab Results  Component Value Date   CHOL  121 09/05/2021   Lab Results  Component Value Date   HDL 52 09/05/2021   Lab Results  Component Value Date   LDLCALC 57 09/05/2021   Lab Results  Component Value Date   TRIG 62 09/05/2021   Lab Results  Component Value Date   CHOLHDL 2.3 09/05/2021   No results found for: "HGBA1C"    Assessment & Plan:  Other headache syndrome Assessment & Plan: She has a history of migraine the past. Started her on Imitrex. MRI ordered.  Orders: -     MR BRAIN W WO CONTRAST  Changes in vision Assessment & Plan: She has recently noticed recently. We called and scheduled for appointment with the ophthalmologist. MRI ordered.  Orders: -     MR BRAIN W WO CONTRAST  Depression, recurrent Assessment & Plan: Advised patient to increase the dose of fluoxetine to 40 mg daily. Referral sent to psych.  Orders: -     Ambulatory referral to Psychiatry  Other orders -     FLUoxetine HCl; Take 1 capsule (40 mg total) by mouth daily.  Dispense: 90 capsule; Refill: 3 -     SUMAtriptan Succinate; Take 1 tablet (25 mg total) by mouth every 8 (eight) hours as needed for migraine.  Dispense: 10 tablet; Refill: 0    Follow-up: No follow-ups on file.   Kara Dies, NP

## 2022-04-13 NOTE — Telephone Encounter (Signed)
Called Pharmacy and Patient to let her know

## 2022-04-13 NOTE — Telephone Encounter (Signed)
[facility] and also called the Patient to let her know that we are waiting to hear from the Pharmacy.

## 2022-04-18 ENCOUNTER — Other Ambulatory Visit: Payer: Self-pay

## 2022-04-18 MED ORDER — ISOSORBIDE MONONITRATE ER 30 MG PO TB24: 15.0000 mg | ORAL_TABLET | Freq: Every day | ORAL | 3 refills | Status: DC

## 2022-04-20 ENCOUNTER — Ambulatory Visit
Admission: RE | Admit: 2022-04-20 | Discharge: 2022-04-20 | Disposition: A | Discharge status: Home / Self Care | Payer: Medicare Other | Source: Ambulatory Visit | Attending: Nurse Practitioner | Admitting: Nurse Practitioner

## 2022-04-20 DIAGNOSIS — G9389 Other specified disorders of brain: Secondary | ICD-10-CM | POA: Diagnosis not present

## 2022-04-20 DIAGNOSIS — R519 Headache, unspecified: Secondary | ICD-10-CM | POA: Diagnosis not present

## 2022-04-20 DIAGNOSIS — R200 Anesthesia of skin: Secondary | ICD-10-CM | POA: Diagnosis not present

## 2022-04-20 DIAGNOSIS — H538 Other visual disturbances: Secondary | ICD-10-CM | POA: Diagnosis not present

## 2022-04-20 DIAGNOSIS — H539 Unspecified visual disturbance: Secondary | ICD-10-CM | POA: Insufficient documentation

## 2022-04-20 DIAGNOSIS — G4489 Other headache syndrome: Secondary | ICD-10-CM | POA: Insufficient documentation

## 2022-04-20 MED ORDER — GADOBUTROL 1 MMOL/ML IV SOLN
7.5000 mL | Freq: Once | INTRAVENOUS | Status: AC | PRN
  Administered 2022-04-20: 7.5 mL via INTRAVENOUS

## 2022-04-24 NOTE — Assessment & Plan Note (Signed)
She has recently noticed recently. We called and scheduled for appointment with the ophthalmologist. MRI ordered.

## 2022-04-24 NOTE — Assessment & Plan Note (Signed)
Advised patient to increase the dose of fluoxetine to 40 mg daily. Referral sent to psych.

## 2022-04-24 NOTE — Assessment & Plan Note (Signed)
She has a history of migraine the past. Started her on Imitrex. MRI ordered.

## 2022-04-25 ENCOUNTER — Ambulatory Visit (INDEPENDENT_AMBULATORY_CARE_PROVIDER_SITE_OTHER): Payer: Medicare Other | Admitting: Nurse Practitioner

## 2022-04-25 ENCOUNTER — Telehealth: Payer: Self-pay | Admitting: Nurse Practitioner

## 2022-04-25 ENCOUNTER — Encounter: Payer: Self-pay | Admitting: Nurse Practitioner

## 2022-04-25 VITALS — BP 112/78 | HR 74 | Temp 97.6°F | Ht 64.0 in | Wt 184.4 lb

## 2022-04-25 DIAGNOSIS — Z13820 Encounter for screening for osteoporosis: Secondary | ICD-10-CM | POA: Diagnosis not present

## 2022-04-25 DIAGNOSIS — G4489 Other headache syndrome: Secondary | ICD-10-CM

## 2022-04-25 DIAGNOSIS — R9389 Abnormal findings on diagnostic imaging of other specified body structures: Secondary | ICD-10-CM

## 2022-04-25 NOTE — Patient Instructions (Signed)
Referral sent to neurology. Advise patient to check BP at home and make a log and hold BP medication if BP below 100/60.

## 2022-04-25 NOTE — Telephone Encounter (Signed)
Pts daughter is calling in stating that the next available [facility] 06/28/2022 and would like to see if a different order can be placed to [facility] or [facility] that may have a sooner appointment.  Pts daughter would like to have a call back and she will also call those offices can check to see if they have something before the 06/28/2022 that she has already scheduled.

## 2022-04-25 NOTE — Progress Notes (Unsigned)
Established Patient Office Visit  Subjective:  Patient ID: Melissa English, female    DOB: 1950/10/25  Age: 72 y.o. MRN: [id number]  CC: No chief complaint on file.   HPI  Melissa English presents for review of the MRI result.  She still complaint of headache off and on.  She also complain on numbness on the face with headache on the right side of the face. Has used Imitrex 6 times  since last visit. She has dizziness and low blood pressure for 3-4 days. Last week but is doing better now.    HPI   Past Medical History:  Diagnosis Date   Anxiety    Arthritis 2018   hands, back...getting injections for back   CAD (coronary artery disease)    a. 08/2016 MV: EF 45%, no ischemia (EF 55-60% by echo); b. 05/2021 Cor CTA: LM nl, LAD >70p (FFRct 0.85), LCX 60-69p (FFRct 0.96), RCA non-dom, nl (FFRct 0.88). Cor Ca2+ = 613 (92nd %'ile).   GERD (gastroesophageal reflux disease)    Hiatal hernia    a. 05/2021 Cor CTA: Hiatal hernia incidentally noted.   History of echocardiogram    a. 09/2016 Echo: EF 55-60%, no rmwa; b. 05/2021 Echo: EF 55-60%, no rwma, GrI DD, nl RV fxn, mild MR.   HLD (hyperlipidemia)    HTN (hypertension)    Hypothyroidism    Sleep apnea    uses cpap    Past Surgical History:  Procedure Laterality Date   ABDOMINAL HYSTERECTOMY     BACK SURGERY  11/05/2017   CARPAL TUNNEL RELEASE Right 2008   Dr. Kennith Center   CARPAL TUNNEL RELEASE Left 11/01/2016   Procedure: CARPAL TUNNEL RELEASE;  Surgeon: Deeann Saint, MD;  Location: [facility] ORS;  Service: Orthopedics;  Laterality: Left;   CHOLECYSTECTOMY     COLONOSCOPY WITH PROPOFOL N/A 10/03/2019   Procedure: COLONOSCOPY WITH PROPOFOL;  Surgeon: Pasty Spillers, MD;  Location: [facility] ENDOSCOPY;  Service: Endoscopy;  Laterality: N/A;   COLONOSCOPY WITH PROPOFOL N/A 02/21/2022   Procedure: COLONOSCOPY WITH PROPOFOL;  Surgeon: Wyline Mood, MD;  Location: [facility] ENDOSCOPY;  Service: Gastroenterology;  Laterality: N/A;   LUMBAR DISC SURGERY   1998   L4 - L5.  no metal in back   TOTAL VAGINAL HYSTERECTOMY  1998    Family History  Problem Relation Age of Onset   Breast cancer Neg Hx     Social History   Socioeconomic History   Marital status: Married    Spouse name: Not on file   Number of children: 3   Years of education: Not on file   Highest education level: Not on file  Occupational History   Occupation: housewife  Tobacco Use   Smoking status: Never   Smokeless tobacco: Never  Vaping Use   Vaping Use: Never used  Substance and Sexual Activity   Alcohol use: Yes    Comment: occasional wine or beer   Drug use: No   Sexual activity: Not on file  Other Topics Concern   Not on file  Social History Narrative   Not on file   Social Determinants of Health   Financial Resource Strain: Low Risk  (12/12/2021)   Overall Financial Resource Strain (CARDIA)    Difficulty of Paying Living Expenses: Not hard at all  Food Insecurity: No Food Insecurity (12/12/2021)   Hunger Vital Sign    Worried About Running Out of Food in the Last Year: Never true    Ran Out of Food  in the Last Year: Never true  Transportation Needs: No Transportation Needs (12/12/2021)   PRAPARE - Administrator, Civil Service (Medical): No    Lack of Transportation (Non-Medical): No  Physical Activity: Sufficiently Active (12/12/2021)   Exercise Vital Sign    Days of Exercise per Week: 2 days    Minutes of Exercise per Session: 90 min  Stress: No Stress Concern Present (12/12/2021)   [employer] of Occupational Health - Occupational Stress Questionnaire    Feeling of Stress : Not at all  Social Connections: Moderately Integrated (12/12/2021)   Social Connection and Isolation Panel [NHANES]    Frequency of Communication with Friends and Family: More than three times a week    Frequency of Social Gatherings with Friends and Family: Once a week    Attends Religious Services: More than 4 times per year    Active Member of [employer] or  Organizations: No    Attends Banker Meetings: Never    Marital Status: Married  Catering manager Violence: Not At Risk (12/12/2021)   Humiliation, Afraid, Rape, and Kick questionnaire    Fear of Current or Ex-Partner: No    Emotionally Abused: No    Physically Abused: No    Sexually Abused: No     Outpatient Medications Prior to Visit  Medication Sig Dispense Refill   albuterol (PROVENTIL) (2.5 MG/3ML) 0.083% nebulizer solution Take 3 mLs (2.5 mg total) by nebulization every 6 (six) hours as needed for wheezing or shortness of breath. 150 mL 1   ALPRAZolam (XANAX) 0.5 MG tablet TAKE 1 TABLET BY MOUTH TWICE  DAILY AS NEEDED FOR ANXIETY 60 tablet 0   Ascorbic Acid (VITAMIN C PO) Take 1 tablet by mouth daily.     aspirin 81 MG tablet Take 81 mg by mouth daily.     atorvastatin (LIPITOR) 40 MG tablet TAKE 1 TABLET BY MOUTH DAILY 90 tablet 3   Cholecalciferol (VITAMIN D-3 PO) Take 1 capsule by mouth daily.     ezetimibe (ZETIA) 10 MG tablet Take 1 tablet (10 mg total) by mouth daily. 90 tablet 0   FLUoxetine (PROZAC) 40 MG capsule Take 1 capsule (40 mg total) by mouth daily. 90 capsule 3   isosorbide mononitrate (IMDUR) 30 MG 24 hr tablet Take 0.5 tablets (15 mg total) by mouth daily. 45 tablet 3   levothyroxine (SYNTHROID) 75 MCG tablet Take 1 tablet (75 mcg total) by mouth daily. 90 tablet 3   lisinopril (ZESTRIL) 10 MG tablet Take 1 tablet (10 mg total) by mouth daily. 90 tablet 3   metFORMIN (GLUCOPHAGE) 500 MG tablet Take 500 mg by mouth daily.     nitroGLYCERIN (NITROSTAT) 0.4 MG SL tablet Place 1 tablet (0.4 mg total) under the tongue every 5 (five) minutes as needed for chest pain. 25 tablet 3   omeprazole (PRILOSEC) 20 MG capsule TAKE 1 CAPSULE BY MOUTH  DAILY 90 capsule 3   phentermine (ADIPEX-P) 37.5 MG tablet Take 37.5 mg by mouth daily before breakfast.     phentermine 15 MG capsule Take 37.5 mg by mouth daily.     SUMAtriptan (IMITREX) 25 MG tablet Take 1 tablet  (25 mg total) by mouth every 8 (eight) hours as needed for migraine. 10 tablet 0   No facility-administered medications prior to visit.    Allergies  Allergen Reactions   Codeine Nausea And Vomiting, Other (See Comments) and Nausea Only    Severe n & v   Sulfa Antibiotics  Nausea And Vomiting    Occurred a long time ago so patient is unsure of reaction   Other Other (See Comments)   Sulfasalazine    Prednisone Other (See Comments)    Could not sleep and felt nervous.    ROS Review of Systems    Objective:    Physical Exam  There were no vitals taken for this visit. Wt Readings from Last 3 Encounters:  04/13/22 182 lb 3.2 oz (82.6 kg)  03/24/22 184 lb 9.6 oz (83.7 kg)  03/08/22 187 lb (84.8 kg)     Health Maintenance  Topic Date Due   DTaP/Tdap/Td (1 - Tdap) Never done   DEXA SCAN  Never done   COVID-19 Vaccine (3 - Moderna risk series) 04/29/2022 (Originally 04/30/2019)   INFLUENZA VACCINE  08/10/2022   Medicare Annual Wellness (AWV)  12/13/2022   MAMMOGRAM  03/08/2023   COLONOSCOPY (Pts 45-55yrs Insurance coverage will need to be confirmed)  02/22/2024   Pneumonia Vaccine 62+ Years old  Completed   Hepatitis C Screening  Completed   Zoster Vaccines- Shingrix  Completed   HPV VACCINES  Aged Out    There are no preventive care reminders to display for this patient.  Lab Results  Component Value Date   TSH 6.36 (H) 03/24/2022   Lab Results  Component Value Date   WBC 7.2 03/08/2022   HGB 11.2 (L) 03/08/2022   HCT 35.2 (L) 03/08/2022   MCV 93.4 03/08/2022   PLT 195 03/08/2022   Lab Results  Component Value Date   NA 138 09/05/2021   K 4.3 09/05/2021   CO2 27 09/05/2021   GLUCOSE 111 (H) 09/05/2021   BUN 20 09/05/2021   CREATININE 0.85 09/05/2021   BILITOT 1.5 (H) 09/05/2021   ALKPHOS 110 09/05/2021   AST 21 09/05/2021   ALT 22 09/05/2021   PROT 7.3 09/05/2021   ALBUMIN 4.2 09/05/2021   CALCIUM 9.3 09/05/2021   ANIONGAP 7 09/05/2021   EGFR 85  11/03/2020   Lab Results  Component Value Date   CHOL 121 09/05/2021   Lab Results  Component Value Date   HDL 52 09/05/2021   Lab Results  Component Value Date   LDLCALC 57 09/05/2021   Lab Results  Component Value Date   TRIG 62 09/05/2021   Lab Results  Component Value Date   CHOLHDL 2.3 09/05/2021   No results found for: "HGBA1C"    Assessment & Plan:  There are no diagnoses linked to this encounter.  Follow-up: No follow-ups on file.   Kara Dies, NP

## 2022-04-26 ENCOUNTER — Encounter: Payer: Self-pay | Admitting: Nurse Practitioner

## 2022-04-26 ENCOUNTER — Telehealth: Payer: Self-pay

## 2022-04-26 DIAGNOSIS — R9389 Abnormal findings on diagnostic imaging of other specified body structures: Secondary | ICD-10-CM | POA: Insufficient documentation

## 2022-04-26 NOTE — Telephone Encounter (Signed)
I have printed the order for you to sign for Dexa scan please see rest of note ask you if you had contacted Dr. Sherryll Burger

## 2022-04-26 NOTE — Assessment & Plan Note (Signed)
Headaches off and on with numbness of face on the right side. Will refer for neurology consult.

## 2022-04-26 NOTE — Assessment & Plan Note (Signed)
The MRIs result shows no acute infarct.   Chronic small vessel ischemia disease. 2 cm right parietal skull lesion indeterminate. Would refer to the neurologist for further evaluation

## 2022-04-26 NOTE — Telephone Encounter (Signed)
Hii Margaret, The daughter was calling [facility] to schedule Dexa scan. I think Jenate by mistake forwarded the message to you instead of me.  Thank you for looking into it.

## 2022-04-26 NOTE — Telephone Encounter (Signed)
Patient's daughter, Marney Doctor, called to state patient was referred to [facility] for a dexa scan but it will be 06/28/2022 before she can have her scan.  Morrie Sheldon states she called [facility] and they can get patient in for a dexa scan this week.  Ashely states she would like for Kara Dies, NP, to go ahead and send a referral for patient to [facility].  Morrie Sheldon states their fax number is [phone].  Morrie Sheldon states Kara Dies, NP, was going to reach out to Dr. Sherryll Burger to review MRI result for his opinion prior to her follow-up appointment with patient.  Ashely states she would like to know if Kara Dies, NP has had a chance to speak with Dr. Sherryll Burger.

## 2022-04-27 NOTE — Telephone Encounter (Signed)
Got Charanpreet Kaur's signature and faxed to [facility]

## 2022-04-27 NOTE — Telephone Encounter (Signed)
F/u   Daughter wanted to know if NP spoke with Dr. Sherryll Burger concerning her mother  MRI scan.   Asking for a call back .

## 2022-04-27 NOTE — Telephone Encounter (Signed)
Noted  

## 2022-04-27 NOTE — Telephone Encounter (Signed)
I did indeed but they were able to get it figured out.

## 2022-04-28 NOTE — Telephone Encounter (Signed)
Pt daughter called back regarding the fax to [facility]. Call was transferred to [facility] RN.

## 2022-04-28 NOTE — Telephone Encounter (Signed)
Per Olegario Messier, pt's daughter called [facility] & confirmed that fax was received.

## 2022-05-02 DIAGNOSIS — M8588 Other specified disorders of bone density and structure, other site: Secondary | ICD-10-CM | POA: Diagnosis not present

## 2022-05-02 DIAGNOSIS — M85852 Other specified disorders of bone density and structure, left thigh: Secondary | ICD-10-CM | POA: Diagnosis not present

## 2022-05-02 DIAGNOSIS — R519 Headache, unspecified: Secondary | ICD-10-CM | POA: Diagnosis not present

## 2022-05-02 DIAGNOSIS — R200 Anesthesia of skin: Secondary | ICD-10-CM | POA: Diagnosis not present

## 2022-05-02 DIAGNOSIS — R9389 Abnormal findings on diagnostic imaging of other specified body structures: Secondary | ICD-10-CM | POA: Diagnosis not present

## 2022-05-07 DIAGNOSIS — R0781 Pleurodynia: Secondary | ICD-10-CM | POA: Diagnosis not present

## 2022-05-08 ENCOUNTER — Other Ambulatory Visit: Payer: Self-pay | Admitting: Nurse Practitioner

## 2022-05-08 DIAGNOSIS — F419 Anxiety disorder, unspecified: Secondary | ICD-10-CM

## 2022-05-09 ENCOUNTER — Other Ambulatory Visit: Payer: Self-pay

## 2022-05-09 DIAGNOSIS — F419 Anxiety disorder, unspecified: Secondary | ICD-10-CM

## 2022-05-09 NOTE — Telephone Encounter (Signed)
Prescription Request  05/09/2022  LOV: Visit date not found  What is the name of the medication or equipment?  ALPRAZolam (XANAX) 0.5 MG tablet    Have you contacted your pharmacy to request a refill? Yes   Which pharmacy would you like this sent to?   [facility] [place], [place] - [address] [facility] [address] [place] [address] [place] [address] Phone: [phone] Fax: [phone]   Patient notified that their request is being sent to the clinical staff for review and that they should receive a response within 2 business days.   Please advise at [facility] [phone]  Patient states she still has about 20 pills left.  Patient states her pharmacy is still waiting to hear back from [place].  Patient states this is a 90-day supply.  *Patient also states she would like to know if we have the results of her bone density scan.

## 2022-05-10 ENCOUNTER — Telehealth: Payer: Self-pay | Admitting: Nurse Practitioner

## 2022-05-10 MED ORDER — ALPRAZOLAM 0.5 MG PO TABS: 0.5000 mg | ORAL_TABLET | Freq: Two times a day (BID) | ORAL | 0 refills | Status: DC | PRN

## 2022-05-10 NOTE — Telephone Encounter (Signed)
I looked into chart- Pt had MRI on 4/23 with [facility].  I notified pt to contact them for results.

## 2022-05-10 NOTE — Telephone Encounter (Signed)
Spoke with Patient daughter was able to find Dexa scan in [facility] care everywhere patient daughter is on [place]. Spoke with Dr. Birdie Sons concerning plan to have patient come in and discuss dexa with PCP since patient has had wto possible rib fractures results to X-ray in Care everywhere.. Patient daughter staed she would have her mom call and schedule appt with PCP to discuss.

## 2022-05-10 NOTE — Telephone Encounter (Unsigned)
LOV: 04/13/22  NOV: July 2024  PLEASE NOTE THAT PT IS ASKING FOR A 90 DAY SUPPLY  Patient is scheduled for Dexa scan in June, but asking for results. I will send her a mychart regarding this

## 2022-05-10 NOTE — Telephone Encounter (Signed)
Patient called and would like her bone density results.

## 2022-05-10 NOTE — Telephone Encounter (Signed)
Medication refilled

## 2022-05-11 ENCOUNTER — Ambulatory Visit: Payer: Medicare Other | Admitting: Nurse Practitioner

## 2022-05-12 ENCOUNTER — Other Ambulatory Visit: Payer: Medicare Other

## 2022-05-15 ENCOUNTER — Other Ambulatory Visit: Payer: Self-pay | Admitting: Nurse Practitioner

## 2022-05-16 ENCOUNTER — Other Ambulatory Visit: Payer: Medicare Other

## 2022-05-16 ENCOUNTER — Encounter: Payer: Self-pay | Admitting: Nurse Practitioner

## 2022-05-16 ENCOUNTER — Ambulatory Visit (INDEPENDENT_AMBULATORY_CARE_PROVIDER_SITE_OTHER): Payer: Medicare Other | Admitting: Nurse Practitioner

## 2022-05-16 VITALS — BP 118/78 | HR 67 | Temp 97.8°F | Ht 66.0 in | Wt 186.0 lb

## 2022-05-16 DIAGNOSIS — M8580 Other specified disorders of bone density and structure, unspecified site: Secondary | ICD-10-CM

## 2022-05-16 NOTE — Progress Notes (Signed)
Established Patient Office Visit  Subjective:  Patient ID: Melissa English, female    DOB: 05-14-1950  Age: 72 y.o. MRN: [id number]  CC:  Chief Complaint  Patient presents with   Medical Management of Chronic Issues    Discuss bone density    HPI  Melissa English presents for follow up on Bone density she had [facility]. The Bone density results shows normal bone mineral density at lumbar spine (T-score : -0.5) but low bone mass on left hip (T-score=-1.8).   HPI   Past Medical History:  Diagnosis Date   Anxiety    Arthritis 2018   hands, back...getting injections for back   CAD (coronary artery disease)    a. 08/2016 MV: EF 45%, no ischemia (EF 55-60% by echo); b. 05/2021 Cor CTA: LM nl, LAD >70p (FFRct 0.85), LCX 60-69p (FFRct 0.96), RCA non-dom, nl (FFRct 0.88). Cor Ca2+ = 613 (92nd %'ile).   GERD (gastroesophageal reflux disease)    Hiatal hernia    a. 05/2021 Cor CTA: Hiatal hernia incidentally noted.   History of echocardiogram    a. 09/2016 Echo: EF 55-60%, no rmwa; b. 05/2021 Echo: EF 55-60%, no rwma, GrI DD, nl RV fxn, mild MR.   HLD (hyperlipidemia)    HTN (hypertension)    Hypothyroidism    Sleep apnea    uses cpap    Past Surgical History:  Procedure Laterality Date   ABDOMINAL HYSTERECTOMY     BACK SURGERY  11/05/2017   CARPAL TUNNEL RELEASE Right 2008   Dr. Kennith Center   CARPAL TUNNEL RELEASE Left 11/01/2016   Procedure: CARPAL TUNNEL RELEASE;  Surgeon: Deeann Saint, MD;  Location: [facility] ORS;  Service: Orthopedics;  Laterality: Left;   CHOLECYSTECTOMY     COLONOSCOPY WITH PROPOFOL N/A 10/03/2019   Procedure: COLONOSCOPY WITH PROPOFOL;  Surgeon: Pasty Spillers, MD;  Location: [facility] ENDOSCOPY;  Service: Endoscopy;  Laterality: N/A;   COLONOSCOPY WITH PROPOFOL N/A 02/21/2022   Procedure: COLONOSCOPY WITH PROPOFOL;  Surgeon: Wyline Mood, MD;  Location: [facility] ENDOSCOPY;  Service: Gastroenterology;  Laterality: N/A;   LUMBAR DISC SURGERY  1998   L4 - L5.  no metal in back    TOTAL VAGINAL HYSTERECTOMY  1998    Family History  Problem Relation Age of Onset   Breast cancer Neg Hx     Social History   Socioeconomic History   Marital status: Married    Spouse name: Not on file   Number of children: 3   Years of education: Not on file   Highest education level: Not on file  Occupational History   Occupation: housewife  Tobacco Use   Smoking status: Never   Smokeless tobacco: Never  Vaping Use   Vaping Use: Never used  Substance and Sexual Activity   Alcohol use: Yes    Comment: occasional wine or beer   Drug use: No   Sexual activity: Not on file  Other Topics Concern   Not on file  Social History Narrative   Not on file   Social Determinants of Health   Financial Resource Strain: Low Risk  (12/12/2021)   Overall Financial Resource Strain (CARDIA)    Difficulty of Paying Living Expenses: Not hard at all  Food Insecurity: No Food Insecurity (12/12/2021)   Hunger Vital Sign    Worried About Running Out of Food in the Last Year: Never true    Ran Out of Food in the Last Year: Never true  Transportation Needs: No Transportation Needs (  12/12/2021)   PRAPARE - Administrator, Civil Service (Medical): No    Lack of Transportation (Non-Medical): No  Physical Activity: Sufficiently Active (12/12/2021)   Exercise Vital Sign    Days of Exercise per Week: 2 days    Minutes of Exercise per Session: 90 min  Stress: No Stress Concern Present (12/12/2021)   [employer] of Occupational Health - Occupational Stress Questionnaire    Feeling of Stress : Not at all  Social Connections: Moderately Integrated (12/12/2021)   Social Connection and Isolation Panel [NHANES]    Frequency of Communication with Friends and Family: More than three times a week    Frequency of Social Gatherings with Friends and Family: Once a week    Attends Religious Services: More than 4 times per year    Active Member of [employer] or Organizations: No    Attends Tax inspector Meetings: Never    Marital Status: Married  Catering manager Violence: Not At Risk (12/12/2021)   Humiliation, Afraid, Rape, and Kick questionnaire    Fear of Current or Ex-Partner: No    Emotionally Abused: No    Physically Abused: No    Sexually Abused: No     Outpatient Medications Prior to Visit  Medication Sig Dispense Refill   albuterol (PROVENTIL) (2.5 MG/3ML) 0.083% nebulizer solution Take 3 mLs (2.5 mg total) by nebulization every 6 (six) hours as needed for wheezing or shortness of breath. 150 mL 1   Ascorbic Acid (VITAMIN C PO) Take 1 tablet by mouth daily.     aspirin 81 MG tablet Take 81 mg by mouth daily.     atorvastatin (LIPITOR) 40 MG tablet TAKE 1 TABLET BY MOUTH DAILY 90 tablet 3   Cholecalciferol (VITAMIN D-3 PO) Take 1 capsule by mouth daily.     ezetimibe (ZETIA) 10 MG tablet TAKE 1 TABLET BY MOUTH DAILY 90 tablet 2   FLUoxetine (PROZAC) 40 MG capsule Take 1 capsule (40 mg total) by mouth daily. 90 capsule 3   isosorbide mononitrate (IMDUR) 30 MG 24 hr tablet Take 0.5 tablets (15 mg total) by mouth daily. 45 tablet 3   levothyroxine (SYNTHROID) 75 MCG tablet Take 1 tablet (75 mcg total) by mouth daily. 90 tablet 3   lisinopril (ZESTRIL) 10 MG tablet Take 1 tablet (10 mg total) by mouth daily. 90 tablet 3   metFORMIN (GLUCOPHAGE) 500 MG tablet Take 500 mg by mouth daily.     nitroGLYCERIN (NITROSTAT) 0.4 MG SL tablet Place 1 tablet (0.4 mg total) under the tongue every 5 (five) minutes as needed for chest pain. 25 tablet 3   omeprazole (PRILOSEC) 20 MG capsule TAKE 1 CAPSULE BY MOUTH  DAILY 90 capsule 3   phentermine (ADIPEX-P) 37.5 MG tablet Take 37.5 mg by mouth daily before breakfast.     phentermine 15 MG capsule Take 37.5 mg by mouth daily.     SUMAtriptan (IMITREX) 25 MG tablet Take 1 tablet (25 mg total) by mouth every 8 (eight) hours as needed for migraine. 10 tablet 0   ALPRAZolam (XANAX) 0.5 MG tablet Take 1 tablet (0.5 mg total) by mouth 2  (two) times daily as needed. for anxiety 60 tablet 0   No facility-administered medications prior to visit.    Allergies  Allergen Reactions   Codeine Nausea And Vomiting, Other (See Comments) and Nausea Only    Severe n & v   Sulfa Antibiotics Nausea And Vomiting    Occurred a long time ago  so patient is unsure of reaction   Other Other (See Comments)   Sulfasalazine    Prednisone Other (See Comments)    Could not sleep and felt nervous.    ROS Review of Systems  All other systems reviewed and are negative.     Objective:    Physical Exam Constitutional:      Appearance: Normal appearance.  Cardiovascular:     Pulses: Normal pulses.     Heart sounds: Normal heart sounds. No murmur heard.    No friction rub.  Pulmonary:     Effort: Pulmonary effort is normal. No respiratory distress.     Breath sounds: No rhonchi.  Neurological:     General: No focal deficit present.     Mental Status: She is alert and oriented to person, place, and time. Mental status is at baseline.  Psychiatric:        Mood and Affect: Mood normal.        Behavior: Behavior normal.        Thought Content: Thought content normal.        Judgment: Judgment normal.     BP 118/78   Pulse 67   Temp 97.8 F (36.6 C) (Oral)   Ht 5\' 6"  (1.676 m)   Wt 186 lb (84.4 kg)   SpO2 98%   BMI 30.02 kg/m  Wt Readings from Last 3 Encounters:  05/16/22 186 lb (84.4 kg)  04/25/22 184 lb 6.4 oz (83.6 kg)  04/13/22 182 lb 3.2 oz (82.6 kg)     Health Maintenance  Topic Date Due   DTaP/Tdap/Td (1 - Tdap) Never done   DEXA SCAN  Never done   COVID-19 Vaccine (3 - Moderna risk series) 04/30/2019   INFLUENZA VACCINE  08/10/2022   Medicare Annual Wellness (AWV)  12/13/2022   MAMMOGRAM  03/08/2023   Colonoscopy  02/22/2024   Pneumonia Vaccine 41+ Years old  Completed   Hepatitis C Screening  Completed   Zoster Vaccines- Shingrix  Completed   HPV VACCINES  Aged Out    There are no preventive care  reminders to display for this patient.  Lab Results  Component Value Date   TSH 1.250 05/31/2022   Lab Results  Component Value Date   WBC 7.2 03/08/2022   HGB 11.2 (L) 03/08/2022   HCT 35.2 (L) 03/08/2022   MCV 93.4 03/08/2022   PLT 195 03/08/2022   Lab Results  Component Value Date   NA 138 09/05/2021   K 4.3 09/05/2021   CO2 27 09/05/2021   GLUCOSE 111 (H) 09/05/2021   BUN 20 09/05/2021   CREATININE 0.85 09/05/2021   BILITOT 1.5 (H) 09/05/2021   ALKPHOS 110 09/05/2021   AST 21 09/05/2021   ALT 22 09/05/2021   PROT 6.5 05/31/2022   ALBUMIN 4.2 09/05/2021   CALCIUM 9.3 09/05/2021   ANIONGAP 7 09/05/2021   EGFR 85 11/03/2020   Lab Results  Component Value Date   CHOL 121 09/05/2021   Lab Results  Component Value Date   HDL 52 09/05/2021   Lab Results  Component Value Date   LDLCALC 57 09/05/2021   Lab Results  Component Value Date   TRIG 62 09/05/2021   Lab Results  Component Value Date   CHOLHDL 2.3 09/05/2021   No results found for: "HGBA1C"    Assessment & Plan:  Osteopenia, unspecified location Assessment & Plan: T-score lumbar spine -0.5, left hip T-score -1.5 and total hip T-score -1.8. Advised patient to do  weightbearing exercises. Encouraged on using calcium and vitamin D  supplements. Will check vitamin D levels if on lower side will send vitamin D Rx.  Orders: -     VITAMIN D 25 Hydroxy (Vit-D Deficiency, Fractures)    Follow-up: Return in about 6 months (around 11/16/2022).   Kara Dies, NP

## 2022-05-16 NOTE — Patient Instructions (Signed)
Lab work for Melissa English and vitamin D The referral was placed by neurolgist at [facility] clinic, please call and schedule the appointment [facility] [phone]

## 2022-05-17 LAB — VITAMIN D 25 HYDROXY (VIT D DEFICIENCY, FRACTURES): VITD: 38.62 ng/mL (ref 30.00–100.00)

## 2022-05-23 ENCOUNTER — Telehealth: Payer: Self-pay | Admitting: Nurse Practitioner

## 2022-05-23 DIAGNOSIS — R9389 Abnormal findings on diagnostic imaging of other specified body structures: Secondary | ICD-10-CM

## 2022-05-23 NOTE — Telephone Encounter (Signed)
Patient's daughter, Marney Doctor, is calling to follow-up on her message from this morning.  Morrie Sheldon states she would like to speak with someone today.

## 2022-05-23 NOTE — Telephone Encounter (Signed)
Pt daughter Morrie Sheldon called in staying that NP Evelene Croon did a referral for Neurology for pt, however, from Neuro they referred pt hematology, and she called because they need lab orders in, and only Evelene Croon can put those orders in. Any questions, her direct number at the moment is [phone].

## 2022-05-24 ENCOUNTER — Other Ambulatory Visit: Payer: Self-pay | Admitting: Nurse Practitioner

## 2022-05-24 NOTE — Telephone Encounter (Signed)
The labs are ordered  

## 2022-05-24 NOTE — Addendum Note (Signed)
Addended by: Warden Fillers on: 05/24/2022 02:18 PM   Modules accepted: Orders

## 2022-05-24 NOTE — Telephone Encounter (Addendum)
See message below-I have pended the labs (I didn't know what diagnosis to use-they all go to [facility], so pt can some here or go to [facility]. If she decides to go to [facility], please let us know so we can change orders to be seen by their drawing stations.

## 2022-05-24 NOTE — Telephone Encounter (Signed)
LMTCB

## 2022-05-24 NOTE — Telephone Encounter (Signed)
Marney Doctor, daughter of pt returned my call.  She reported that pt was seen by Elvin So, PA at [facility] Neuro office and referred pt to Hematology. Hematology office needs some preliminary labs to decide if pt needs Hematology or Oncology. These labs cannot be done at the Neurology office and they have requested that we order and do them.  Melissa English reported that the following labs are what she has been told what is needed: Serum Protein Electrophoresis Immunofixation Light Chains  Please order labs and forward back to pool so that we can scheduled the lab appointment.

## 2022-05-24 NOTE — Addendum Note (Signed)
Addended by: Kara Dies on: 05/24/2022 08:27 PM   Modules accepted: Orders

## 2022-05-24 NOTE — Telephone Encounter (Signed)
Patient's daughter call office back.

## 2022-05-26 ENCOUNTER — Other Ambulatory Visit: Payer: Self-pay | Admitting: *Deleted

## 2022-05-26 DIAGNOSIS — F419 Anxiety disorder, unspecified: Secondary | ICD-10-CM

## 2022-05-29 ENCOUNTER — Telehealth: Payer: Self-pay | Admitting: *Deleted

## 2022-05-29 MED ORDER — ALPRAZOLAM 0.5 MG PO TABS: 0.5000 mg | ORAL_TABLET | Freq: Two times a day (BID) | ORAL | 0 refills | Status: DC | PRN

## 2022-05-29 NOTE — Telephone Encounter (Addendum)
Received a fax from [facility] with the following message:  Per the FDA's recent safety communication, prescribers should limit opioid pain medications with benzodiazepines or other CNS depressants only to patients whom alternative treatment options are inadequate. The patient has been prescribed Hydrocodone/APAP 5-325mg . We will fill the prescription for Xanax as provided so as to not delay therapy.  If you wish to change therapy going forward please respond with a new eRX as clarification and indicate CLARIFICATION RESPONSE in the notes field of the Erx, along with any additional notes that will assist our pharmacists in further processing.

## 2022-05-30 ENCOUNTER — Telehealth: Payer: Self-pay

## 2022-05-30 ENCOUNTER — Encounter: Payer: Self-pay | Admitting: Nurse Practitioner

## 2022-05-30 NOTE — Telephone Encounter (Signed)
Patient's daughter, Marney Doctor, called to state that she feels like patients are falling through the cracks at this practice.  Morrie Sheldon states she feels like she is having to call and follow-up on everything.  Morrie Sheldon states she needs to know if the labs have been ordered for patient and if so, can we schedule a lab appointment.  I let Morrie Sheldon know that I do see lab orders in patient's chart, but I'm not familiar with them.  I let Morrie Sheldon know that we need to verify whether or not these labs will require fasting and asked if she would like to hold or would she rather I call her back.  Morrie Sheldon states she has another call to make, so I will call her back.  My supervisor, Georga Bora, is looking into this.  Erie Noe states the labs are non-fasting and they will be done here, in our lab.  We will call patient with the results once we have them.  I spoke with Morrie Sheldon and relayed the message.  Morrie Sheldon asked if we could please also forward the results to [facility] (they need the lab results to determine who patient needs to see at [facility])  Background: We referred patient to Dr. Cristopher Peru, [facility] Morrie Sheldon spoke with Park Meo at [facility]) - lesion found on MRI but it was something they could not treat.  They referred her to [facility], who states patient needs labs drawn, but they could not order the labs because they have not seen her as a patient yet (per scheduler at the practice).  [facility] states patient's PCP can order the labs.  Morrie Sheldon asked that we please send the results to [facility].  Morrie Sheldon states the scheduler told her that they are unable to schedule the visit without the lab results because they need to know the area in which the patient needs to be scheduled.  I also scheduled an appointment for patient to have labs drawn tomorrow.

## 2022-05-31 ENCOUNTER — Other Ambulatory Visit (INDEPENDENT_AMBULATORY_CARE_PROVIDER_SITE_OTHER): Payer: Medicare Other

## 2022-05-31 DIAGNOSIS — R7989 Other specified abnormal findings of blood chemistry: Secondary | ICD-10-CM | POA: Diagnosis not present

## 2022-05-31 DIAGNOSIS — R9389 Abnormal findings on diagnostic imaging of other specified body structures: Secondary | ICD-10-CM | POA: Diagnosis not present

## 2022-05-31 NOTE — Addendum Note (Signed)
Addended by: Warden Fillers on: 05/31/2022 02:16 PM   Modules accepted: Orders

## 2022-05-31 NOTE — Telephone Encounter (Signed)
Can you please call the patient and check when was she prescribed hydrocodone and where? I do not see hydrocodone in her medication list.

## 2022-05-31 NOTE — Telephone Encounter (Signed)
Noted  

## 2022-05-31 NOTE — Telephone Encounter (Signed)
Thank you :)

## 2022-05-31 NOTE — Telephone Encounter (Signed)
The TSH was not ordered future, therefore it was not drawn on 5/7 at lab appt. Pt discussed this with me today during lab appt & I have reordered the TSH & will add it to the test going to [facility] today. Pt aware

## 2022-06-01 LAB — PROTEIN ELECTROPHORESIS, SERUM

## 2022-06-01 LAB — TSH: TSH: 1.25 u[IU]/mL (ref 0.450–4.500)

## 2022-06-01 LAB — IMMUNOFIXATION, SERUM

## 2022-06-01 NOTE — Telephone Encounter (Signed)
Spoke to pt and she is not taking Hydrocodone. She was prescribed it when she broke her finger but is not taking it currently and does not need refill

## 2022-06-01 NOTE — Telephone Encounter (Signed)
Noted! Thank you

## 2022-06-02 ENCOUNTER — Other Ambulatory Visit: Payer: Self-pay | Admitting: Nurse Practitioner

## 2022-06-02 ENCOUNTER — Encounter: Payer: Self-pay | Admitting: Nurse Practitioner

## 2022-06-02 ENCOUNTER — Telehealth: Payer: Self-pay | Admitting: Nurse Practitioner

## 2022-06-02 DIAGNOSIS — M899 Disorder of bone, unspecified: Secondary | ICD-10-CM

## 2022-06-02 DIAGNOSIS — M8580 Other specified disorders of bone density and structure, unspecified site: Secondary | ICD-10-CM | POA: Insufficient documentation

## 2022-06-02 NOTE — Addendum Note (Signed)
Addended by: Gabriel Rainwater on: 06/02/2022 03:36 PM   Modules accepted: Orders

## 2022-06-02 NOTE — Telephone Encounter (Signed)
Please inform patient medication has been refilled and a bone scan ordered placed as recommended by Dr. Garner Nash Hematologist.

## 2022-06-02 NOTE — Telephone Encounter (Signed)
Spoke with the patient and made aware of the response.

## 2022-06-02 NOTE — Telephone Encounter (Signed)
Refill for SUMAtriptan (IMITREX) 25 MG tablet   LR- 04/13/22 ( 10 tabs/ no refills) LOV- 05/16/22 NV- 07/27/22

## 2022-06-02 NOTE — Telephone Encounter (Signed)
Prescription Request  06/02/2022  LOV: 05/16/2022  What is the name of the medication or equipment? SUMAtriptan (IMITREX) 25 MG tablet. PATIENT IS OUT OF MEDICATION AND HEADACHES ARE BACK. PHARMACY STATE IT COULD NOT BE REFILLED UNTIL 06/08/2022. SHE NEEDS ANOTHER PRESCRIPTION.    Have you contacted your pharmacy to request a refill? Yes   Which pharmacy would you like this sent to?   [id number] [place], [place] - [address] AT [facility] [address] [place] [address] Phone: [phone] Fax: [phone]        Patient notified that their request is being sent to the clinical staff for review and that they should receive a response within 2 business days.   Please advise at Mobile [phone] (mobile)

## 2022-06-02 NOTE — Telephone Encounter (Signed)
I just want to clarify what I need to advise the patient. Please advise. Thanks

## 2022-06-02 NOTE — Assessment & Plan Note (Signed)
T-score lumbar spine -0.5, left hip T-score -1.5 and total hip T-score -1.8. Advised patient to do weightbearing exercises. Encouraged on using calcium and vitamin D  supplements. Will check vitamin D levels if on lower side will send vitamin D Rx.

## 2022-06-04 IMAGING — MG MM DIGITAL SCREENING BILAT W/ TOMO AND CAD
8 series · 8 of 24 positions shown · non-contrast
Comparison: Previous exam(s).

CLINICAL DATA: Screening.

EXAM:
DIGITAL SCREENING BILATERAL MAMMOGRAM WITH TOMOSYNTHESIS AND CAD
TECHNIQUE: Bilateral screening digital craniocaudal and mediolateral oblique
mammograms were obtained. Bilateral screening digital breast
tomosynthesis was performed. The images were evaluated with
computer-aided detection.

[L MLO synth-2D]
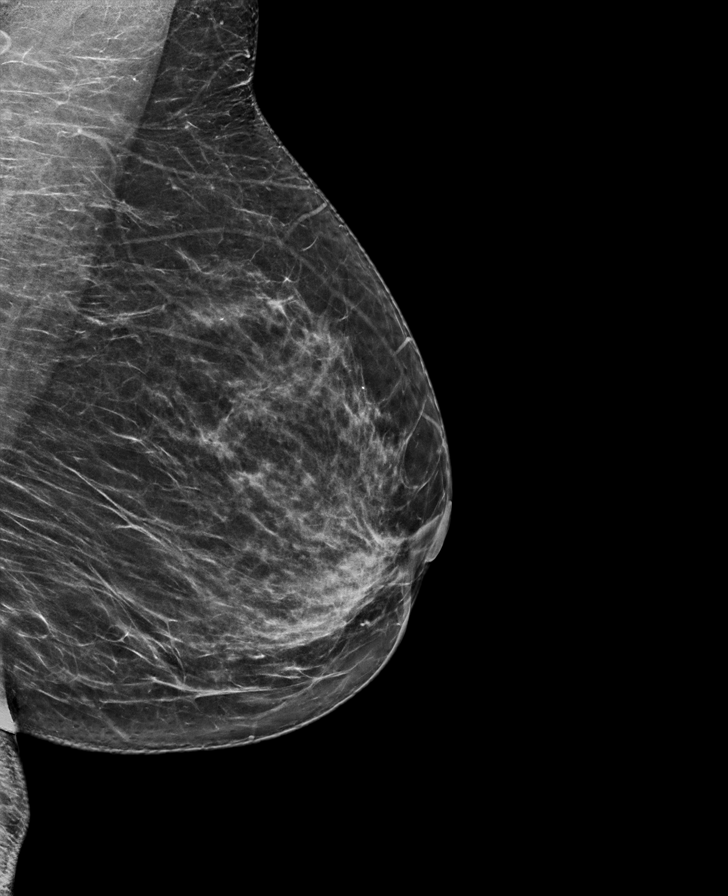

[R MLO synth-2D]
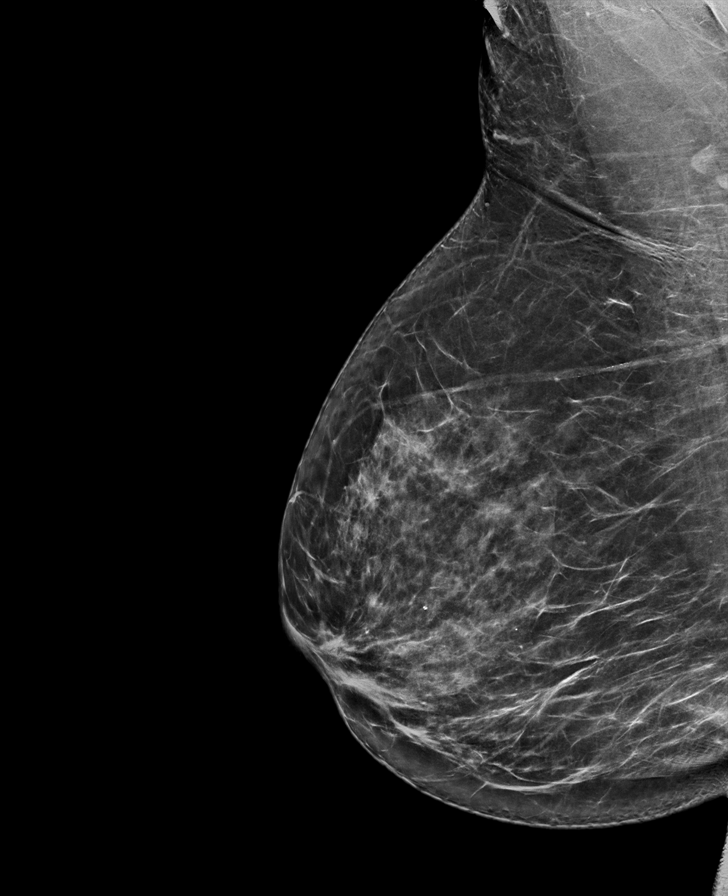

[L CC synth-2D]
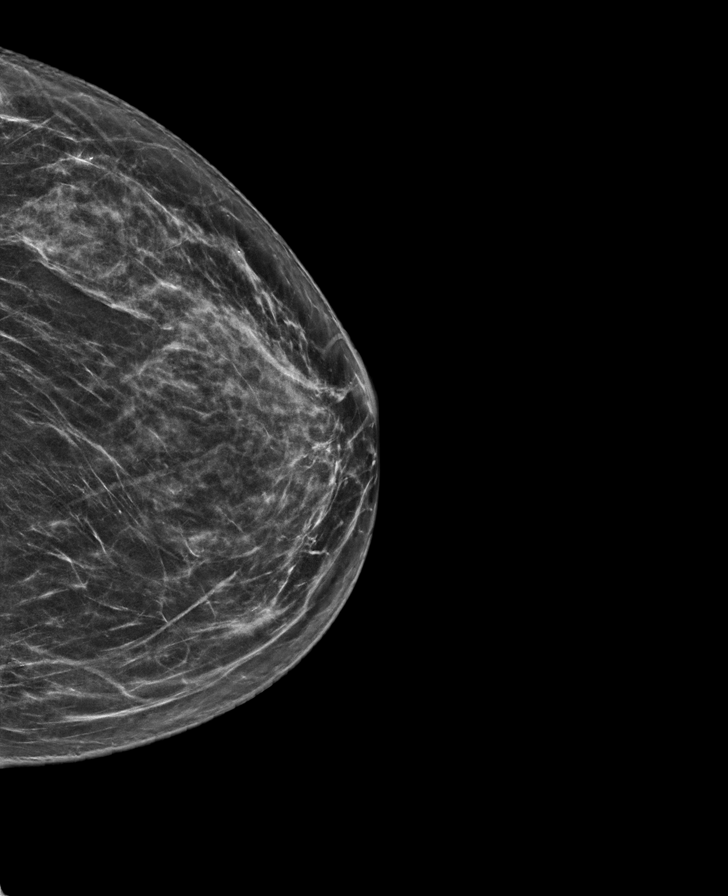

[R CC synth-2D]
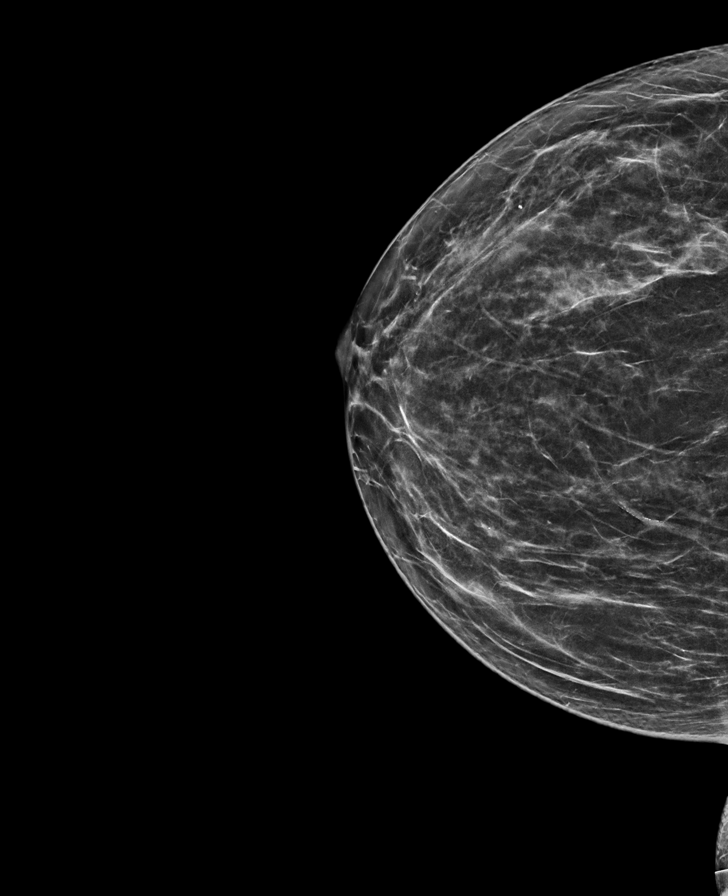

[R CC tomo · tomo slice 33/65.0]
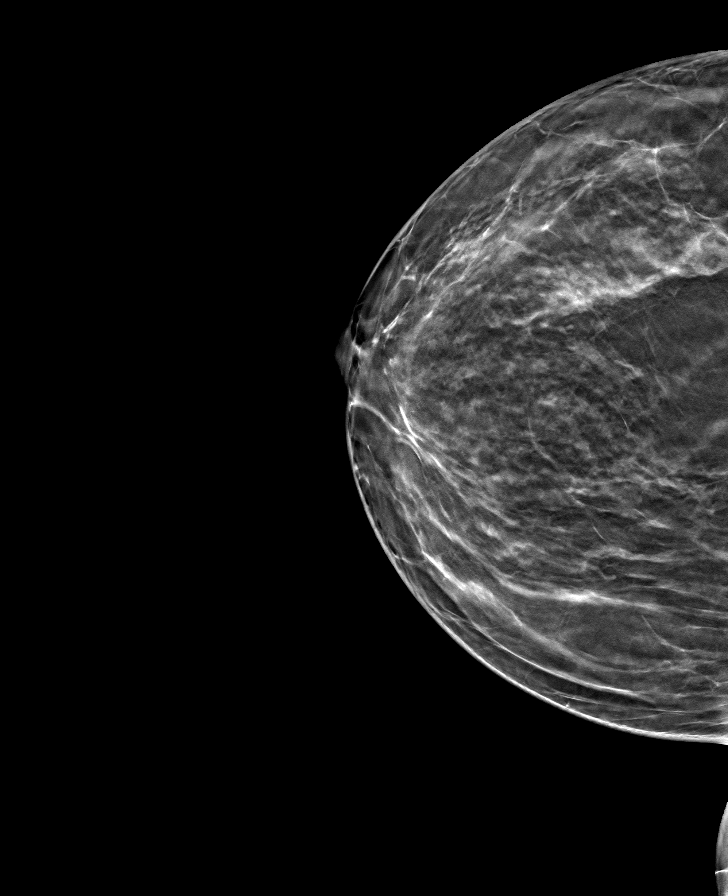

[L MLO tomo · tomo slice 37/73.0]
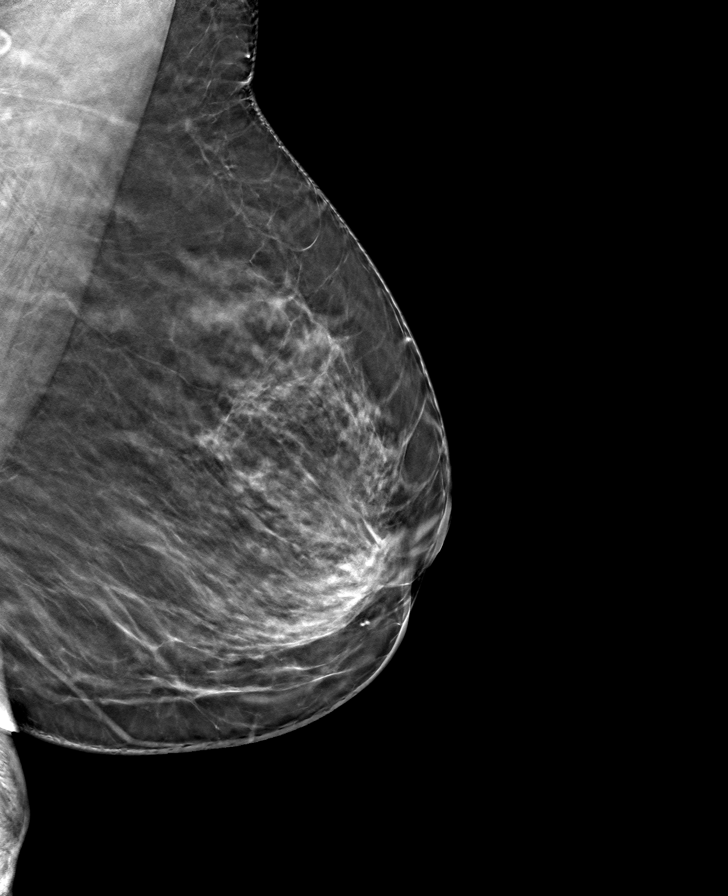

[R MLO tomo · tomo slice 39/78.0]
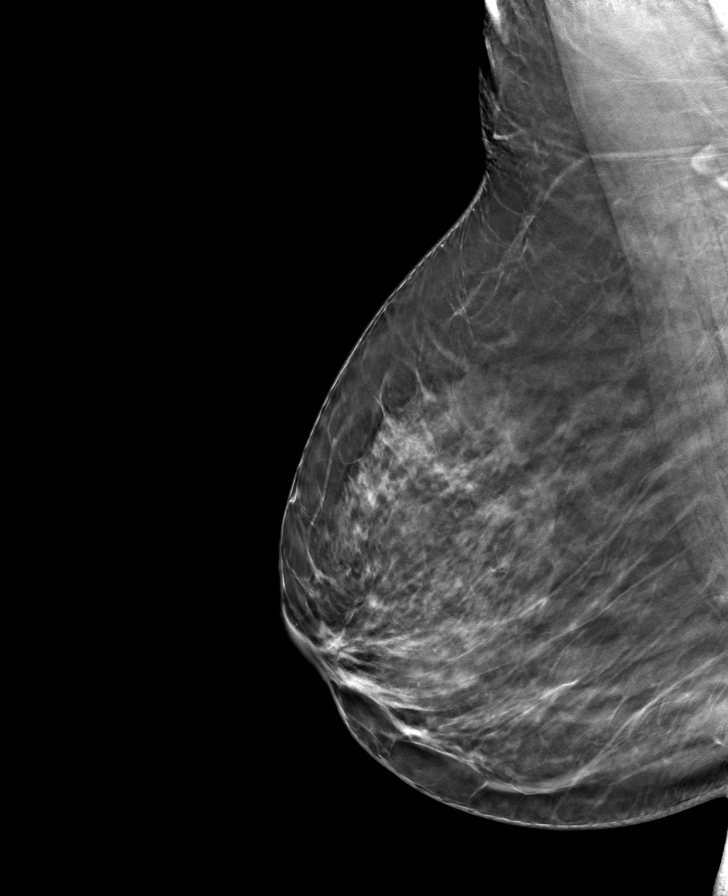

[L CC tomo · tomo slice 35/69.0]
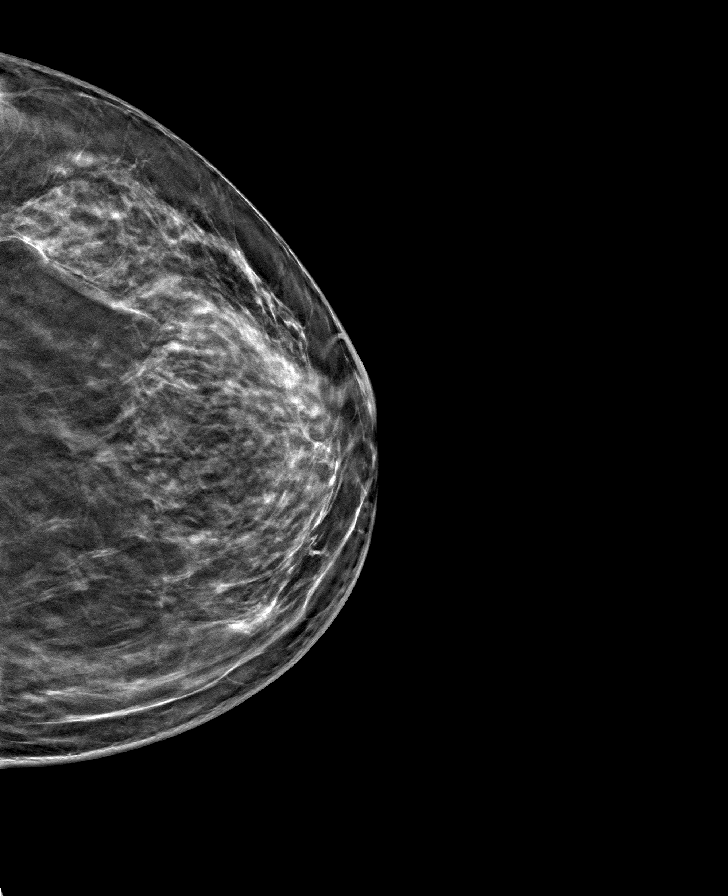

[8 of 24 positions shown; findings below may reference images not displayed]

ACR Breast Density Category b: There are scattered areas of
fibroglandular density.
FINDINGS: There are no findings suspicious for malignancy.
IMPRESSION: No mammographic evidence of malignancy. A result letter of this
screening mammogram will be mailed directly to the patient.

RECOMMENDATION:
Screening mammogram in one year. (Code:51-O-LD2)

BI-RADS CATEGORY  1: Negative.

## 2022-06-05 LAB — PROTEIN ELECTROPHORESIS, SERUM: Total Protein: 6.5 g/dL (ref 6.0–8.5)

## 2022-06-06 ENCOUNTER — Telehealth: Payer: Self-pay | Admitting: Nurse Practitioner

## 2022-06-06 NOTE — Telephone Encounter (Signed)
Lft pt daughter a vm to call ofc about NM bone scan whole body. thanks

## 2022-06-09 LAB — PROTEIN ELECTROPHORESIS, SERUM
A/G Ratio: 1.3 (ref 0.7–1.7)
Alpha 1: 0.2 g/dL (ref 0.0–0.4)
Gamma Globulin: 1 g/dL (ref 0.4–1.8)

## 2022-06-09 LAB — KAPPA/LAMBDA LIGHT CHAINS
Ig Kappa Free Light Chain: 21.1 mg/L — ABNORMAL HIGH (ref 3.3–19.4)
Ig Lambda Free Light Chain: 14.4 mg/L (ref 5.7–26.3)
KAPPA/LAMBDA RATIO: 1.47 (ref 0.26–1.65)

## 2022-06-09 LAB — IMMUNOFIXATION, SERUM
IgA/Immunoglobulin A, Serum: 198 mg/dL (ref 64–422)
IgG (Immunoglobin G), Serum: 1067 mg/dL (ref 586–1602)
IgM (Immunoglobulin M), Srm: 77 mg/dL (ref 26–217)

## 2022-06-12 ENCOUNTER — Encounter
Admission: RE | Admit: 2022-06-12 | Discharge: 2022-06-12 | Disposition: A | Discharge status: Home / Self Care | Payer: Medicare Other | Source: Ambulatory Visit | Attending: Nurse Practitioner | Admitting: Nurse Practitioner

## 2022-06-12 DIAGNOSIS — M899 Disorder of bone, unspecified: Secondary | ICD-10-CM | POA: Insufficient documentation

## 2022-06-12 MED ORDER — TECHNETIUM TC 99M MEDRONATE IV KIT
20.0000 | PACK | Freq: Once | INTRAVENOUS | Status: AC | PRN
  Administered 2022-06-12: 21.14 via INTRAVENOUS

## 2022-06-16 ENCOUNTER — Telehealth: Payer: Self-pay | Admitting: Nurse Practitioner

## 2022-06-16 NOTE — Telephone Encounter (Signed)
Called Dr Gerlene Burdock office and was given fax number # [phone]. Fax labs to them but bone density has not released as of yet.

## 2022-06-20 NOTE — Telephone Encounter (Signed)
Sent bone  scan test as well not bone density

## 2022-06-28 ENCOUNTER — Other Ambulatory Visit: Payer: Medicare Other

## 2022-07-12 DIAGNOSIS — M899 Disorder of bone, unspecified: Secondary | ICD-10-CM | POA: Diagnosis not present

## 2022-07-12 DIAGNOSIS — C9000 Multiple myeloma not having achieved remission: Secondary | ICD-10-CM | POA: Diagnosis not present

## 2022-07-12 DIAGNOSIS — R768 Other specified abnormal immunological findings in serum: Secondary | ICD-10-CM | POA: Diagnosis not present

## 2022-07-12 DIAGNOSIS — D649 Anemia, unspecified: Secondary | ICD-10-CM | POA: Diagnosis not present

## 2022-07-27 ENCOUNTER — Ambulatory Visit: Payer: Medicare Other | Admitting: Nurse Practitioner

## 2022-08-10 DIAGNOSIS — M899 Disorder of bone, unspecified: Secondary | ICD-10-CM | POA: Diagnosis not present

## 2022-08-10 DIAGNOSIS — C9000 Multiple myeloma not having achieved remission: Secondary | ICD-10-CM | POA: Diagnosis not present

## 2022-08-18 ENCOUNTER — Telehealth: Payer: Self-pay

## 2022-08-18 ENCOUNTER — Other Ambulatory Visit: Payer: Self-pay | Admitting: Nurse Practitioner

## 2022-08-18 DIAGNOSIS — F419 Anxiety disorder, unspecified: Secondary | ICD-10-CM

## 2022-08-18 NOTE — Telephone Encounter (Signed)
Prescription Request  08/18/2022  LOV: Visit date not found  What is the name of the medication or equipment? ALPRAZolam (XANAX) 0.5 MG tablet  Have you contacted your pharmacy to request a refill? Yes   Which pharmacy would you like this sent to?   [facility] - [place], [place] - [address] [place] [address] Phone: [phone] Fax: [phone]    Patient notified that their request is being sent to the clinical staff for review and that they should receive a response within 2 business days.   Please advise at [facility] [phone]  Patient states she only has about 10 tablets left.  Patient states this is a 90-day supply.

## 2022-08-22 NOTE — Telephone Encounter (Signed)
refilled 

## 2022-08-28 ENCOUNTER — Other Ambulatory Visit: Payer: Self-pay | Admitting: Nurse Practitioner

## 2022-08-29 NOTE — Telephone Encounter (Signed)
Rx sent to [facility] on 05/08/22, #90 with 2 refills.  Will confirm patient is needing refill sent to local pharmacy

## 2022-08-31 NOTE — Telephone Encounter (Signed)
Left message for patient to call for confirmation on need for local pharmacy

## 2022-11-09 NOTE — Telephone Encounter (Signed)
Error

## 2022-12-01 ENCOUNTER — Telehealth: Payer: Self-pay | Admitting: Nurse Practitioner

## 2022-12-01 NOTE — Telephone Encounter (Signed)
Copied from CRM [id number]. Topic: Medicare AWV >> Dec 01, 2022  1:49 PM Payton Doughty wrote: Reason for CRM: Called LVM 12/01/2022 to schedule Annual Wellness Visit  Verlee Rossetti; Care Guide Ambulatory Clinical Support [place] [facility] Direct Dial: [phone]

## 2022-12-12 ENCOUNTER — Other Ambulatory Visit: Payer: Self-pay | Admitting: Nurse Practitioner

## 2022-12-12 ENCOUNTER — Other Ambulatory Visit: Payer: Self-pay | Admitting: Cardiovascular Disease

## 2023-01-22 ENCOUNTER — Other Ambulatory Visit: Payer: Self-pay | Admitting: Student in an Organized Health Care Education/Training Program

## 2023-01-22 DIAGNOSIS — Z1231 Encounter for screening mammogram for malignant neoplasm of breast: Secondary | ICD-10-CM

## 2023-03-08 NOTE — Progress Notes (Unsigned)
 Cardiology Office Note   Date:  03/09/2023   ID:  Melissa English, DOB 07-29-1950, MRN [id number]  PCP:  Kara Dies, NP  Cardiologist:   Lorine Bears, MD   No chief complaint on file.     History of Present Illness: Melissa English is a 73 y.o. female who presents for a follow-up visit regarding coronary artery disease.   She has known history of hypertension, hyperlipidemia, sleep apnea on CPAP and hypothyroidism. She was evaluated in 2018 for atypical chest pain. CTA was negative for pulmonary embolism. Nuclear stress test was normal.  Echocardiogram showed normal LV systolic function and no significant abnormalities.  She was evaluated at that time by pulmonary and was suspected of having exercise-induced asthma.  Cardiac CTA in May 2023 showed significant proximal LAD stenosis and moderate left circumflex stenosis.  Calcium score was elevated at 613.  CT FFR showed no obstructive disease. She was treated medically given improvement and anginal symptoms and nonobstructive physiology by FFR.  She has been doing extremely well with no chest pain, shortness of breath or palpitations.  She continues to lose weight with healthy lifestyle and also she took [place].   Past Medical History:  Diagnosis Date   Anxiety    Arthritis 2018   hands, back...getting injections for back   CAD (coronary artery disease)    a. 08/2016 MV: EF 45%, no ischemia (EF 55-60% by echo); b. 05/2021 Cor CTA: LM nl, LAD >70p (FFRct 0.85), LCX 60-69p (FFRct 0.96), RCA non-dom, nl (FFRct 0.88). Cor Ca2+ = 613 (92nd %'ile).   GERD (gastroesophageal reflux disease)    Hiatal hernia    a. 05/2021 Cor CTA: Hiatal hernia incidentally noted.   History of echocardiogram    a. 09/2016 Echo: EF 55-60%, no rmwa; b. 05/2021 Echo: EF 55-60%, no rwma, GrI DD, nl RV fxn, mild MR.   HLD (hyperlipidemia)    HTN (hypertension)    Hypothyroidism    Sleep apnea    uses cpap    Past Surgical History:  Procedure  Laterality Date   ABDOMINAL HYSTERECTOMY     BACK SURGERY  11/05/2017   CARPAL TUNNEL RELEASE Right 2008   Dr. Kennith Center   CARPAL TUNNEL RELEASE Left 11/01/2016   Procedure: CARPAL TUNNEL RELEASE;  Surgeon: Deeann Saint, MD;  Location: [facility] ORS;  Service: Orthopedics;  Laterality: Left;   CHOLECYSTECTOMY     COLONOSCOPY WITH PROPOFOL N/A 10/03/2019   Procedure: COLONOSCOPY WITH PROPOFOL;  Surgeon: Pasty Spillers, MD;  Location: [facility] ENDOSCOPY;  Service: Endoscopy;  Laterality: N/A;   COLONOSCOPY WITH PROPOFOL N/A 02/21/2022   Procedure: COLONOSCOPY WITH PROPOFOL;  Surgeon: Wyline Mood, MD;  Location: [facility] ENDOSCOPY;  Service: Gastroenterology;  Laterality: N/A;   LUMBAR DISC SURGERY  1998   L4 - L5.  no metal in back   TOTAL VAGINAL HYSTERECTOMY  1998     Current Outpatient Medications  Medication Sig Dispense Refill   albuterol (PROVENTIL) (2.5 MG/3ML) 0.083% nebulizer solution Take 3 mLs (2.5 mg total) by nebulization every 6 (six) hours as needed for wheezing or shortness of breath. 150 mL 1   ALPRAZolam (XANAX) 0.5 MG tablet TAKE 1 TABLET BY MOUTH TWICE  DAILY AS NEEDED FOR ANXIETY 60 tablet 2   Ascorbic Acid (VITAMIN C PO) Take 1 tablet by mouth daily.     aspirin 81 MG tablet Take 81 mg by mouth daily.     atorvastatin (LIPITOR) 40 MG tablet TAKE 1 TABLET BY MOUTH  DAILY 100 tablet 0   Cholecalciferol (VITAMIN D-3 PO) Take 1 capsule by mouth daily.     ezetimibe (ZETIA) 10 MG tablet TAKE 1 TABLET BY MOUTH DAILY 90 tablet 2   FLUoxetine (PROZAC) 40 MG capsule Take 1 capsule (40 mg total) by mouth daily. 90 capsule 3   isosorbide mononitrate (IMDUR) 30 MG 24 hr tablet Take 0.5 tablets (15 mg total) by mouth daily. 45 tablet 3   levothyroxine (SYNTHROID) 75 MCG tablet Take 1 tablet (75 mcg total) by mouth daily. 90 tablet 3   nitroGLYCERIN (NITROSTAT) 0.4 MG SL tablet Place 1 tablet (0.4 mg total) under the tongue every 5 (five) minutes as needed for chest pain. 25 tablet 3    phentermine (ADIPEX-P) 37.5 MG tablet Take 37.5 mg by mouth daily before breakfast.     phentermine 15 MG capsule Take 37.5 mg by mouth daily.     metFORMIN (GLUCOPHAGE) 500 MG tablet Take 500 mg by mouth daily. (Patient not taking: Reported on 03/09/2023)     omeprazole (PRILOSEC) 20 MG capsule TAKE 1 CAPSULE BY MOUTH  DAILY (Patient not taking: Reported on 03/09/2023) 90 capsule 3   SUMAtriptan (IMITREX) 25 MG tablet TAKE 1 TABLET (25 MG TOTAL) BY MOUTH EVERY 8 (EIGHT) HOURS AS NEEDED FOR MIGRAINE. (Patient not taking: Reported on 03/09/2023) 10 tablet 0   No current facility-administered medications for this visit.    Allergies:   Codeine, Sulfa antibiotics, Other, Sulfasalazine, and Prednisone    Social History:  The patient  reports that she has never smoked. She has never used smokeless tobacco. She reports current alcohol use. She reports that she does not use drugs.   Family History:  The patient's family history is negative for coronary artery disease or sudden death.   ROS:  Please see the history of present illness.   Otherwise, review of systems are positive for none.   All other systems are reviewed and negative.    PHYSICAL EXAM: VS:  BP 124/70   Pulse 60   Ht 5\' 6"  (1.676 m)   Wt 173 lb (78.5 kg)   SpO2 97%   BMI 27.92 kg/m  , BMI Body mass index is 27.92 kg/m. GEN: Well nourished, well developed, in no acute distress  HEENT: normal  Neck: no JVD, carotid bruits, or masses Cardiac: RRR; no murmurs, rubs, or gallops,no edema  Respiratory:  clear to auscultation bilaterally, normal work of breathing GI: soft, nontender, nondistended, + BS MS: no deformity or atrophy  Skin: warm and dry, no rash Neuro:  Strength and sensation are intact Psych: euthymic mood, full affect   EKG:  EKG is ordered today. EKG showed: Normal sinus rhythm Normal ECG When compared with ECG of 28-Feb-2012 17:42, No significant change was found   Recent Labs: 05/31/2022: TSH 1.250     Lipid Panel    Component Value Date/Time   CHOL 121 09/05/2021 0910   CHOL 193 08/23/2011 0743   TRIG 62 09/05/2021 0910   TRIG 121 08/23/2011 0743   HDL 52 09/05/2021 0910   HDL 55 08/23/2011 0743   CHOLHDL 2.3 09/05/2021 0910   VLDL 12 09/05/2021 0910   VLDL 24 08/23/2011 0743   LDLCALC 57 09/05/2021 0910   LDLCALC 89 11/03/2020 1222   LDLCALC 114 (H) 08/23/2011 0743      Wt Readings from Last 3 Encounters:  03/09/23 173 lb (78.5 kg)  05/16/22 186 lb (84.4 kg)  04/25/22 184 lb 6.4 oz (83.6 kg)  08/31/2016    7:51 AM  PAD Screen  Previous PAD dx? No  Previous surgical procedure? No  Pain with walking? Yes  Subsides with rest? No  Feet/toe relief with dangling? No  Painful, non-healing ulcers? No  Extremities discolored? No      ASSESSMENT AND PLAN:  1.  Coronary artery disease involving native coronary arteries with other forms of angina: She is doing very well overall with no recurrent angina on small dose of Imdur.  This was refilled today.  2.  Essential hypertension: Lisinopril was discontinued due to symptomatic hypotension.  She is doing well on small dose of Imdur.  3 . Hyperlipidemia: I reviewed most recent lipid profile done in November which showed an LDL of 65 and triglyceride of 77.  We refilled atorvastatin and ezetimibe.  4.  Obstructive sleep apnea: On CPAP.    Disposition:   FU with me in 12 months  Signed,  Lorine Bears, MD  03/09/2023 8:13 AM    [facility]

## 2023-03-09 ENCOUNTER — Ambulatory Visit: Payer: Medicare Other | Attending: Cardiovascular Disease | Admitting: Cardiovascular Disease

## 2023-03-09 ENCOUNTER — Ambulatory Visit
Admission: RE | Admit: 2023-03-09 | Discharge: 2023-03-09 | Disposition: A | Discharge status: Home / Self Care | Payer: Medicare Other | Source: Ambulatory Visit | Attending: Student in an Organized Health Care Education/Training Program | Admitting: Student in an Organized Health Care Education/Training Program

## 2023-03-09 VITALS — BP 124/70 | HR 60 | Ht 66.0 in | Wt 173.0 lb

## 2023-03-09 DIAGNOSIS — E785 Hyperlipidemia, unspecified: Secondary | ICD-10-CM | POA: Diagnosis not present

## 2023-03-09 DIAGNOSIS — Z1231 Encounter for screening mammogram for malignant neoplasm of breast: Secondary | ICD-10-CM | POA: Diagnosis present

## 2023-03-09 DIAGNOSIS — I10 Essential (primary) hypertension: Secondary | ICD-10-CM | POA: Diagnosis not present

## 2023-03-09 DIAGNOSIS — I25118 Atherosclerotic heart disease of native coronary artery with other forms of angina pectoris: Secondary | ICD-10-CM

## 2023-03-09 MED ORDER — ISOSORBIDE MONONITRATE ER 30 MG PO TB24: 15.0000 mg | ORAL_TABLET | Freq: Every day | ORAL | 3 refills | Status: DC

## 2023-03-09 MED ORDER — ATORVASTATIN CALCIUM 40 MG PO TABS: 40.0000 mg | ORAL_TABLET | Freq: Every day | ORAL | 3 refills | Status: AC

## 2023-03-09 MED ORDER — EZETIMIBE 10 MG PO TABS: 10.0000 mg | ORAL_TABLET | Freq: Every day | ORAL | 3 refills | Status: DC

## 2023-03-09 MED ORDER — ATORVASTATIN CALCIUM 40 MG PO TABS: 40.0000 mg | ORAL_TABLET | Freq: Every day | ORAL | 3 refills | Status: DC

## 2023-03-09 NOTE — Addendum Note (Signed)
 Addended by: Sandi Mariscal on: 03/09/2023 08:59 AM   Modules accepted: Orders

## 2023-03-09 NOTE — Patient Instructions (Signed)
 Medication Instructions:  No changes *If you need a refill on your cardiac medications before your next appointment, please call your pharmacy*   Lab Work: None ordered If you have labs (blood work) drawn today and your tests are completely normal, you will receive your results only by: MyChart Message (if you have MyChart) OR A paper copy in the mail If you have any lab test that is abnormal or we need to change your treatment, we will call you to review the results.   Testing/Procedures: None ordered   Follow-Up: At [facility], you and your health needs are our priority.  As part of our continuing mission to provide you with exceptional heart care, we have created designated Provider Care Teams.  These Care Teams include your primary Cardiologist (physician) and Advanced Practice Providers (APPs -  Physician Assistants and Nurse Practitioners) who all work together to provide you with the care you need, when you need it.  We recommend signing up for the patient portal called "MyChart".  Sign up information is provided on this After Visit Summary.  MyChart is used to connect with patients for Virtual Visits (Telemedicine).  Patients are able to view lab/test results, encounter notes, upcoming appointments, etc.  Non-urgent messages can be sent to your provider as well.   To learn more about what you can do with MyChart, go to [email or web address].    Your next appointment:   12 month(s)  Provider:   You may see Lorine Bears, MD or one of the following Advanced Practice Providers on your designated Care Team:   Nicolasa Ducking, NP Eula Listen, PA-C Cadence Fransico Michael, PA-C Charlsie Quest, NP Carlos Levering, NP

## 2023-04-18 ENCOUNTER — Telehealth: Payer: Self-pay

## 2023-04-18 NOTE — Telephone Encounter (Signed)
 Called pt to get her scheduled a follow up appt with her PCP Kara Dies, NP however pt informed me that she changed PCP's and no longer will be coming here. I have removed charanpreet as PCP.

## 2023-10-12 ENCOUNTER — Other Ambulatory Visit: Payer: Self-pay | Admitting: Family Medicine

## 2023-10-12 DIAGNOSIS — S22000A Wedge compression fracture of unspecified thoracic vertebra, initial encounter for closed fracture: Secondary | ICD-10-CM

## 2023-10-13 ENCOUNTER — Ambulatory Visit
Admission: RE | Admit: 2023-10-13 | Discharge: 2023-10-13 | Disposition: A | Source: Ambulatory Visit | Attending: Family Medicine | Admitting: Family Medicine

## 2023-10-13 DIAGNOSIS — S22000A Wedge compression fracture of unspecified thoracic vertebra, initial encounter for closed fracture: Secondary | ICD-10-CM

## 2023-10-27 ENCOUNTER — Other Ambulatory Visit

## 2023-12-12 ENCOUNTER — Ambulatory Visit: Attending: Nurse Practitioner | Admitting: Nurse Practitioner

## 2023-12-12 ENCOUNTER — Ambulatory Visit

## 2023-12-12 ENCOUNTER — Encounter: Payer: Self-pay | Admitting: Nurse Practitioner

## 2023-12-12 VITALS — BP 100/68 | HR 64 | Ht 65.0 in | Wt 150.2 lb

## 2023-12-12 DIAGNOSIS — I2510 Atherosclerotic heart disease of native coronary artery without angina pectoris: Secondary | ICD-10-CM | POA: Diagnosis not present

## 2023-12-12 DIAGNOSIS — R55 Syncope and collapse: Secondary | ICD-10-CM | POA: Diagnosis not present

## 2023-12-12 DIAGNOSIS — E785 Hyperlipidemia, unspecified: Secondary | ICD-10-CM | POA: Diagnosis not present

## 2023-12-12 DIAGNOSIS — I10 Essential (primary) hypertension: Secondary | ICD-10-CM | POA: Diagnosis not present

## 2023-12-12 DIAGNOSIS — I951 Orthostatic hypotension: Secondary | ICD-10-CM

## 2023-12-12 NOTE — Progress Notes (Signed)
 Office Visit    Patient Name: Melissa English Date of Encounter: 12/12/2023  Primary Care Provider:  Pcp, No Primary Cardiologist:  Deatrice Cage, MD  Cardiology APP:  Vivienne Lonni Ingle, NP   Chief Complaint    73 y.o. female w/ a h/o HTN, HL, OSA on CPAP, hypothyroidism, MGUS and nonobstructive CAD, who presents for f/u related to syncope.  Past Medical History   Subjective   Past Medical History:  Diagnosis Date   Anxiety    Arthritis 2018   hands, back...getting injections for back   CAD (coronary artery disease)    a. 08/2016 MV: EF 45%, no ischemia (EF 55-60% by echo); b. 05/2021 Cor CTA: LM nl, LAD >70p (FFRct 0.85), LCX 60-69p (FFRct 0.96), RCA non-dom, nl (FFRct 0.88). Cor Ca2+ = 613 (92nd %'ile).   GERD (gastroesophageal reflux disease)    Hiatal hernia    a. 05/2021 Cor CTA: Hiatal hernia incidentally noted.   History of echocardiogram    a. 09/2016 Echo: EF 55-60%, no rmwa; b. 05/2021 Echo: EF 55-60%, no rwma, GrI DD, nl RV fxn, mild MR.   HLD (hyperlipidemia)    HTN (hypertension)    Hypothyroidism    Sinus bradycardia    a. 07/2023 Holter: Predominantly RSR, avg rate of 61, rare PVCs and PACs, and 4 brief episodes of SVT up to 126 bpm x 10 beats.   Sleep apnea    uses cpap   Syncope    Past Surgical History:  Procedure Laterality Date   ABDOMINAL HYSTERECTOMY     BACK SURGERY  11/05/2017   CARPAL TUNNEL RELEASE Right 2008   Dr. Dallie   CARPAL TUNNEL RELEASE Left 11/01/2016   Procedure: CARPAL TUNNEL RELEASE;  Surgeon: Cleotilde Barrio, MD;  Location: [facility] ORS;  Service: Orthopedics;  Laterality: Left;   CHOLECYSTECTOMY     COLONOSCOPY WITH PROPOFOL  N/A 10/03/2019   Procedure: COLONOSCOPY WITH PROPOFOL ;  Surgeon: Janalyn Keene NOVAK, MD;  Location: [facility] ENDOSCOPY;  Service: Endoscopy;  Laterality: N/A;   COLONOSCOPY WITH PROPOFOL  N/A 02/21/2022   Procedure: COLONOSCOPY WITH PROPOFOL ;  Surgeon: Therisa Bi, MD;  Location: [facility] ENDOSCOPY;  Service:  Gastroenterology;  Laterality: N/A;   LUMBAR DISC SURGERY  1998   L4 - L5.  no metal in back   TOTAL VAGINAL HYSTERECTOMY  1998    Allergies  Allergies  Allergen Reactions   Codeine Nausea And Vomiting, Other (See Comments) and Nausea Only    Severe n & v   Sulfa Antibiotics Nausea And Vomiting and Other (See Comments)    Occurred a long time ago so patient is unsure of reaction  Substance with sulfonamide structure and antibacterial mechanism of action (substance)   Other Other (See Comments)   Sulfasalazine    Prednisone Other (See Comments)    Could not sleep and felt nervous.       History of Present Illness      73 y.o. y/o female w/ a h/o HTN, HL, OSA on CPAP, hypothyroidism, MGUS, and nonobstructive CAD. She previously underwent stress testing in 08/2016, which showed no evidence of ischemia or infarct. EF was 45% and follow-up echo showed an EF of 55 to 60%. She was also evaluated by pulmonology at that time and was suspected of having exercise-induced asthma. In May 2023, she reported left-sided chest pain. Coronary CT angiogram showed greater than 70% proximal LAD stenosis and a 60 to 69% proximal left circumflex stenosis. Coronary calcium  was elevated at 613 (92nd percentile).  FFR CT analysis was normal in the LAD (0.85), left circumflex (0.96), and RCA (0.88). She was placed on long-acting nitrate therapy with improvement in symptoms and activity tolerance.     [name] was last seen in cardiology clinic in February 2025, at which time she was doing well.  Earlier this year, in the setting of headaches and facial numbness, she was seen by neurology and had an abnormal MRI that showed a 2 cm right parietal skull lesion.  She has since been followed by [facility] oncology.  Labs in May 2025 showed no monoclonal component and bone scan showed no suspicious uptake in the parietal bone though did show uptake in the right 11th rib and left seventh rib (could reflect sequela of trauma).   There was also faint increased uptake in the left acetabulum favored to be degenerative.  She was diagnosed with MGUS.    In July 2025, she was seen at the [facility] emergency department due to complaints of fatigue.  Workup was unremarkable.  She subsequently underwent event monitoring in the setting of bradycardia with a heart rate of 49 at her primary care office visit, with monitoring showing sinus rhythm at an average rate of 61, and rare PVCs and PACs, and 4 brief episodes of SVT up to 126 bpm x 10 beats.    More recently, in early November, she was in her kitchen just cleaning up, and had sudden onset of syncope without any prodrome.  She does not know how long she was without consciousness but regained consciousness on the floor.  She is significant pain involving her left rib cage.  She did not experience any chest pain, dyspnea, nausea, vomiting, or diaphoresis.  She was later seen by her primary care provider and referred to a [facility] urgent care where chest x-ray showed mildly displaced fractures of the left lateral 9th and 10th ribs.  She has not had any recurrence of presyncope or syncope and notes that she remains active without symptoms or limitations.  She denies chest pain, dyspnea, palpitations, PND, orthopnea, edema, or early satiety.  She is interested in pursuing an implantable loop recorder. Objective   Home Medications    Current Outpatient Medications  Medication Sig Dispense Refill   albuterol  (PROVENTIL ) (2.5 MG/3ML) 0.083% nebulizer solution Take 3 mLs (2.5 mg total) by nebulization every 6 (six) hours as needed for wheezing or shortness of breath. 150 mL 1   ALPRAZolam  (XANAX ) 0.5 MG tablet TAKE 1 TABLET BY MOUTH TWICE  DAILY AS NEEDED FOR ANXIETY 60 tablet 2   Ascorbic Acid (VITAMIN C PO) Take 1 tablet by mouth daily.     aspirin 81 MG tablet Take 81 mg by mouth daily.     atorvastatin  (LIPITOR) 40 MG tablet Take 1 tablet (40 mg total) by mouth daily. 90 tablet 3    cetirizine (ZYRTEC) 10 MG tablet Take 10 mg by mouth daily.     Cholecalciferol (VITAMIN D -3 PO) Take 1 capsule by mouth daily.     ezetimibe  (ZETIA ) 10 MG tablet Take 1 tablet (10 mg total) by mouth daily. 90 tablet 3   famotidine  (PEPCID ) 40 MG tablet Take 40 mg by mouth at bedtime.     FLUoxetine  (PROZAC ) 40 MG capsule Take 1 capsule (40 mg total) by mouth daily. 90 capsule 3   levothyroxine  (SYNTHROID ) 75 MCG tablet Take 1 tablet (75 mcg total) by mouth daily. 90 tablet 3   magnesium oxide (MAG-OX) 400 MG tablet Take 400 mg  by mouth as needed.     meloxicam  (MOBIC ) 7.5 MG tablet Take 7.5 mg by mouth daily.     methocarbamol (ROBAXIN) 500 MG tablet Take 500 mg by mouth 2 (two) times daily as needed.     MOUNJARO 2.5 MG/0.5ML Pen Inject 2.5 mg into the skin once a week.     nitroGLYCERIN  (NITROSTAT ) 0.4 MG SL tablet Place 1 tablet (0.4 mg total) under the tongue every 5 (five) minutes as needed for chest pain. 25 tablet 3   phentermine 15 MG capsule Take 37.5 mg by mouth daily.     traMADol (ULTRAM) 50 MG tablet Take 50 mg by mouth as needed.     phentermine (ADIPEX-P) 37.5 MG tablet Take 37.5 mg by mouth daily before breakfast. (Patient not taking: Reported on 12/12/2023)     SUMAtriptan  (IMITREX ) 25 MG tablet TAKE 1 TABLET (25 MG TOTAL) BY MOUTH EVERY 8 (EIGHT) HOURS AS NEEDED FOR MIGRAINE. (Patient not taking: Reported on 12/12/2023) 10 tablet 0   No current facility-administered medications for this visit.     Physical Exam    VS:  BP 100/68 (BP Location: Left Arm, Patient Position: Sitting, Cuff Size: Normal)   Pulse 64   Ht 5' 5 (1.651 m)   Wt 150 lb 4 oz (68.2 kg)   SpO2 97%   BMI 25.00 kg/m  , BMI Body mass index is 25 kg/m. Orthostatic VS for the past 24 hrs (Last 3 readings):  BP- Lying Pulse- Lying BP- Sitting Pulse- Sitting BP- Standing at 0 minutes Pulse- Standing at 0 minutes BP- Standing at 3 minutes Pulse- Standing at 3 minutes  12/12/23 1119 128/79 64 122/73 65 105/70  71 111/74 74          GEN: Well nourished, well developed, in no acute distress. HEENT: normal. Neck: Supple, no JVD, carotid bruits, or masses. Cardiac: RRR, no murmurs, rubs, or gallops. No clubbing, cyanosis, edema.  Radials 2+/PT 2+ and equal bilaterally.  Respiratory:  Respirations regular and unlabored, clear to auscultation bilaterally. GI: Soft, nontender, nondistended, BS + x 4. MS: no deformity or atrophy. Skin: warm and dry, no rash. Neuro:  Strength and sensation are intact. Psych: Normal affect.  Accessory Clinical Findings    ECG personally reviewed by me today - EKG Interpretation Date/Time:  Wednesday December 12 2023 11:17:45 EST Ventricular Rate:  64 PR Interval:  194 QRS Duration:  72 QT Interval:  406 QTC Calculation: 418 R Axis:   34  Text Interpretation: Normal sinus rhythm Low voltage QRS Confirmed by Vivienne Bruckner [phone]) on 12/12/2023 11:33:02 AM   - no acute changes.  Lab Results  Component Value Date   WBC 7.2 03/08/2022   HGB 11.2 (L) 03/08/2022   HCT 35.2 (L) 03/08/2022   MCV 93.4 03/08/2022   PLT 195 03/08/2022   Lab Results  Component Value Date   CREATININE 0.85 09/05/2021   BUN 20 09/05/2021   NA 138 09/05/2021   K 4.3 09/05/2021   CL 104 09/05/2021   CO2 27 09/05/2021   Lab Results  Component Value Date   ALT 22 09/05/2021   AST 21 09/05/2021   GGT 20 05/03/2020   ALKPHOS 110 09/05/2021   BILITOT 1.5 (H) 09/05/2021   Lab Results  Component Value Date   CHOL 121 09/05/2021   HDL 52 09/05/2021   LDLCALC 57 09/05/2021   TRIG 62 09/05/2021   CHOLHDL 2.3 09/05/2021    Lab Results  Component Value Date  TSH 1.250 05/31/2022       Assessment & Plan    1.  Syncope: Patient suffered a syncopal spell while at home in November 2025.  She was doing some work in her kitchen and suddenly lost consciousness without any prodrome.  Unclear duration of period of time without consciousness.  She did not have chest pain dyspnea  palpitations, nausea, vomiting, or diaphoresis surrounding the event.  No recurrent symptoms.  She previously wore an event monitor over the summer 2025 in the setting of sinus bradycardia which showed predominantly sinus rhythm with an average rate of 61, rare PVCs and PACs, and 4 brief episodes of SVT, but no sustained or significant arrhythmias.  We discussed options for evaluation today.  I am concerned about her symptoms and think she would benefit from an implantable loop recorder.  We were able to get her scheduled to see electrophysiology on January 12.  In the interim, we will place a 2-week ZIO monitor to reevaluate for sustained arrhythmias.  Follow-up echocardiogram.  2.  Primary hypertension/orthostatic hypotension: Patient mildly orthostatic today with a drop in blood pressure from 128 lying to 105 immediately upon standing, with recovery of 111 after a few minutes.  She was completely asymptomatic.  In the setting of orthostasis, going to stop her isosorbide  mononitrate, though it does not seem likely that orthostasis contributed to her syncope in November as she does not routinely experience orthostatic lightheadedness and did not have any that day.  Of note, lisinopril  was previously discontinued due to symptomatic hypotension.  3.  Hyperlipidemia: LDL of 65 last November.  She remains on statin therapy and is followed by primary care.  4.  Obstructive sleep apnea: On CPAP.  5.  Disposition: Follow-up ZIO monitor.  Follow-up with electrophysiology.  Lonni Meager, NP 12/12/2023, 12:42 PM

## 2023-12-12 NOTE — Patient Instructions (Signed)
 Medication Instructions:   Your physician has recommended you make the following change in your medication:   STOP- Isosorbide   *If you need a refill on your cardiac medications before your next appointment, please call your pharmacy*  Lab Work:  NONE  If you have labs (blood work) drawn today and your tests are completely normal, you will receive your results only by: MyChart Message (if you have MyChart) OR A paper copy in the mail If you have any lab test that is abnormal or we need to change your treatment, we will call you to review the results.  Testing/Procedures:  Your physician has requested that you have an echocardiogram. Echocardiography is a painless test that uses sound waves to create images of your heart. It provides your doctor with information about the size and shape of your heart and how well your heart's chambers and valves are working.   You may receive an ultrasound enhancing agent through an IV if needed to better visualize your heart during the echo. This procedure takes approximately one hour.  There are no restrictions for this procedure.  This will take place at [address] [facility] [address] [facility], [place] [address]  Please note: We ask at that you not bring children with you during ultrasound (echo/ vascular) testing. Due to room size and safety concerns, children are not allowed in the ultrasound rooms during exams. Our front office staff cannot provide observation of children in our lobby area while testing is being conducted. An adult accompanying a patient to their appointment will only be allowed in the ultrasound room at the discretion of the ultrasound technician under special circumstances. We apologize for any inconvenience.   ZIO AT Long term monitor-Live Telemetry  Your physician has requested you wear a ZIO patch monitor for 14 days.  This is a single patch monitor. [employer] supplies one patch monitor per enrollment. Additional   stickers are not available.  Please do not apply patch if you will be having a Nuclear Stress Test, Echocardiogram, Cardiac CT, MRI,  or Chest Xray during the period you would be wearing the monitor. The patch cannot be worn during  these tests. You cannot remove and re-apply the ZIO AT patch monitor.  Your ZIO patch monitor will be mailed 3 day USPS to your address on file. It may take 3-5 days to  receive your monitor after you have been enrolled.  Once you have received your monitor, please review the enclosed instructions. Your monitor has  already been registered assigning a specific monitor serial # to you.   Billing and Patient Assistance Program information  [facility] has been supplied with any insurance information on record for billing. [employer] offers a sliding scale Patient Assistance Program for patients without insurance, or whose  insurance does not completely cover the cost of the ZIO patch monitor. You must apply for the  Patient Assistance Program to qualify for the discounted rate. To apply, call [employer] at [phone],  select option 4, select option 2 , ask to apply for the Patient Assistance Program, (you can request an  interpreter if needed). [employer] will ask your household income and how many people are in your  household. [employer] will quote your out-of-pocket cost based on this information. They will also be able  to set up a 12 month interest free payment plan if needed.  Applying the monitor   Shave hair from upper left chest.  Hold the abrader disc by orange tab. Rub the abrader  in 40 strokes over left upper chest as indicated in  your monitor instructions.  Clean area with 4 enclosed alcohol pads. Use all pads to ensure the area is cleaned thoroughly. Let  dry.  Apply patch as indicated in monitor instructions. Patch will be placed under collarbone on left side of  chest with arrow pointing upward.  Rub patch adhesive wings for 2 minutes. Remove the white  label marked 1. Remove the white label  marked 2. Rub patch adhesive wings for 2 additional minutes.  While looking in a mirror, press and release button in center of patch. A small green light will flash 3-4  times. This will be your only indicator that the monitor has been turned on.  Do not shower for the first 24 hours. You may shower after the first 24 hours.  Press the button if you feel a symptom. You will hear a small click. Record Date, Time and Symptom in  the Patient Log.   Starting the Gateway  In your kit there is a audiological scientist box the size of a cellphone. This is [employer]. It transmits all your  recorded data to [facility]. This box must always stay within 10 feet of you. Open the box and push the *  button. There will be a light that blinks orange and then green a few times. When the light stops  blinking, the Gateway is connected to the ZIO patch. Call [employer] at [phone] to confirm your monitor is transmitting.  Returning your monitor  Remove your patch and place it inside the Gateway. In the lower half of the Gateway there is a white  bag with prepaid postage on it. Place Gateway in bag and seal. Mail package back to [place] as soon as  possible. Your physician should have your final report approximately 7 days after you have mailed back  your monitor. Call [facility] Customer Care at [phone] if you have questions regarding your ZIO AT  patch monitor. Call them immediately if you see an orange light blinking on your monitor.  If your monitor falls off in less than 4 days, contact our Monitor department at [phone]. If your  monitor becomes loose or falls off after 4 days call [employer] at [phone] for suggestions on  securing your monitor    Follow-Up: At [facility], you and your health needs are our priority.  As part of our continuing mission to provide you with exceptional heart care, our providers are all part of one  team.  This team includes your primary Cardiologist (physician) and Advanced Practice Providers or APPs (Physician Assistants and Nurse Practitioners) who all work together to provide you with the care you need, when you need it.  Your next appointment:   1 month(s)  Provider:   Lonni Meager, NP    We recommend signing up for the patient portal called MyChart.  Sign up information is provided on this After Visit Summary.  MyChart is used to connect with patients for Virtual Visits (Telemedicine).  Patients are able to view lab/test results, encounter notes, upcoming appointments, etc.  Non-urgent messages can be sent to your provider as well.   To learn more about what you can do with MyChart, go to [email or web address].   Other Instructions  EP Referral for Loop recorder

## 2024-01-05 ENCOUNTER — Other Ambulatory Visit: Payer: Self-pay | Admitting: Cardiovascular Disease

## 2024-01-16 ENCOUNTER — Ambulatory Visit: Payer: Self-pay | Admitting: Nurse Practitioner

## 2024-01-16 ENCOUNTER — Ambulatory Visit: Attending: Nurse Practitioner

## 2024-01-16 DIAGNOSIS — R55 Syncope and collapse: Secondary | ICD-10-CM | POA: Diagnosis not present

## 2024-01-16 LAB — ECHOCARDIOGRAM COMPLETE
AR max vel: 2.41 cm2
AV Area VTI: 2.08 cm2
AV Area mean vel: 2.29 cm2
AV Mean grad: 4 mmHg
AV Peak grad: 7.1 mmHg
Ao pk vel: 1.33 m/s
Area-P 1/2: 2.8 cm2
S' Lateral: 2.6 cm

## 2024-01-21 ENCOUNTER — Ambulatory Visit: Attending: Cardiology | Admitting: Cardiovascular Disease

## 2024-01-21 ENCOUNTER — Encounter: Payer: Self-pay | Admitting: Cardiovascular Disease

## 2024-01-21 VITALS — BP 122/80 | HR 62 | Ht 65.0 in | Wt 154.0 lb

## 2024-01-21 DIAGNOSIS — R55 Syncope and collapse: Secondary | ICD-10-CM | POA: Diagnosis not present

## 2024-01-21 NOTE — Progress Notes (Signed)
 " Electrophysiology Office Note:    Date:  01/21/2024   ID:  Melissa English, DOB 1950/02/12, MRN [id number]  PCP:  Freddrick Johns   [facility] Providers Cardiologist:  Deatrice Cage, MD Cardiology APP:  Vivienne Lonni Ingle, NP     Referring MD: Vivienne Lonni Ingle, NP   History of Present Illness:    Melissa English is a 74 y.o. female with a medical history significant for syncope, referred for possible loop recorder placement.      Discussed the use of AI scribe software for clinical note transcription with the patient, who gave verbal consent to proceed.  History of Present Illness Melissa English is a 74 year old female who presents with a recent episode of syncope.  On November 7th, she had a sudden syncopal episode while standing and preparing lunch. She lost consciousness without warning and was found on the floor by her daughter-in-law. She was unconscious for about a minute. She did not hit her head or sustain injury. She denies prior syncope or unexplained dizziness except during past cortisone injections. That morning she took a muscle relaxer, either methocarbamol or Flexeril, for back pain, a medication she has tolerated previously. She had eaten breakfast and felt well before the event.  Her past medical history includes chronic back pain with prior disc surgery and vertigo that has not caused loss of consciousness.  Recent echocardiogram reportedly showed an ejection fraction of 40-45%, decreased from 55-60%. Ambulatory rhythm monitoring showed some abnormal beats without significant conduction defects.  She has had no recurrent dizziness, lightheadedness, or syncope since the event. She continues to have chronic back pain and known vertigo.         Today, she reports that she feels well and has no acute complaints  EKGs/Labs/Other Studies Reviewed Today:     Echocardiogram:  TTE January 16, 2024 LVEF 40 to 45%.  Grade 1 diastolic dysfunction.   Normal RV size and function.  Normal mitral and aortic structure and function.   Monitors:  12 day monitor December 2025-- my interpretation Sinus rhythm heart rate 47 to 132 bpm, average 65 bpm Less than 1% ectopy No symptoms reported No sustained arrhythmia    EKG:   EKG Interpretation Date/Time:  Monday January 21 2024 14:10:03 EST Ventricular Rate:  62 PR Interval:  194 QRS Duration:  84 QT Interval:  442 QTC Calculation: 448 R Axis:   68  Text Interpretation: Sinus rhythm with occasional Premature ventricular complexes When compared with ECG of 12-Dec-2023 11:17, Premature ventricular complexes are now Present Confirmed by Nancey Scotts [phone]) on 01/21/2024 2:33:24 PM     Physical Exam:    VS:  BP 122/80 (BP Location: Left Arm, Patient Position: Sitting, Cuff Size: Normal)   Pulse 62   Ht 5' 5 (1.651 m)   Wt 154 lb (69.9 kg)   SpO2 97%   BMI 25.63 kg/m     Wt Readings from Last 3 Encounters:  01/21/24 154 lb (69.9 kg)  12/12/23 150 lb 4 oz (68.2 kg)  03/09/23 173 lb (78.5 kg)     GEN: Well nourished, well developed in no acute distress CARDIAC: RRR, no murmurs, rubs, gallops RESPIRATORY:  Normal work of breathing MUSCULOSKELETAL: no edema    ASSESSMENT & PLAN:     Syncope Single episode but no prodrome, presentation concerning for malignant arrhythmia Reduced EF, 40-45% We discussed indication for loop recorder.  I explained implant procedure and risks including infection, minor bleeding.  I explained the monitoring process with the associated fee. She would like to schedule the procedure today.  Hypertension, orthostatic hypotension Isosorbide  mononitrate discontinued  Obstructive sleep apnea On CPAP     Signed, Eulas FORBES Furbish, MD  01/21/2024 2:49 PM    [facility] "

## 2024-01-21 NOTE — Patient Instructions (Signed)
 Medication Instructions:  Your physician recommends that you continue on your current medications as directed. Please refer to the Current Medication list given to you today.  *If you need a refill on your cardiac medications before your next appointment, please call your pharmacy*  Lab Work: None  If you have labs (blood work) drawn today and your tests are completely normal, you will receive your results only by: MyChart Message (if you have MyChart) OR A paper copy in the mail If you have any lab test that is abnormal or we need to change your treatment, we will call you to review the results.  Testing/Procedures: None ordered.   Follow-Up: At [facility], you and your health needs are our priority.  As part of our continuing mission to provide you with exceptional heart care, our providers are all part of one team.  This team includes your primary Cardiologist (physician) and Advanced Practice Providers or APPs (Physician Assistants and Nurse Practitioners) who all work together to provide you with the care you need, when you need it.  Your next appointment:   To be scheduled

## 2024-01-23 ENCOUNTER — Ambulatory Visit: Attending: Nurse Practitioner | Admitting: Nurse Practitioner

## 2024-01-23 ENCOUNTER — Encounter: Payer: Self-pay | Admitting: Nurse Practitioner

## 2024-01-23 VITALS — BP 100/60 | HR 60 | Ht 65.0 in | Wt 155.0 lb

## 2024-01-23 DIAGNOSIS — I2510 Atherosclerotic heart disease of native coronary artery without angina pectoris: Secondary | ICD-10-CM

## 2024-01-23 DIAGNOSIS — R55 Syncope and collapse: Secondary | ICD-10-CM | POA: Diagnosis not present

## 2024-01-23 DIAGNOSIS — Z79899 Other long term (current) drug therapy: Secondary | ICD-10-CM

## 2024-01-23 DIAGNOSIS — I951 Orthostatic hypotension: Secondary | ICD-10-CM

## 2024-01-23 DIAGNOSIS — E785 Hyperlipidemia, unspecified: Secondary | ICD-10-CM | POA: Diagnosis not present

## 2024-01-23 DIAGNOSIS — I429 Cardiomyopathy, unspecified: Secondary | ICD-10-CM | POA: Diagnosis not present

## 2024-01-23 DIAGNOSIS — I10 Essential (primary) hypertension: Secondary | ICD-10-CM | POA: Diagnosis not present

## 2024-01-23 MED ORDER — DAPAGLIFLOZIN PROPANEDIOL 10 MG PO TABS: 10.0000 mg | ORAL_TABLET | Freq: Every day | ORAL | 3 refills | Status: AC

## 2024-01-23 NOTE — Progress Notes (Signed)
 "    Office Visit    Patient Name: Melissa English Date of Encounter: 01/23/2024  Primary Care Provider:  Pcp, No Primary Cardiologist:  Deatrice Cage, MD  Cardiology APP:  Vivienne Lonni Ingle, NP   Chief Complaint    74 y.o. female w/ a h/o HTN, HL, OSA on CPAP, hypothyroidism, MGUS and nonobstructive CAD, who presents for f/u related to cardiomyopathy.  Past Medical History   Subjective   Past Medical History:  Diagnosis Date   Anxiety    Arthritis 2018   hands, back...getting injections for back   CAD (coronary artery disease)    a. 08/2016 MV: EF 45%, no ischemia (EF 55-60% by echo); b. 05/2021 Cor CTA: LM nl, LAD >70p (FFRct 0.85), LCX 60-69p (FFRct 0.96), RCA non-dom, nl (FFRct 0.88). Cor Ca2+ = 613 (92nd %'ile).   Cardiomyopathy (HCC)    a. 09/2016 Echo: EF 55-60%, no rmwa; b. 05/2021 Echo: EF 55-60%, no rwma, GrI DD, nl RV fxn, mild MR; c. 01/2024 Echo: EF of 40 to 45% w/o rwma, gr1 DD, normal RV function, and mild MR.   GERD (gastroesophageal reflux disease)    Hiatal hernia    a. 05/2021 Cor CTA: Hiatal hernia incidentally noted.   HLD (hyperlipidemia)    HTN (hypertension)    Hypothyroidism    Sinus bradycardia    a. 07/2023 Holter: Predominantly RSR, avg rate of 61, rare PVCs and PACs, and 4 brief episodes of SVT up to 126 bpm x 10 beats.   Sleep apnea    uses cpap   Syncope    Past Surgical History:  Procedure Laterality Date   ABDOMINAL HYSTERECTOMY     BACK SURGERY  11/05/2017   CARPAL TUNNEL RELEASE Right 2008   Dr. Dallie   CARPAL TUNNEL RELEASE Left 11/01/2016   Procedure: CARPAL TUNNEL RELEASE;  Surgeon: Cleotilde Barrio, MD;  Location: [facility] ORS;  Service: Orthopedics;  Laterality: Left;   CHOLECYSTECTOMY     COLONOSCOPY WITH PROPOFOL  N/A 10/03/2019   Procedure: COLONOSCOPY WITH PROPOFOL ;  Surgeon: Janalyn Keene NOVAK, MD;  Location: [facility] ENDOSCOPY;  Service: Endoscopy;  Laterality: N/A;   COLONOSCOPY WITH PROPOFOL  N/A 02/21/2022   Procedure:  COLONOSCOPY WITH PROPOFOL ;  Surgeon: Therisa Bi, MD;  Location: [facility] ENDOSCOPY;  Service: Gastroenterology;  Laterality: N/A;   LUMBAR DISC SURGERY  1998   L4 - L5.  no metal in back   TOTAL VAGINAL HYSTERECTOMY  1998    Allergies  Allergies[1]     History of Present Illness      73 y.o. y/o female w/ a h/o HTN, HL, OSA on CPAP, hypothyroidism, MGUS, and nonobstructive CAD. She previously underwent stress testing in 08/2016, which showed no evidence of ischemia or infarct. EF was 45% and follow-up echo showed an EF of 55 to 60%. She was also evaluated by pulmonology at that time and was suspected of having exercise-induced asthma. In May 2023, she reported left-sided chest pain. Coronary CT angiogram showed greater than 70% proximal LAD stenosis and a 60 to 69% proximal left circumflex stenosis. Coronary calcium  was elevated at 613 (92nd percentile). FFR CT analysis was normal in the LAD (0.85), left circumflex (0.96), and RCA (0.88). She was placed on long-acting nitrate therapy with improvement in symptoms and activity tolerance.     In early 2025, in the setting of headaches and facial numbness, she was seen by neurology and had an abnormal MRI that showed a 2 cm right parietal skull lesion. She has since  been followed by [facility] oncology. Labs in May 2025 showed no monoclonal component and bone scan showed no suspicious uptake in the parietal bone though did show uptake in the right 11th rib and left seventh rib (could reflect sequela of trauma). There was also faint increased uptake in the left acetabulum favored to be degenerative. She was diagnosed with MGUS.   In July 2025, she was seen at the [facility] emergency department due to complaints of fatigue.  Workup was unremarkable.  She subsequently underwent event monitoring in the setting of bradycardia with a heart rate of 49 at her primary care office visit, with monitoring showing sinus rhythm at an average rate of 61, and rare PVCs  and PACs, and 4 brief episodes of SVT up to 126 bpm x 10 beats.  She had recurrent syncope without prodrome in 11/2023, which resulted in mildly displaced fractures of the L lateral 9th and 10th ribs.  Repeat echocardiography in January 2026 showed an EF of 40 to 45% without regional wall motion abnormalities, grade 1 diastolic dysfunction, normal RV function, and mild MR.    Since her last visit, [name] has felt well without recurrent syncope.  She did see electrophysiology earlier this week to discuss implantable loop recorder however after returning home and recognizing what the monthly cost would be, she would like to hold off for now.  She notes that she remains active, cleaning homes regularly and denies any symptoms or limitations such as chest pain or dyspnea.  She denies palpitations, PND, orthopnea, dizziness, syncope, edema, or early satiety.  We discussed her echo findings in detail today.  She is amenable to repeating coronary CT angiogram. Objective   Home Medications    Current Outpatient Medications  Medication Sig Dispense Refill   albuterol  (PROVENTIL ) (2.5 MG/3ML) 0.083% nebulizer solution Take 3 mLs (2.5 mg total) by nebulization every 6 (six) hours as needed for wheezing or shortness of breath. 150 mL 1   ALPRAZolam  (XANAX ) 0.5 MG tablet TAKE 1 TABLET BY MOUTH TWICE  DAILY AS NEEDED FOR ANXIETY 60 tablet 2   Ascorbic Acid (VITAMIN C PO) Take 1 tablet by mouth daily.     aspirin 81 MG tablet Take 81 mg by mouth daily.     atorvastatin  (LIPITOR) 40 MG tablet Take 1 tablet (40 mg total) by mouth daily. 90 tablet 3   cetirizine (ZYRTEC) 10 MG tablet Take 10 mg by mouth daily.     Cholecalciferol (VITAMIN D -3 PO) Take 1 capsule by mouth daily.     ezetimibe  (ZETIA ) 10 MG tablet TAKE 1 TABLET BY MOUTH DAILY 100 tablet 0   famotidine  (PEPCID ) 40 MG tablet Take 40 mg by mouth at bedtime.     FLUoxetine  (PROZAC ) 40 MG capsule Take 1 capsule (40 mg total) by mouth daily. 90 capsule 3    isosorbide  mononitrate (IMDUR ) 30 MG 24 hr tablet Take 15 mg by mouth daily.     levothyroxine  (SYNTHROID ) 75 MCG tablet Take 1 tablet (75 mcg total) by mouth daily. 90 tablet 3   meloxicam  (MOBIC ) 7.5 MG tablet Take 7.5 mg by mouth daily.     methocarbamol (ROBAXIN) 500 MG tablet Take 500 mg by mouth 2 (two) times daily as needed.     MOUNJARO 2.5 MG/0.5ML Pen Inject 2.5 mg into the skin once a week.     nitroGLYCERIN  (NITROSTAT ) 0.4 MG SL tablet Place 1 tablet (0.4 mg total) under the tongue every 5 (five) minutes as needed for  chest pain. 25 tablet 3   SUMAtriptan  (IMITREX ) 25 MG tablet TAKE 1 TABLET (25 MG TOTAL) BY MOUTH EVERY 8 (EIGHT) HOURS AS NEEDED FOR MIGRAINE. 10 tablet 0   traMADol (ULTRAM) 50 MG tablet Take 50 mg by mouth as needed.     phentermine (ADIPEX-P) 37.5 MG tablet Take 37.5 mg by mouth daily before breakfast. (Patient not taking: Reported on 01/21/2024)     phentermine 15 MG capsule Take 37.5 mg by mouth daily. (Patient not taking: Reported on 01/21/2024)     No current facility-administered medications for this visit.     Physical Exam    VS:  BP 100/60 (BP Location: Left Arm, Patient Position: Sitting, Cuff Size: Normal)   Pulse 60   Ht 5' 5 (1.651 m)   Wt 155 lb (70.3 kg)   SpO2 99%   BMI 25.79 kg/m  , BMI Body mass index is 25.79 kg/m.          GEN: Well nourished, well developed, in no acute distress. HEENT: normal. Neck: Supple, no JVD, carotid bruits, or masses. Cardiac: RRR, no murmurs, rubs, or gallops. No clubbing, cyanosis, edema.  Radials 2+/PT 2+ and equal bilaterally.  Respiratory:  Respirations regular and unlabored, clear to auscultation bilaterally. GI: Soft, nontender, nondistended, BS + x 4. MS: no deformity or atrophy. Skin: warm and dry, no rash. Neuro:  Strength and sensation are intact. Psych: Normal affect.  Accessory Clinical Findings    Lab Results  Component Value Date   WBC 7.2 03/08/2022   HGB 11.2 (L) 03/08/2022   HCT  35.2 (L) 03/08/2022   MCV 93.4 03/08/2022   PLT 195 03/08/2022   Lab Results  Component Value Date   CREATININE 0.85 09/05/2021   BUN 20 09/05/2021   NA 138 09/05/2021   K 4.3 09/05/2021   CL 104 09/05/2021   CO2 27 09/05/2021   Lab Results  Component Value Date   ALT 22 09/05/2021   AST 21 09/05/2021   GGT 20 05/03/2020   ALKPHOS 110 09/05/2021   BILITOT 1.5 (H) 09/05/2021   Lab Results  Component Value Date   CHOL 121 09/05/2021   HDL 52 09/05/2021   LDLCALC 57 09/05/2021   TRIG 62 09/05/2021   CHOLHDL 2.3 09/05/2021    No results found for: HGBA1C Lab Results  Component Value Date   TSH 1.250 05/31/2022       Assessment & Plan    1.  Cardiomyopathy: Recent echo performed in the setting of syncope showed an EF of 40 to 45% without regional wall motion abnormalities, grade 1 diastolic dysfunction, normal RV function, and mild MR.  Patient notes good activity tolerance and denies any chest pain or dyspnea.  She is euvolemic on examination.  We discussed her echo results and role of GDMT as well as further evaluation to rule out ischemic causes.  Unfortunately, due to chronically soft blood pressures and a prior history of bradycardia, she is not currently a candidate for beta-blocker or ACEI/ARB/ARNI/MRA.  Notably, she was on lisinopril  in the past and this was discontinued due to hypotension and orthostasis.  We did discuss the addition of an SGLT2 inhibitor and Farxiga  10 mg daily has been prescribed.  She will continue to watch her symptoms of orthostasis.  Follow-up basic metabolic panel today in preparation for coronary CT angiogram to reevaluate coronary artery disease.  2.  Syncope: Syncope without prodrome in November 2025 with electrophysiology follow-up for discussion related to implantable loop recorder  earlier this week.  At this time, patient prefers to hold off on loop recorder placement due to month he cost concerns.  She has had no recurrence of presyncope or  syncope.  We discussed that with any recurrence of syncope we would strongly encourage implantable loop recorder placement.  3.  Coronary artery disease: Previously underwent coronary CT angiogram in 2023 in the setting of left-sided chest pain, which showed moderate, nonobstructive LAD and circumflex disease with normal FFR CT values.  As outlined above, she has not been having any chest pain or dyspnea though in light of LV dysfunction with a midrange ejection fraction of 40-45%, we will pursue repeat coronary CT angiogram to rule out further progression of coronary artery disease.  She remains on aspirin, statin, and low-dose long-acting nitrate therapy.  4.  Primary hypertension/orthostatic hypotension: Denies any presyncope or syncope.  Pressures 100/60 today.  She was 122/80 on Monday.  We had previously recommended discontinuation of isosorbide  therapy though she notes that she continues take half a tablet daily.  5.  Hyperlipidemia: LDL of 65 in November 2024.  She had 2 pieces of toast several hours ago.  Will follow-up lipids this morning.  She remains on atorvastatin  therapy.  6.  Obstructive sleep apnea: On CPAP.  7.  Disposition: Follow-up lab work today and coronary CT angiogram.  Follow-up in clinic in 1 month or sooner if necessary.  Lonni Meager, NP 01/23/2024, 9:59 AM     [1]  Allergies Allergen Reactions   Codeine Nausea And Vomiting, Nausea Only and Other (See Comments)    Severe n & v  codeine  Severe n & v    codeine   Sulfa Antibiotics Nausea And Vomiting and Other (See Comments)    Occurred a long time ago so patient is unsure of reaction  Substance with sulfonamide structure and antibacterial mechanism of action (substance)   Other Other (See Comments)   Sulfasalazine Other (See Comments)   Prednisone Other (See Comments)    Could not sleep and felt nervous.  prednisone   "

## 2024-01-23 NOTE — Patient Instructions (Signed)
 Medication Instructions:   Start Farxiga  10 MG daily.  *If you need a refill on your cardiac medications before your next appointment, please call your pharmacy*  Lab Work: Your provider would like for you to have following labs drawn today BMET and Lipid Panel.    If you have labs (blood work) drawn today and your tests are completely normal, you will receive your results only by: MyChart Message (if you have MyChart) OR A paper copy in the mail If you have any lab test that is abnormal or we need to change your treatment, we will call you to review the results.  Testing/Procedures:   Your cardiac CT will be scheduled at one of the below locations:    [facility] [address] [place], [place] [address] [phone]  If scheduled at [facility], please arrive to the Heart and Vascular Center 15 mins early for check-in and test prep.  There is spacious parking and easy access to the radiology department from the [facility] entrance. Please enter here and check-in with the desk attendant.   Please follow these instructions carefully (unless otherwise directed):  An IV will be required for this test and Nitroglycerin  will be given.  Hold all erectile dysfunction medications at least 3 days (72 hrs) prior to test. (Ie viagra, cialis, sildenafil, tadalafil, etc)   On the Night Before the Test: Be sure to Drink plenty of water. Do not consume any caffeinated/decaffeinated beverages or chocolate 12 hours prior to your test. Do not take any antihistamines 12 hours prior to your test.   On the Day of the Test: Drink plenty of water until 1 hour prior to the test. Do not eat any food 1 hour prior to test. You may take your regular medications prior to the test.  If you take Furosemide/Hydrochlorothiazide/Spironolactone/Chlorthalidone, please HOLD on the morning of the test. Patients who wear a continuous glucose monitor  MUST remove the device prior to scanning. FEMALES- please wear underwire-free bra if available, avoid dresses & tight clothing      After the Test: Drink plenty of water. After receiving IV contrast, you may experience a mild flushed feeling. This is normal. On occasion, you may experience a mild rash up to 24 hours after the test. This is not dangerous. If this occurs, you can take Benadryl 25 mg, Zyrtec, Claritin, or Allegra and increase your fluid intake. (Patients taking Tikosyn should avoid Benadryl, and may take Zyrtec, Claritin, or Allegra) If you experience trouble breathing, this can be serious. If it is severe call 911 IMMEDIATELY. If it is mild, please call our office.  We will call to schedule your test 2-4 weeks out understanding that some insurance companies will need an authorization prior to the service being performed.   For more information and frequently asked questions, please visit our website : [email or web address]  For non-scheduling related questions, please contact the cardiac imaging nurse navigator should you have any questions/concerns: Cardiac Imaging Nurse Navigators Direct Office Dial: [phone]   For scheduling needs, including cancellations and rescheduling, please call Brittany, [phone].   Follow-Up: At [facility], you and your health needs are our priority.  As part of our continuing mission to provide you with exceptional heart care, our providers are all part of one team.  This team includes your primary Cardiologist (physician) and Advanced Practice Providers or APPs (Physician Assistants and Nurse Practitioners) who all work together to provide you with the care  you need, when you need it.  Your next appointment:   1 month(s)  Provider:   Deatrice Cage, MD or Lonni Meager, NP    We recommend signing up for the patient portal called MyChart.  Sign up information is provided on this After Visit Summary.  MyChart  is used to connect with patients for Virtual Visits (Telemedicine).  Patients are able to view lab/test results, encounter notes, upcoming appointments, etc.  Non-urgent messages can be sent to your provider as well.   To learn more about what you can do with MyChart, go to [email or web address].

## 2024-01-24 ENCOUNTER — Ambulatory Visit: Payer: Self-pay | Admitting: Nurse Practitioner

## 2024-01-24 LAB — LIPID PANEL
Chol/HDL Ratio: 1.9 ratio (ref 0.0–4.4)
Cholesterol, Total: 126 mg/dL (ref 100–199)
HDL: 65 mg/dL
LDL Chol Calc (NIH): 51 mg/dL (ref 0–99)
Triglycerides: 37 mg/dL (ref 0–149)
VLDL Cholesterol Cal: 10 mg/dL (ref 5–40)

## 2024-01-24 LAB — BASIC METABOLIC PANEL WITH GFR
BUN/Creatinine Ratio: 15 (ref 12–28)
BUN: 14 mg/dL (ref 8–27)
CO2: 22 mmol/L (ref 20–29)
Calcium: 9 mg/dL (ref 8.7–10.3)
Chloride: 104 mmol/L (ref 96–106)
Creatinine, Ser: 0.95 mg/dL (ref 0.57–1.00)
Glucose: 74 mg/dL (ref 70–99)
Potassium: 4.4 mmol/L (ref 3.5–5.2)
Sodium: 142 mmol/L (ref 134–144)
eGFR: 63 mL/min/1.73

## 2024-01-28 DIAGNOSIS — R55 Syncope and collapse: Secondary | ICD-10-CM | POA: Diagnosis not present

## 2024-02-11 ENCOUNTER — Ambulatory Visit

## 2024-02-25 ENCOUNTER — Ambulatory Visit

## 2024-02-27 ENCOUNTER — Ambulatory Visit: Admitting: Cardiovascular Disease
